# Patient Record
Sex: Female | Born: 1965 | Race: White | Hispanic: No | Marital: Married | Smoking: Never smoker
Health system: Southern US, Community
[De-identification: ages and names within clinical notes are randomized; demographics above are authoritative.]

## PROBLEM LIST (undated history)

## (undated) ENCOUNTER — Emergency Department (HOSPITAL_BASED_OUTPATIENT_CLINIC_OR_DEPARTMENT_OTHER): Admission: EM | Payer: BC Managed Care – PPO | Source: Home / Self Care

## (undated) DIAGNOSIS — F1020 Alcohol dependence, uncomplicated: Secondary | ICD-10-CM

## (undated) DIAGNOSIS — F329 Major depressive disorder, single episode, unspecified: Secondary | ICD-10-CM

## (undated) DIAGNOSIS — D649 Anemia, unspecified: Secondary | ICD-10-CM

## (undated) DIAGNOSIS — F32A Depression, unspecified: Secondary | ICD-10-CM

## (undated) DIAGNOSIS — K589 Irritable bowel syndrome without diarrhea: Secondary | ICD-10-CM

## (undated) DIAGNOSIS — F319 Bipolar disorder, unspecified: Secondary | ICD-10-CM

## (undated) DIAGNOSIS — E785 Hyperlipidemia, unspecified: Secondary | ICD-10-CM

## (undated) DIAGNOSIS — K219 Gastro-esophageal reflux disease without esophagitis: Secondary | ICD-10-CM

## (undated) DIAGNOSIS — D099 Carcinoma in situ, unspecified: Secondary | ICD-10-CM

## (undated) DIAGNOSIS — T7840XA Allergy, unspecified, initial encounter: Secondary | ICD-10-CM

## (undated) DIAGNOSIS — F419 Anxiety disorder, unspecified: Secondary | ICD-10-CM

## (undated) DIAGNOSIS — E039 Hypothyroidism, unspecified: Secondary | ICD-10-CM

## (undated) DIAGNOSIS — F603 Borderline personality disorder: Secondary | ICD-10-CM

## (undated) HISTORY — PX: EYE SURGERY: SHX253

## (undated) HISTORY — DX: Alcohol dependence, uncomplicated: F10.20

## (undated) HISTORY — DX: Anemia, unspecified: D64.9

## (undated) HISTORY — DX: Borderline personality disorder: F60.3

## (undated) HISTORY — DX: Depression, unspecified: F32.A

## (undated) HISTORY — DX: Irritable bowel syndrome without diarrhea: K58.9

## (undated) HISTORY — PX: WISDOM TOOTH EXTRACTION: SHX21

## (undated) HISTORY — DX: Carcinoma in situ, unspecified: D09.9

## (undated) HISTORY — PX: FOOT SURGERY: SHX648

## (undated) HISTORY — PX: HERNIA REPAIR: SHX51

## (undated) HISTORY — DX: Bipolar disorder, unspecified: F31.9

## (undated) HISTORY — DX: Allergy, unspecified, initial encounter: T78.40XA

## (undated) HISTORY — PX: SQUAMOUS CELL CARCINOMA EXCISION: SHX2433

## (undated) HISTORY — DX: Gastro-esophageal reflux disease without esophagitis: K21.9

## (undated) HISTORY — DX: Hyperlipidemia, unspecified: E78.5

## (undated) HISTORY — DX: Anxiety disorder, unspecified: F41.9

## (undated) HISTORY — DX: Hypothyroidism, unspecified: E03.9

## (undated) HISTORY — PX: APPENDECTOMY: SHX54

## (undated) HISTORY — DX: Major depressive disorder, single episode, unspecified: F32.9

---

## 1997-06-04 ENCOUNTER — Ambulatory Visit (HOSPITAL_COMMUNITY): Admission: RE | Admit: 1997-06-04 | Discharge: 1997-06-04 | Payer: Self-pay | Admitting: Psychiatry

## 1997-12-27 ENCOUNTER — Other Ambulatory Visit: Admission: RE | Admit: 1997-12-27 | Discharge: 1997-12-27 | Payer: Self-pay | Admitting: Obstetrics and Gynecology

## 1998-03-26 ENCOUNTER — Inpatient Hospital Stay (HOSPITAL_COMMUNITY): Admission: AD | Admit: 1998-03-26 | Discharge: 1998-03-28 | Payer: Self-pay | Admitting: Psychiatry

## 1999-03-20 ENCOUNTER — Other Ambulatory Visit: Admission: RE | Admit: 1999-03-20 | Discharge: 1999-03-20 | Payer: Self-pay | Admitting: Obstetrics and Gynecology

## 1999-09-27 ENCOUNTER — Other Ambulatory Visit: Admission: RE | Admit: 1999-09-27 | Discharge: 1999-10-05 | Payer: Self-pay | Admitting: Psychiatry

## 2000-06-24 ENCOUNTER — Other Ambulatory Visit: Admission: RE | Admit: 2000-06-24 | Discharge: 2000-06-24 | Payer: Self-pay | Admitting: Obstetrics and Gynecology

## 2001-10-08 ENCOUNTER — Encounter (INDEPENDENT_AMBULATORY_CARE_PROVIDER_SITE_OTHER): Payer: Self-pay | Admitting: Specialist

## 2001-10-08 ENCOUNTER — Observation Stay (HOSPITAL_COMMUNITY): Admission: EM | Admit: 2001-10-08 | Discharge: 2001-10-08 | Payer: Self-pay | Admitting: Emergency Medicine

## 2001-10-08 ENCOUNTER — Encounter: Payer: Self-pay | Admitting: Emergency Medicine

## 2002-01-02 ENCOUNTER — Inpatient Hospital Stay (HOSPITAL_COMMUNITY): Admission: EM | Admit: 2002-01-02 | Discharge: 2002-01-05 | Payer: Self-pay | Admitting: Psychiatry

## 2002-05-06 ENCOUNTER — Other Ambulatory Visit: Admission: RE | Admit: 2002-05-06 | Discharge: 2002-05-06 | Payer: Self-pay | Admitting: Obstetrics and Gynecology

## 2003-09-15 ENCOUNTER — Other Ambulatory Visit: Admission: RE | Admit: 2003-09-15 | Discharge: 2003-09-15 | Payer: Self-pay | Admitting: Obstetrics and Gynecology

## 2004-04-21 ENCOUNTER — Ambulatory Visit: Payer: Self-pay | Admitting: Family Medicine

## 2005-01-22 ENCOUNTER — Other Ambulatory Visit: Admission: RE | Admit: 2005-01-22 | Discharge: 2005-01-22 | Payer: Self-pay | Admitting: Obstetrics and Gynecology

## 2005-10-17 ENCOUNTER — Ambulatory Visit: Payer: Self-pay | Admitting: Family Medicine

## 2005-11-12 ENCOUNTER — Ambulatory Visit: Payer: Self-pay | Admitting: Internal Medicine

## 2005-11-15 ENCOUNTER — Ambulatory Visit: Payer: Self-pay | Admitting: Cardiology

## 2005-12-07 ENCOUNTER — Ambulatory Visit: Payer: Self-pay | Admitting: Internal Medicine

## 2007-06-24 ENCOUNTER — Ambulatory Visit: Payer: Self-pay | Admitting: Family Medicine

## 2007-06-24 DIAGNOSIS — R197 Diarrhea, unspecified: Secondary | ICD-10-CM | POA: Insufficient documentation

## 2007-06-24 LAB — CONVERTED CEMR LAB: Tissue Transglutaminase Ab, IgA: 1.2 units (ref <7)

## 2007-07-03 ENCOUNTER — Encounter (INDEPENDENT_AMBULATORY_CARE_PROVIDER_SITE_OTHER): Payer: Self-pay | Admitting: *Deleted

## 2007-07-09 ENCOUNTER — Telehealth: Payer: Self-pay | Admitting: Family Medicine

## 2007-07-15 ENCOUNTER — Ambulatory Visit: Payer: Self-pay | Admitting: Family Medicine

## 2007-07-17 LAB — CONVERTED CEMR LAB
ALT: 13 units/L (ref 0–35)
AST: 16 units/L (ref 0–37)
Albumin: 3.7 g/dL (ref 3.5–5.2)
Alkaline Phosphatase: 38 units/L — ABNORMAL LOW (ref 39–117)
BUN: 11 mg/dL (ref 6–23)
Basophils Absolute: 0 10*3/uL (ref 0.0–0.1)
Basophils Relative: 0.5 % (ref 0.0–1.0)
Bilirubin, Direct: 0.1 mg/dL (ref 0.0–0.3)
CO2: 28 meq/L (ref 19–32)
Calcium: 9.7 mg/dL (ref 8.4–10.5)
Chloride: 101 meq/L (ref 96–112)
Creatinine, Ser: 0.8 mg/dL (ref 0.4–1.2)
Eosinophils Absolute: 0.1 10*3/uL (ref 0.0–0.7)
Eosinophils Relative: 1.7 % (ref 0.0–5.0)
GFR calc Af Amer: 102 mL/min
GFR calc non Af Amer: 84 mL/min
Glucose, Bld: 124 mg/dL — ABNORMAL HIGH (ref 70–99)
HCT: 36.4 % (ref 36.0–46.0)
Hemoglobin: 12.9 g/dL (ref 12.0–15.0)
Lymphocytes Relative: 27.6 % (ref 12.0–46.0)
MCHC: 35.4 g/dL (ref 30.0–36.0)
MCV: 94.4 fL (ref 78.0–100.0)
Monocytes Absolute: 0.3 10*3/uL (ref 0.1–1.0)
Monocytes Relative: 8.6 % (ref 3.0–12.0)
Neutro Abs: 2.5 10*3/uL (ref 1.4–7.7)
Neutrophils Relative %: 61.6 % (ref 43.0–77.0)
Platelets: 243 10*3/uL (ref 150–400)
Potassium: 4 meq/L (ref 3.5–5.1)
RBC: 3.85 M/uL — ABNORMAL LOW (ref 3.87–5.11)
RDW: 12.4 % (ref 11.5–14.6)
Sed Rate: 6 mm/hr (ref 0–22)
Sodium: 140 meq/L (ref 135–145)
TSH: 3.16 microintl units/mL (ref 0.35–5.50)
Total Bilirubin: 1 mg/dL (ref 0.3–1.2)
Total Protein: 6.9 g/dL (ref 6.0–8.3)
WBC: 4 10*3/uL — ABNORMAL LOW (ref 4.5–10.5)

## 2007-08-07 ENCOUNTER — Encounter: Payer: Self-pay | Admitting: Family Medicine

## 2007-08-12 LAB — HM MAMMOGRAPHY: HM Mammogram: NORMAL

## 2008-01-06 ENCOUNTER — Ambulatory Visit: Payer: Self-pay | Admitting: Family Medicine

## 2008-01-06 DIAGNOSIS — N760 Acute vaginitis: Secondary | ICD-10-CM | POA: Insufficient documentation

## 2008-01-06 LAB — CONVERTED CEMR LAB: KOH Prep: 9

## 2008-01-07 ENCOUNTER — Ambulatory Visit: Payer: Self-pay | Admitting: Family Medicine

## 2008-01-12 LAB — CONVERTED CEMR LAB
ALT: 14 units/L (ref 0–35)
AST: 14 units/L (ref 0–37)
Albumin: 3.7 g/dL (ref 3.5–5.2)
Alkaline Phosphatase: 42 units/L (ref 39–117)
BUN: 11 mg/dL (ref 6–23)
Basophils Absolute: 0 10*3/uL (ref 0.0–0.1)
Basophils Relative: 0 % (ref 0.0–3.0)
Bilirubin, Direct: 0.1 mg/dL (ref 0.0–0.3)
CO2: 31 meq/L (ref 19–32)
Calcium: 9.6 mg/dL (ref 8.4–10.5)
Chloride: 104 meq/L (ref 96–112)
Cholesterol: 184 mg/dL (ref 0–200)
Creatinine, Ser: 0.6 mg/dL (ref 0.4–1.2)
Eosinophils Absolute: 0.1 10*3/uL (ref 0.0–0.7)
Eosinophils Relative: 2.3 % (ref 0.0–5.0)
GFR calc Af Amer: 142 mL/min
GFR calc non Af Amer: 117 mL/min
Glucose, Bld: 82 mg/dL (ref 70–99)
HCT: 37.6 % (ref 36.0–46.0)
HDL: 61 mg/dL (ref >39.0)
Hemoglobin: 12.7 g/dL (ref 12.0–15.0)
LDL Cholesterol: 101 mg/dL — ABNORMAL HIGH (ref 0–99)
Lymphocytes Relative: 31.6 % (ref 12.0–46.0)
MCHC: 33.8 g/dL (ref 30.0–36.0)
MCV: 94.9 fL (ref 78.0–100.0)
Monocytes Absolute: 0.5 10*3/uL (ref 0.1–1.0)
Monocytes Relative: 9.7 % (ref 3.0–12.0)
Neutro Abs: 2.7 10*3/uL (ref 1.4–7.7)
Neutrophils Relative %: 56.4 % (ref 43.0–77.0)
Platelets: 211 10*3/uL (ref 150–400)
Potassium: 4.3 meq/L (ref 3.5–5.1)
RBC: 3.96 M/uL (ref 3.87–5.11)
RDW: 11.9 % (ref 11.5–14.6)
Sodium: 140 meq/L (ref 135–145)
TSH: 3.41 microintl units/mL (ref 0.35–5.50)
Total Bilirubin: 0.7 mg/dL (ref 0.3–1.2)
Total CHOL/HDL Ratio: 3
Total Protein: 7.1 g/dL (ref 6.0–8.3)
Triglycerides: 109 mg/dL (ref 0–149)
VLDL: 22 mg/dL (ref 0–40)
WBC: 4.8 10*3/uL (ref 4.5–10.5)

## 2008-01-27 ENCOUNTER — Encounter: Payer: Self-pay | Admitting: Family Medicine

## 2008-01-29 ENCOUNTER — Ambulatory Visit: Payer: Self-pay | Admitting: Family Medicine

## 2008-01-29 DIAGNOSIS — E663 Overweight: Secondary | ICD-10-CM | POA: Insufficient documentation

## 2008-01-29 DIAGNOSIS — R5383 Other fatigue: Secondary | ICD-10-CM

## 2008-01-29 DIAGNOSIS — R5381 Other malaise: Secondary | ICD-10-CM | POA: Insufficient documentation

## 2008-02-02 LAB — CONVERTED CEMR LAB
Folate: >20 ng/mL
Free T4: 0.7 ng/dL (ref 0.6–1.6)
T3, Free: 3.2 pg/mL (ref 2.3–4.2)
Vitamin B-12: 426 pg/mL (ref 211–911)

## 2008-02-03 LAB — CONVERTED CEMR LAB: Vit D, 1,25-Dihydroxy: 30 (ref 30–89)

## 2008-03-12 ENCOUNTER — Ambulatory Visit: Payer: Self-pay | Admitting: Family Medicine

## 2008-03-12 LAB — CONVERTED CEMR LAB
Free T4: 0.8 ng/dL (ref 0.6–1.6)
T3, Free: 2.9 pg/mL (ref 2.3–4.2)
TSH: 2.85 microintl units/mL (ref 0.35–5.50)

## 2008-08-23 ENCOUNTER — Ambulatory Visit: Payer: Self-pay | Admitting: Family Medicine

## 2008-08-23 DIAGNOSIS — B373 Candidiasis of vulva and vagina: Secondary | ICD-10-CM | POA: Insufficient documentation

## 2008-08-23 LAB — CONVERTED CEMR LAB: KOH Prep: POSITIVE

## 2009-04-20 ENCOUNTER — Encounter (INDEPENDENT_AMBULATORY_CARE_PROVIDER_SITE_OTHER): Payer: Self-pay | Admitting: *Deleted

## 2009-04-20 ENCOUNTER — Telehealth: Payer: Self-pay | Admitting: Gastroenterology

## 2009-04-20 ENCOUNTER — Ambulatory Visit: Payer: Self-pay | Admitting: Family Medicine

## 2009-04-21 ENCOUNTER — Encounter: Payer: Self-pay | Admitting: Physician Assistant

## 2009-04-21 ENCOUNTER — Ambulatory Visit: Payer: Self-pay | Admitting: Gastroenterology

## 2009-04-21 DIAGNOSIS — R1084 Generalized abdominal pain: Secondary | ICD-10-CM | POA: Insufficient documentation

## 2009-04-21 DIAGNOSIS — K219 Gastro-esophageal reflux disease without esophagitis: Secondary | ICD-10-CM

## 2009-04-21 DIAGNOSIS — F329 Major depressive disorder, single episode, unspecified: Secondary | ICD-10-CM | POA: Insufficient documentation

## 2009-04-21 DIAGNOSIS — R195 Other fecal abnormalities: Secondary | ICD-10-CM | POA: Insufficient documentation

## 2009-04-21 DIAGNOSIS — F411 Generalized anxiety disorder: Secondary | ICD-10-CM | POA: Insufficient documentation

## 2009-04-21 DIAGNOSIS — R109 Unspecified abdominal pain: Secondary | ICD-10-CM | POA: Insufficient documentation

## 2009-04-21 HISTORY — DX: Gastro-esophageal reflux disease without esophagitis: K21.9

## 2009-04-21 HISTORY — DX: Generalized anxiety disorder: F41.1

## 2009-04-22 LAB — CONVERTED CEMR LAB
Basophils Absolute: 0 10*3/uL (ref 0.0–0.1)
Basophils Relative: 1 % (ref 0.0–3.0)
CRP, High Sensitivity: 0.4 (ref 0.00–5.00)
Eosinophils Absolute: 0.1 10*3/uL (ref 0.0–0.7)
Eosinophils Relative: 2.8 % (ref 0.0–5.0)
HCT: 38 % (ref 36.0–46.0)
Hemoglobin: 12.6 g/dL (ref 12.0–15.0)
Lymphocytes Relative: 42.2 % (ref 12.0–46.0)
Lymphs Abs: 1.8 10*3/uL (ref 0.7–4.0)
MCHC: 33.2 g/dL (ref 30.0–36.0)
MCV: 98.2 fL (ref 78.0–100.0)
Monocytes Absolute: 0.4 10*3/uL (ref 0.1–1.0)
Monocytes Relative: 10.7 % (ref 3.0–12.0)
Neutro Abs: 1.7 10*3/uL (ref 1.4–7.7)
Neutrophils Relative %: 43.3 % (ref 43.0–77.0)
Platelets: 218 10*3/uL (ref 150.0–400.0)
RBC: 3.87 M/uL (ref 3.87–5.11)
RDW: 12.1 % (ref 11.5–14.6)
WBC: 4 10*3/uL — ABNORMAL LOW (ref 4.5–10.5)

## 2009-04-25 LAB — CONVERTED CEMR LAB: Tissue Transglutaminase Ab, IgA: 1.1 units (ref <7)

## 2009-05-03 ENCOUNTER — Ambulatory Visit: Payer: Self-pay | Admitting: Gastroenterology

## 2009-05-03 HISTORY — PX: UPPER GASTROINTESTINAL ENDOSCOPY: SHX188

## 2009-05-03 HISTORY — PX: COLONOSCOPY: SHX174

## 2009-05-03 LAB — HM COLONOSCOPY

## 2009-05-05 ENCOUNTER — Encounter: Payer: Self-pay | Admitting: Gastroenterology

## 2009-05-11 ENCOUNTER — Ambulatory Visit: Payer: Self-pay | Admitting: Family Medicine

## 2009-05-11 ENCOUNTER — Telehealth: Payer: Self-pay | Admitting: Gastroenterology

## 2009-05-17 ENCOUNTER — Ambulatory Visit: Payer: Self-pay | Admitting: Gastroenterology

## 2009-05-17 DIAGNOSIS — K589 Irritable bowel syndrome without diarrhea: Secondary | ICD-10-CM | POA: Insufficient documentation

## 2009-05-17 LAB — CONVERTED CEMR LAB
ALT: 13 units/L (ref 0–35)
AST: 14 units/L (ref 0–37)
Albumin: 3.7 g/dL (ref 3.5–5.2)
Alkaline Phosphatase: 57 units/L (ref 39–117)
BUN: 9 mg/dL (ref 6–23)
Basophils Absolute: 0 10*3/uL (ref 0.0–0.1)
Basophils Relative: 0.5 % (ref 0.0–3.0)
Bilirubin, Direct: 0 mg/dL (ref 0.0–0.3)
CO2: 32 meq/L (ref 19–32)
Calcium: 9 mg/dL (ref 8.4–10.5)
Chloride: 101 meq/L (ref 96–112)
Creatinine, Ser: 0.5 mg/dL (ref 0.4–1.2)
Eosinophils Absolute: 0.2 10*3/uL (ref 0.0–0.7)
Eosinophils Relative: 4.3 % (ref 0.0–5.0)
Ferritin: 24 ng/mL (ref 10.0–291.0)
Folate: 17.3 ng/mL
GFR calc non Af Amer: 142.95 mL/min (ref >60)
Glucose, Bld: 88 mg/dL (ref 70–99)
HCT: 34.7 % — ABNORMAL LOW (ref 36.0–46.0)
Hemoglobin: 12.1 g/dL (ref 12.0–15.0)
Iron: 37 ug/dL — ABNORMAL LOW (ref 42–145)
Lymphocytes Relative: 42.7 % (ref 12.0–46.0)
Lymphs Abs: 2.1 10*3/uL (ref 0.7–4.0)
MCHC: 34.9 g/dL (ref 30.0–36.0)
MCV: 96 fL (ref 78.0–100.0)
Monocytes Absolute: 0.5 10*3/uL (ref 0.1–1.0)
Monocytes Relative: 9.8 % (ref 3.0–12.0)
Neutro Abs: 2.1 10*3/uL (ref 1.4–7.7)
Neutrophils Relative %: 42.7 % — ABNORMAL LOW (ref 43.0–77.0)
Platelets: 256 10*3/uL (ref 150.0–400.0)
Potassium: 3.7 meq/L (ref 3.5–5.1)
RBC: 3.61 M/uL — ABNORMAL LOW (ref 3.87–5.11)
RDW: 12.7 % (ref 11.5–14.6)
Saturation Ratios: 10 % — ABNORMAL LOW (ref 20.0–50.0)
Sodium: 139 meq/L (ref 135–145)
TSH: 3.04 microintl units/mL (ref 0.35–5.50)
Total Bilirubin: 0.2 mg/dL — ABNORMAL LOW (ref 0.3–1.2)
Total Protein: 6.7 g/dL (ref 6.0–8.3)
Transferrin: 265.1 mg/dL (ref 212.0–360.0)
Vitamin B-12: 532 pg/mL (ref 211–911)
WBC: 5 10*3/uL (ref 4.5–10.5)

## 2009-05-18 ENCOUNTER — Encounter: Payer: Self-pay | Admitting: Gastroenterology

## 2009-05-31 ENCOUNTER — Ambulatory Visit: Payer: Self-pay | Admitting: Gastroenterology

## 2010-03-16 NOTE — Letter (Signed)
Summary: [facility] Biopsy Results  [facility]  [address] [place], [place] [address]   Phone: [phone]  Fax: [phone]        May 05, 2009 MRN: [id number]    [facility] [address] [place], [place]  [address]    Dear Diana Decker,  I am pleased to inform you that the biopsies taken during your recent endoscopic examination did not show any evidence of cancer upon pathologic examination.  Additional information/recommendations:  __No further action is needed at this time.  Please follow-up with      your primary care physician for your other healthcare needs.  _xx_ Please call [phone] to schedule a return visit to review      your condition.  __ Continue with the treatment plan as outlined on the day of your      exam.  __ You should have a repeat endoscopic examination for this problem              in _ months/years.   Please call us if you are having persistent problems or have questions about your condition that have not been fully answered at this time.  Sincerely,  Mardella Layman MD [facility]  This letter has been electronically signed by your physician.  Appended Document: Patient Notice-Endo Biopsy Results Letter mailed 3.25.11

## 2010-03-16 NOTE — Assessment & Plan Note (Signed)
Summary: 2 WK F/U-ALP   History of Present Illness Visit Type: Follow-up Visit Primary GI MD: Sheryn Bison MD FACP FAGA Primary Provider: Kerby Nora, MD  Requesting Provider: n/a Chief Complaint: 2 wk f/u with diarrhea. Pt has changed her diet which has improved her symptoms. Pt is still taking the Align and when she eats the wrong food she will have more diarrhea.  History of Present Illness:   All of her stool exams were normal. She currently is on a low-fat diet with probiotic therapy and has had marked improvement in her diarrhea. She gets constipated she uses Imodium or Levsin. I gave her information concerning Lotronex to review and she does not want to take this medication.   GI Review of Systems    Reports abdominal pain.     Location of  Abdominal pain: right side.    Denies acid reflux, belching, bloating, chest pain, dysphagia with liquids, dysphagia with solids, heartburn, loss of appetite, nausea, vomiting, vomiting blood, weight loss, and  weight gain.      Reports diarrhea.     Denies anal fissure, black tarry stools, change in bowel habit, constipation, diverticulosis, fecal incontinence, heme positive stool, hemorrhoids, irritable bowel syndrome, jaundice, light color stool, liver problems, rectal bleeding, and  rectal pain.    Current Medications (verified): 1)  Depakote 500 Mg  Tbec (Divalproex Sodium) .... Take 1 Tablet By Mouth Once A Day 2)  Lamictal 25 Mg  Tbdp (Lamotrigine) .... Take 1 Tablet By Mouth Once A Day 3)  Valtrex 1 Gm  Tabs (Valacyclovir Hcl) .... Takes 1/2 By Mouth Once Daily 4)  Multivitamins   Tabs (Multiple Vitamin) .... Take 1 Tablet By Mouth Once A Day 5)  Serzone 200 Mg. .... Take 1 Tablet By Mouth Once A Day 6)  Hyoscyamine Sulfate 0.125 Mg Tabs (Hyoscyamine Sulfate) .Marland Kitchen.. 1 By Mouth 4 Times Daily As Needed Abdominal Discomfort / Diarrhea 7)  Align  Caps (Probiotic Product) .... One Capsule By Mouth Once Daily  Allergies (verified): No  Known Drug Allergies  Past History:  Past medical, surgical, family and social histories (including risk factors) reviewed for relevance to current acute and chronic problems.  Past Medical History: Reviewed history from 04/21/2009 and no changes required. Bipolar Disorder 1994 Borderline Personality Disorder 1997 Anemia 2000 IBS GERD, ocassional Alcoholism Anemia Anxiety Disorder  Past Surgical History: Reviewed history from 08/28/2007 and no changes required. Appendectomy Inguinal hernia repair  Family History: Reviewed history from 04/21/2009 and no changes required. Breast Cancer-Mother MOTHER ;CELAC DISEASE Adenomatous colon Polyps-Father--and prostate cancer, peripherial neuropathy Family History of Heart Disease: Father No FH of Colon Cancer:  Social History: Reviewed history from 01/06/2008 and no changes required. Never Smoked Alcohol use-yes Regular exercise-yes Marital Status: Married--second marriage Children: no biological children, 2 step Occupation: Teaching laboratory technician artist--photophrography, [employer]  Review of Systems  The patient denies allergy/sinus, anemia, anxiety-new, arthritis/joint pain, back pain, blood in urine, breast changes/lumps, change in vision, confusion, cough, coughing up blood, depression-new, fainting, fatigue, fever, headaches-new, hearing problems, heart murmur, heart rhythm changes, itching, menstrual pain, muscle pains/cramps, night sweats, nosebleeds, pregnancy symptoms, shortness of breath, skin rash, sleeping problems, sore throat, swelling of feet/legs, swollen lymph glands, thirst - excessive , urination - excessive , urination changes/pain, urine leakage, vision changes, and voice change.    Vital Signs:  Patient profile:   45 year old female Height:      67 inches Weight:      185.38 pounds BMI:  29.14 Pulse rate:   70 / minute Pulse rhythm:   regular BP sitting:   116 / 72  (right arm) Cuff size:    regular  Vitals Entered By: Christie Nottingham CMA Duncan Dull) (May 31, 2009 4:10 PM)  Physical Exam  General:  Well developed, well nourished, no acute distress.healthy appearing.   Head:  Normocephalic and atraumatic. Eyes:  PERRLA, no icterus.exam deferred to patient's ophthalmologist.   Abdomen:  Soft, nontender and nondistended. No masses, hepatosplenomegaly or hernias noted. Normal bowel sounds. Psych:  Alert and cooperative. Normal mood and affect.   Impression & Recommendations:  Problem # 1:  IBS (ICD-564.1) Assessment Improved Continue probiotic therapy and low fat nutritious diet as tolerated. I do not think further GI workup at this time indicated. She has seen our patient education video on IBS and its management. Other considerations would be a two-week course of Xifaxan therapy for possible bacterial overgrowth syndrome.  Problem # 2:  ANXIETY (ICD-300.00) Assessment: Improved  Problem # 3:  DIARRHEA, CHRONIC (ICD-787.91) Assessment: Improved  Patient Instructions: 1)  Begin [facility] Colon Health daily. 2)  Please continue current medications.  3)  The medication list was reviewed and reconciled.  All changed / newly prescribed medications were explained.  A complete medication list was provided to the patient / caregiver. 4)  IBS brochure given.  5)  Copy sent to : Dr. Kerby Nora  Appended Document: 2 WK F/U-ALP    Clinical Lists Changes  Medications: Changed medication from ALIGN  CAPS (PROBIOTIC PRODUCT) one capsule by mouth once daily to [facility] COLON HEALTH  CAPS (PROBIOTIC PRODUCT) two times a day

## 2010-03-16 NOTE — Letter (Signed)
Summary: [facility] Instructions  [facility]  [address] [place], [place] [address]   Phone: [phone]  Fax: [phone]       Diana Decker    02/05/1966    MRN: [id number]        Procedure Day /Date: 05-03-09     Arrival Time: 9:30 AM      Procedure Time:10:30 AM     Location of Procedure:                    X    [facility] (4th Floor)                     PREPARATION FOR COLONOSCOPY WITH MOVIPREP   Starting 5 days prior to your procedure 04-28-09  do not eat nuts, seeds, popcorn, corn, beans, peas,  salads, or any raw vegetables.  Do not take any fiber supplements (e.g. Metamucil, Citrucel, and Benefiber).  THE DAY BEFORE YOUR PROCEDURE         DATE: 05-02-09  DAY: Monday  1.  Drink clear liquids the entire day-NO SOLID FOOD  2.  Do not drink anything colored red or purple.  Avoid juices with pulp.  No orange juice.  3.  Drink at least 64 oz. (8 glasses) of fluid/clear liquids during the day to prevent dehydration and help the prep work efficiently.  CLEAR LIQUIDS INCLUDE: Water Jello Ice Popsicles Tea (sugar ok, no milk/cream) Powdered fruit flavored drinks Coffee (sugar ok, no milk/cream) Gatorade Juice: apple, white grape, white cranberry  Lemonade Clear bullion, consomm, broth Carbonated beverages (any kind) Strained chicken noodle soup Hard Candy                             4.  In the morning, mix first dose of MoviPrep solution:    Empty 1 Pouch A and 1 Pouch B into the disposable container    Add lukewarm drinking water to the top line of the container. Mix to dissolve    Refrigerate (mixed solution should be used within 24 hrs)  5.  Begin drinking the prep at 5:00 p.m. The MoviPrep container is divided by 4 marks.   Every 15 minutes drink the solution down to the next mark (approximately 8 oz) until the full liter is complete.   6.  Follow completed prep with 16 oz of clear liquid of your choice (Nothing red or  purple).  Continue to drink clear liquids until bedtime.  7.  Before going to bed, mix second dose of MoviPrep solution:    Empty 1 Pouch A and 1 Pouch B into the disposable container    Add lukewarm drinking water to the top line of the container. Mix to dissolve    Refrigerate  THE DAY OF YOUR PROCEDURE      DATE: 05-03-09 DAY: Tuesday  Beginning at 5:30 AM (5 hours before procedure):         1. Every 15 minutes, drink the solution down to the next mark (approx 8 oz) until the full liter is complete.  2. Follow completed prep with 16 oz. of clear liquid of your choice.    3. You may drink clear liquids until 8:30 AM (2 HOURS BEFORE PROCEDURE).   MEDICATION INSTRUCTIONS  Unless otherwise instructed, you should take regular prescription medications with a small sip of water   as early as possible the morning of your procedure.  OTHER INSTRUCTIONS  You will need a responsible adult at least 44 years of age to accompany you and drive you home.   This person must remain in the waiting room during your procedure.  Wear loose fitting clothing that is easily removed.  Leave jewelry and other valuables at home.  However, you may wish to bring a book to read or  an iPod/MP3 player to listen to music as you wait for your procedure to start.  Remove all body piercing jewelry and leave at home.  Total time from sign-in until discharge is approximately 2-3 hours.  You should go home directly after your procedure and rest.  You can resume normal activities the  day after your procedure.  The day of your procedure you should not:   Drive   Make legal decisions   Operate machinery   Drink alcohol   Return to work  You will receive specific instructions about eating, activities and medications before you leave.    The above instructions have been reviewed and explained to me by   _______________________    I fully understand and can verbalize these instructions  _____________________________ Date _________

## 2010-03-16 NOTE — Letter (Signed)
Summary: New Patient letter  [facility]  [address] [place], [place] [address]   Phone: [phone]  Fax: [phone]       04/20/2009 MRN: [id number]  [facility] [address] [place], [place]  [address]  Dear Diana Decker,  Welcome to the Gastroenterology Division at [facility].    You are scheduled to see Dr.  Russella Dar on 05-23-09 at  10:15AM on the 3rd floor at [facility], [address]  We ask that you try to arrive at our office 15 minutes prior to your appointment time to allow for check-in.  We would like you to complete the enclosed self-administered evaluation form prior to your visit and bring it with you on the day of your appointment.  We will review it with you.  Also, please bring a complete list of all your medications or, if you prefer, bring the medication bottles and we will list them.  Please bring your insurance card so that we may make a copy of it.  If your insurance requires a referral to see a specialist, please bring your referral form from your primary care physician.  Co-payments are due at the time of your visit and may be paid by cash, check or credit card.     Your office visit will consist of a consult with your physician (includes a physical exam), any laboratory testing he/she may order, scheduling of any necessary diagnostic testing (e.g. x-ray, ultrasound, CT-scan), and scheduling of a procedure (e.g. Endoscopy, Colonoscopy) if required.  Please allow enough time on your schedule to allow for any/all of these possibilities.    If you cannot keep your appointment, please call [phone] to cancel or reschedule prior to your appointment date.  This allows [place] the opportunity to schedule an appointment for another patient in need of care.  If you do not cancel or reschedule by 5 p.m. the business day prior to your appointment date, you will be charged a $50.00 late cancellation/no-show fee.    Thank you for  choosing [facility] for your medical needs.  We appreciate the opportunity to care for you.  Please visit [place] at our website  to learn more about our practice.                     Sincerely,                                                             The Gastroenterology Division

## 2010-03-16 NOTE — Assessment & Plan Note (Signed)
Summary: Diarrhea,abd pain/dfs   History of Present Illness Visit Type: new patient  Primary GI MD: Sheryn Bison MD FACP FAGA Primary Provider: Kerby Nora, MD  Requesting Provider: n/a Chief Complaint: Diarrhea, bloating,  and lower abd pain  History of Present Illness:   45 YO FEMALE ,NEW TODAY. SHE REPORTS HAVING SEEN A GI MD  SEVERAL YEARS AGO,WAS TO HAVE A COLONOSCOPY BUT DID NOT FOLLOW THRU. SHE HAS HAD ONGOING PROBLEMS WITH DIARRHEA-FREQUENT,LOOSE STOOLS USUALLY 6-7 BMS/DAY,AGGRAVATED BY by mouth INTAKE.OVER THIS PAST WEEK SHE HAS DEVELOPED SHARP,CRAMPY MID ABDOMINAL PAINS WHICH ARE NEW. NO FEVER, NO N/V. APPETITE OK, WEIGHT STABLE. SHE ALSO RELATES CHRONIC PROBLEMS WITH BLOATING. HER LOOSE STOOLS HAVE BEEN PRESENT FOR A COUPLE OF YEARS. HER MOTHER HAS CELIAC DISEASE. PT SAYS SHE HAD SOME MARKERS DONE  A FEW YEARS AGO, AND ONE WAS POSITIVE-SHE TRIED A GLUTEN FREE DIET SHORT TERM AND DID FEEL BETTER THOUGH NOT DRAMATICALLY SO-AND DID NOT STICK WITH IT.   GI Review of Systems    Reports abdominal pain and  bloating.     Location of  Abdominal pain: lower abdomen.    Denies acid reflux, belching, chest pain, dysphagia with liquids, dysphagia with solids, heartburn, loss of appetite, nausea, vomiting, vomiting blood, and  weight loss.      Reports diarrhea.     Denies anal fissure, black tarry stools, constipation, fecal incontinence, heme positive stool, hemorrhoids, irritable bowel syndrome, jaundice, light color stool, liver problems, rectal bleeding, and  rectal pain.    Current Medications (verified): 1)  Depakote 500 Mg  Tbec (Divalproex Sodium) .... Take 1 Tablet By Mouth Once A Day 2)  Lamictal 25 Mg  Tbdp (Lamotrigine) .... Take 1 Tablet By Mouth Once A Day 3)  Valtrex 1 Gm  Tabs (Valacyclovir Hcl) .... Takes 1/2 By Mouth Once Daily 4)  Multivitamins   Tabs (Multiple Vitamin) .... Take 1 Tablet By Mouth Once A Day 5)  Serzone 200 Mg. .... Take 1 Tablet By Mouth Once A Day 6)   Hyoscyamine Sulfate 0.125 Mg Tabs (Hyoscyamine Sulfate) .Marland Kitchen.. 1 By Mouth 4 Times Daily As Needed Abdominal Discomfort / Diarrhea  Allergies (verified): No Known Drug Allergies  Past History:  Past Medical History: Bipolar Disorder 1994 Borderline Personality Disorder 1997 Anemia 2000 IBS GERD, ocassional Alcoholism Anemia Anxiety Disorder  Past Surgical History: Reviewed history from 08/28/2007 and no changes required. Appendectomy Inguinal hernia repair  Family History: Breast Cancer-Mother MOTHER ;CELAC DISEASE Adenomatous colon Polyps-Father--and prostate cancer, peripherial neuropathy Family History of Heart Disease: Father No FH of Colon Cancer:  Social History: Reviewed history from 01/06/2008 and no changes required. Never Smoked Alcohol use-yes Regular exercise-yes Marital Status: Married--second marriage Children: no biological children, 2 step Occupation: Teaching laboratory technician artist--photophrography, [employer]  Review of Systems       The patient complains of fatigue and menstrual pain.  The patient denies allergy/sinus, anemia, anxiety-new, arthritis/joint pain, back pain, blood in urine, breast changes/lumps, change in vision, confusion, cough, coughing up blood, depression-new, fainting, fever, headaches-new, hearing problems, heart murmur, heart rhythm changes, itching, muscle pains/cramps, night sweats, nosebleeds, pregnancy symptoms, shortness of breath, skin rash, sleeping problems, sore throat, swelling of feet/legs, swollen lymph glands, thirst - excessive , urination - excessive , urination changes/pain, urine leakage, vision changes, and voice change.         ROS OTHERWISE AS IN HPI  Vital Signs:  Patient profile:   45 year old female Height:      67 inches  Weight:      182 pounds BMI:     28.61 BSA:     1.94 Temp:     98.4 degrees F oral Pulse rate:   74 / minute Pulse rhythm:   regular BP sitting:   116 / 64  (left arm) Cuff size:    regular  Vitals Entered By: Ok Anis CMA (April 21, 2009 9:37 AM)  Physical Exam  General:  Well developed, well nourished, no acute distress. Head:  Normocephalic and atraumatic. Eyes:  PERRLA, no icterus. Lungs:  Clear throughout to auscultation. Heart:  Regular rate and rhythm; no murmurs, rubs,  or bruits. Abdomen:  SOFT, TENDER MID ABDOMEN,LEFT AND RIGHT, BS++,NO PALP MASS OR HSM,NO GUARDING Rectal:  HEME POSITIVE 1+ Extremities:  No clubbing, cyanosis, edema or deformities noted. Neurologic:  Alert and  oriented x4;  grossly normal neurologically. Psych:  Alert and cooperative. Normal mood and affect.   Impression & Recommendations:  Problem # 1:  ABDOMINAL PAIN, GENERALIZED (ICD-789.07) Assessment New 45 YO FEMALE WITH ONE WEEK HX OF CRAMPY SHARP MID ABDOMINAL PAIN,IN SETTING OF CHRONIC DIARRHEA,HX OF ANEMIA,FAMILY HX OF CELIAC DISEASE  R/O CELIAC DISEASE,R/O IBD,R/O IBS  GIVEN CHRONICITY OF SXS-SHE NEEDS EVALUATION WITH COLONOSCOPY, AND WILL SCHEDULE FOR EGD WITH SMALL BOWEL BX AT SAME TIME SO CAN MOVE FORWARD WITH DX.PROCEDURES DISCUSSED IN DETAIL WITH PT. START ALIGN ONE DAILY SHE JUST STARTED HYOSCYAMINE YESTERDAY PER PRIMRY ,CONITUE 3-4 X DAILY LABS AS BELOW Orders: T-Sprue Panel (Celiac Disease Aby Eval) ([id number]) T-Tissue Transglutamase Ab IgA ([id number]) Colon/Endo (Colon/Endo) TLB-CRP-High Sensitivity (C-Reactive Protein) (86140-FCRP) TLB-CBC Platelet - w/Differential ([id number])  Problem # 2:  ANXIETY (ICD-300.00) Assessment: Comment Only  Problem # 3:  DEPRESSION (ICD-311) Assessment: Comment Only BIPOLAR  Patient Instructions: 1)  Take 1 Align Capsule Probiotic daily for 2 months. You can get this at your pharamcy, [facility], [employer], [place]. 2)  We scheduled the Endoscopy/Colonoscopy  with Dr. Jarold Motto 05-03-09 at 10:30 Am.  3)  [facility] Patient Information Guide given to patient. 4)  We sent your perscription to your  pharmacy for the Colonoscopy. 5)  Copy sent to : Kerby Nora, MD 6)  The medication list was reviewed and reconciled.  All changed / newly prescribed medications were explained.  A complete medication list was provided to the patient / caregiver. Prescriptions: MOVIPREP 100 GM  SOLR (PEG-KCL-NACL-NASULF-NA ASC-C) As per prep instructions.  #1 x 0   Entered by:   Lowry Ram NCMA   Authorized by:   Sammuel Cooper PA-c   Signed by:   Lowry Ram NCMA on 04/21/2009   Method used:   Electronically to        [facility]  [phone]* (retail)       [address] [place], [place]  [address]       Ph: [phone] or [phone]       Fax: [phone]   RxID:   [id number]

## 2010-03-16 NOTE — Assessment & Plan Note (Signed)
Summary: CPX/CLE   Vital Signs:  Patient Profile:   45 Years Old Female Height:     67 inches Weight:      199.50 pounds Temp:     98.1 degrees F oral Pulse rate:   75 / minute BP sitting:   112 / 76  (left arm) Cuff size:   regular  Vitals Entered By: Windell Norfolk (January 06, 2008 2:34 PM)                 Chief Complaint:  cpx.  History of Present Illness: Here for CPX --no pap --pap 1/09--[facility]--neg, had abn pap  ~26yr ago, now q22mo, all neg since one abn --mammo --6/09--neg, no colonoscopy--has seen GI due to abd pains--thinks is stress 3-6 mo ago, better now --Tetnus--unknown--willl update  Having itching in pelvic area, minimal discharge, slightly fishy odor  Seeing Dr Evelene Croon, psych for bipolar. --is aware of wt gain, likes sweets.  Has done [employer] in the past.      Prior Medication List:  DEPAKOTE 500 MG  TBEC (DIVALPROEX SODIUM) Take 1 tablet by mouth once a day * SERZONE   ? MG. Take 1 tablet by mouth once a day LAMICTAL 25 MG  TBDP (LAMOTRIGINE) Take 1 tablet by mouth once a day VALTREX 1 GM  TABS (VALACYCLOVIR HCL) Takes 1/2 by mouth once daily MULTIVITAMINS   TABS (MULTIPLE VITAMIN) Take 1 tablet by mouth once a day   Current Allergies (reviewed today): No known allergies   Past Medical History:    Reviewed history from 08/28/2007 and no changes required:       Bipolar Disorder 1994       Borderline Personality Disorder 1997       Anemia 2000       IBS       GERD, ocassional   Family History:    Breast Cancer-MotherA    Adenomatous colon Polyps-Father--and prostate cancer, peripherial neuropathy  Social History:    Never Smoked    Alcohol use-yes    Regular exercise-yes    Marital Status: Married--second marriage    Children: no biological children, 2 step    Occupation: Teaching laboratory technician artist--photophrography, [employer]   Risk Factors:  Tobacco use:  never Passive smoke exposure:  no Drug use:  no HIV high-risk  behavior:  no Caffeine use:  3 drinks per day Alcohol use:  no Exercise:  no Seatbelt use:  100 % Sun Exposure:  rarely  Mammogram History:    Date of Last Mammogram:  08/06/2007   Review of Systems  Eyes      eye doc 5/09--new Rx, no glaucoma or cats  CV      Denies chest pain or discomfort, shortness of breath with exertion, swelling of feet, and swelling of hands.  Resp      Denies cough, shortness of breath, and wheezing.  GI      See HPI  GU      Complains of discharge and genital sores.      Denies abnormal vaginal bleeding, decreased libido, incontinence, and nocturia.      known genital herpes  MS      Complains of joint pain.      Denies muscle aches.      L knee--old scoccor injury  Derm      Denies lesion(s) and rash.  Neuro      Denies difficulty with concentration, disturbances in coordination, falling down, memory loss, tremors, and weakness.  Psych  See HPI      Complains of anxiety and depression.   Physical Exam  General:     alert, well-developed, well-nourished, and well-hydrated.   Eyes:     glasses Neck:     no masses, no thyromegaly, no JVD, and normal carotid upstroke.   Lungs:     normal respiratory effort, no intercostal retractions, no accessory muscle use, and normal breath sounds.   Heart:     normal rate, regular rhythm, and no murmur.   Abdomen:     soft, non-tender, normal bowel sounds, no distention, no masses, no guarding, no abdominal hernia, no inguinal hernia, no hepatomegaly, and no splenomegaly.   Genitalia:     normal introitus and no external lesions.  blind specimen obtained Msk:     no joint tenderness, no joint swelling, no joint warmth, no redness over joints, and no joint deformities.   Pulses:     R posterior tibial normal, R dorsalis pedis normal, L femoral normal, and L popliteal normal.   Extremities:     no edema in either lower legs Neurologic:     alert & oriented X3, strength normal in all  extremities, sensation intact to light touch, gait normal, and DTRs symmetrical and normal.   Skin:     turgor normal, color normal, and no rashes.   Inguinal Nodes:     no R inguinal adenopathy and no L inguinal adenopathy.   Psych:     normally interactive.      Impression & Recommendations:  Problem # 1:  WELL ADULT EXAM (ICD-V70.0) well 45 yr old female will schedule in for fasting labs Tdap given to update immunizations  Problem # 2:  VAGINITIS, BACTERIAL (ICD-616.10) Assessment: New wet prep with minimal bacteria and rare WBC--discussed possible tx--will try plain yogart two times a day to vulvul area  see back or call if not improved Orders: Wet Prep ([id number])   Complete Medication List: 1)  Depakote 500 Mg Tbec (Divalproex sodium) .... Take 1 tablet by mouth once a day 2)  Serzone ? Mg.  .... Take 1 tablet by mouth once a day 3)  Lamictal 25 Mg Tbdp (Lamotrigine) .... Take 1 tablet by mouth once a day 4)  Valtrex 1 Gm Tabs (Valacyclovir hcl) .... Takes 1/2 by mouth once daily 5)  Multivitamins Tabs (Multiple vitamin) .... Take 1 tablet by mouth once a day  Other Orders: Tdap => 58yrs IM (13086) Admin 1st Vaccine (57846)   Patient Instructions: 1)  schedule in for fasting labs:  [place] panel and lipids---v70.0   ]  Tetanus/Td Vaccine    Vaccine Type: Tdap    Site: left deltoid    Mfr: GlaxoSmithKline    Dose: 0.5 ml    Route: IM    Given by: Windell Norfolk    Exp. Date: 01/16/2010    Lot #: [id number]    VIS given: 12/31/06 version given January 06, 2008.    Laboratory Results    Wet Mount/KOH Source: vagina WBC/hpf rare Bacteria/hpf rare Clue cells/hpf 0 Yeast/hpf 9 KOH 9 Trichomonas/hpf 0

## 2010-03-16 NOTE — Progress Notes (Signed)
Summary: triage  Phone Note Call from Patient Call back at [phone]   Caller: Patient Call For: Dr. Jarold Motto Reason for Call: Talk to Nurse Summary of Call: pt says she was told in a letter to return to the office for a f/u appt after ECL, no specific time frame given... offered pt next available in early May, but pt declined... pt asked what she was supposed to do until then and wanted to see Dr. Jarold Motto sooner... pt also wants a nurse to go over her results and what she can do to help her discomfort  Initial call taken by: Vallarie Mare,  May 11, 2009 3:48 PM  Follow-up for Phone Call        talked with pt.  Appt scheduled for 05/17/09 with Dr Jarold Motto. Follow-up by: Ashok Cordia RN,  May 11, 2009 3:58 PM

## 2010-03-16 NOTE — Assessment & Plan Note (Signed)
Summary: discuss endoscopy and conlonoscopy/alc   Vital Signs:  Patient profile:   45 year old female Height:      67 inches Weight:      184.6 pounds BMI:     29.02 Temp:     98.0 degrees F oral Pulse rate:   74 / minute Pulse rhythm:   regular BP sitting:   110 / 80  (left arm) Cuff size:   regular  Vitals Entered By: Benny Lennert CMA Duncan Dull) (May 11, 2009 3:24 PM)  History of Present Illness: Chief complaint discuss endo and colonoscopy  Pt upset no contact for GI office about reults of ENDO and colonoscopy....documentation inchart shows Dr. Norval Gable request for f/u appt and 3 phone calls and 2 letters sent.  Briefly reviwed results and recommended pt to call and make follow up appt with GI. pt agreeable. Copay returned. No charge.   Allergies (verified): No Known Drug Allergies   Complete Medication List: 1)  Depakote 500 Mg Tbec (Divalproex sodium) .... Take 1 tablet by mouth once a day 2)  Lamictal 25 Mg Tbdp (Lamotrigine) .... Take 1 tablet by mouth once a day 3)  Valtrex 1 Gm Tabs (Valacyclovir hcl) .... Takes 1/2 by mouth once daily 4)  Multivitamins Tabs (Multiple vitamin) .... Take 1 tablet by mouth once a day 5)  Serzone 200 Mg.  .... Take 1 tablet by mouth once a day 6)  Hyoscyamine Sulfate 0.125 Mg Tabs (Hyoscyamine sulfate) .Marland Kitchen.. 1 by mouth 4 times daily as needed abdominal discomfort / diarrhea  Other Orders: No Charge Patient Arrived (NCPA0) (NCPA0)  Current Allergies (reviewed today): No known allergies

## 2010-03-16 NOTE — Procedures (Signed)
Summary: Colonoscopy  Patient: Diana Decker Note: All result statuses are Final unless otherwise noted.  Tests: (1) Colonoscopy (COL)   COL Colonoscopy           [facility]     [address]     [place], [place]  [address]           COLONOSCOPY PROCEDURE REPORT           PATIENT:  Diana Decker, Diana Decker  MR#:  [id number]     BIRTHDATE:  1965-12-16, 44 yrs. old  GENDER:  female           ENDOSCOPIST:  Vania Rea. Jarold Motto, MD, [facility]     Referred by:           PROCEDURE DATE:  05/03/2009     PROCEDURE:  Colonoscopy with biopsy     ASA CLASS:  Class II     INDICATIONS:  abdominal pain, unexplained diarrhea           MEDICATIONS:   Fentanyl 50 mcg IV, Versed 5 mg IV           DESCRIPTION OF PROCEDURE:   After the risks benefits and     alternatives of the procedure were thoroughly explained, informed     consent was obtained.  Digital rectal exam was performed and     revealed no abnormalities.   The LB [id number] endoscope     was introduced through the anus and advanced to the terminal ileum     which was intubated for a short distance, without limitations.     The quality of the prep was excellent, using MiraLax.  The     instrument was then slowly withdrawn as the colon was fully     examined.<<PROCEDUREIMAGES>>           FINDINGS:  Abnormal appearing mucosa. NODULAR ILEUM BIOPSIED.  No     polyps or cancers were seen.  This was otherwise a normal     examination of the colon.   Retroflexed views in the rectum     revealed no abnormalities.    The scope was then withdrawn from     the patient and the procedure completed.           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 HERE C/W     IBS.     RECOMMENDATIONS:     1) Await biopsy results     2) Upper Endoscopy           REPEAT EXAM:  No         ______________________________     Vania Rea. Jarold Motto, MD, Clementeen Graham           CC:  Amy Michelle Nasuti, MD           n.     Rosalie DoctorVania Rea. Shakim Faith at 05/03/2009 11:09 AM           Sharnika, Binney, [phone]  Note: An exclamation mark (!) indicates a result that was not dispersed into the flowsheet. Document Creation Date: 05/03/2009 11:09 AM _______________________________________________________________________  (1) Order result status: Final Collection or observation date-time: 05/03/2009 11:02 Requested date-time:  Receipt date-time:  Reported date-time:  Referring Physician:   Ordering Physician: Sheryn Bison [phone]) Specimen Source:  Source: Diana Decker Order Number: [id number] Lab site:

## 2010-03-16 NOTE — Assessment & Plan Note (Signed)
Summary: Post Proc/dfs   History of Present Illness Primary GI MD: Sheryn Bison MD FACP FAGA Primary Provider: Kerby Nora, MD  Requesting Provider: n/a Chief Complaint: f/u after ECL, pt states she still having generalized abd pain, nausea, diarrhea. History of Present Illness:   Patient continues with crampy abdominal pain and diarrhea. Endoscopy, small bowel biopsy, and colonoscopy biopsies were all negative. She denies systemic complaints. She is on multiple psychotropic medications list and reviewed in her chart. She has a history of alcoholism, anxiety disorder, bipolar disorder, et Tera Mater.   GI Review of Systems    Reports abdominal pain, bloating, and  nausea.     Location of  Abdominal pain: generalized.    Denies acid reflux, belching, chest pain, dysphagia with liquids, dysphagia with solids, heartburn, loss of appetite, vomiting, vomiting blood, weight loss, and  weight gain.        Denies anal fissure, black tarry stools, change in bowel habit, constipation, diarrhea, diverticulosis, fecal incontinence, heme positive stool, hemorrhoids, irritable bowel syndrome, jaundice, light color stool, liver problems, rectal bleeding, and  rectal pain.    Current Medications (verified): 1)  Depakote 500 Mg  Tbec (Divalproex Sodium) .... Take 1 Tablet By Mouth Once A Day 2)  Lamictal 25 Mg  Tbdp (Lamotrigine) .... Take 1 Tablet By Mouth Once A Day 3)  Valtrex 1 Gm  Tabs (Valacyclovir Hcl) .... Takes 1/2 By Mouth Once Daily 4)  Multivitamins   Tabs (Multiple Vitamin) .... Take 1 Tablet By Mouth Once A Day 5)  Serzone 200 Mg. .... Take 1 Tablet By Mouth Once A Day 6)  Hyoscyamine Sulfate 0.125 Mg Tabs (Hyoscyamine Sulfate) .Marland Kitchen.. 1 By Mouth 4 Times Daily As Needed Abdominal Discomfort / Diarrhea  Allergies (verified): No Known Drug Allergies  Past History:  Past medical, surgical, family and social histories (including risk factors) reviewed for relevance to current acute  and chronic problems.  Past Medical History: Reviewed history from 04/21/2009 and no changes required. Bipolar Disorder 1994 Borderline Personality Disorder 1997 Anemia 2000 IBS GERD, ocassional Alcoholism Anemia Anxiety Disorder  Past Surgical History: Reviewed history from 08/28/2007 and no changes required. Appendectomy Inguinal hernia repair  Family History: Reviewed history from 04/21/2009 and no changes required. Breast Cancer-Mother MOTHER ;CELAC DISEASE Adenomatous colon Polyps-Father--and prostate cancer, peripherial neuropathy Family History of Heart Disease: Father No FH of Colon Cancer:  Social History: Reviewed history from 01/06/2008 and no changes required. Never Smoked Alcohol use-yes Regular exercise-yes Marital Status: Married--second marriage Children: no biological children, 2 step Occupation: Teaching laboratory technician artist--photophrography, [employer]  Review of Systems       The patient complains of fatigue.  The patient denies allergy/sinus, anemia, anxiety-new, arthritis/joint pain, back pain, blood in urine, breast changes/lumps, change in vision, confusion, cough, coughing up blood, depression-new, fainting, fever, headaches-new, hearing problems, heart murmur, heart rhythm changes, itching, menstrual pain, muscle pains/cramps, night sweats, nosebleeds, pregnancy symptoms, shortness of breath, skin rash, sleeping problems, sore throat, swelling of feet/legs, swollen lymph glands, thirst - excessive , urination - excessive , urination changes/pain, urine leakage, vision changes, and voice change.    Vital Signs:  Patient profile:   45 year old female Height:      67 inches Weight:      187 pounds BMI:     29.39 Pulse rate:   74 / minute Pulse rhythm:   regular BP sitting:   112 / 74  (right arm) Cuff size:   regular  Vitals Entered By:  Christie Nottingham CMA Duncan Dull) (May 17, 2009 4:08 PM)  Physical Exam  General:  Well developed, well nourished,  no acute distress.healthy appearing.   Head:  Normocephalic and atraumatic. Eyes:  PERRLA, no icterus.exam deferred to patient's ophthalmologist.   Abdomen:  Soft, nontender and nondistended. No masses, hepatosplenomegaly or hernias noted. Normal bowel sounds. Psych:  Alert and cooperative. Normal mood and affect.   Impression & Recommendations:  Problem # 1:  IBS (ICD-564.1) Assessment Improved We Will Complete Her Workup with further labs and stool exams. These are all unremarkable, and she continues to have significant diarrhea, we will consider Lotronex therapy a standard basis. She's been given printed material concerned latornex and its side effects and warnings to review. Orders: TLB-CBC Platelet - w/Differential (85025-CBCD) TLB-BMP (Basic Metabolic Panel-BMET) (80048-METABOL) TLB-Hepatic/Liver Function Pnl (80076-HEPATIC) TLB-TSH (Thyroid Stimulating Hormone) (84443-TSH) TLB-B12, Serum-Total ONLY (42706-C37) TLB-Ferritin (82728-FER) TLB-Folic Acid (Folate) (82746-FOL) TLB-IBC Pnl (Iron/FE;Transferrin) (83550-IBC) T-Beta Carotene [phone]) T-Giardia Lamblia IFA [phone]) T-Culture, Stool (87045/87046-70140) T-Stool Fats [place] Stain [phone]) T-Stool Giardia / Crypto- EIA (50093) T-Stool for O&P (81829-93716) T-Fecal WBC (96789-38101)  Problem # 2:  DEPRESSION (ICD-311) Assessment: Improved I surmise that she is under psychiatric care per the multiple medications she is on. This would seem to be a basic requirement before proceeding further with any interventional GI therapy.  Patient Instructions: 1)  Please go to the basement for lab work. 2)  Please continue current medications.  3)  Review the information on lotronex. 4)  Please schedule a follow-up appointment in 2 weeks.  5)  The medication list was reviewed and reconciled.  All changed / newly prescribed medications were explained.  A complete medication list was provided to the patient / caregiver. 6)   Copy sent to : Dr. Pattricia Boss. 7)  Please continue current medications.  8)  Please schedule a follow-up appointment in 3 weeks.  9)  Advised to stick with a low residue diet  avoiding food that can irritate bowel (see handout).

## 2010-03-16 NOTE — Assessment & Plan Note (Signed)
Summary: FEMALE PROBLEMS RASH?   Vital Signs:  Patient profile:   45 year old female Height:      67 inches Weight:      177.25 pounds BMI:     27.86 Temp:     98 degrees F oral Pulse rate:   72 / minute Pulse rhythm:   regular BP sitting:   92 / 60  (left arm) Cuff size:   regular  Vitals Entered By: Delilah Shan CMA (August 23, 2008 9:06 AM) CC: Female problems - Itchy, mostly external   History of Present Illness: Severe external and mild internal itching vaginally. 3-4 days. No specific discharge. Keeping her awake at night. No new exposures in soaps lotion, does not douche. No new sex toys, condoms.  Sexually active with husband.   Problems Prior to Update: 1)  Overweight  (ICD-278.02) 2)  Fatigue  (ICD-780.79) 3)  Vaginitis, Bacterial  (ICD-616.10) 4)  Well Adult Exam  (ICD-V70.0) 5)  Diarrhea, Chronic  (ICD-787.91)  Current Medications (verified): 1)  Depakote 500 Mg  Tbec (Divalproex Sodium) .... Take 1 Tablet By Mouth Once A Day 2)  Lamictal 25 Mg  Tbdp (Lamotrigine) .... Take 1 Tablet By Mouth Once A Day 3)  Valtrex 1 Gm  Tabs (Valacyclovir Hcl) .... Takes 1/2 By Mouth Once Daily 4)  Multivitamins   Tabs (Multiple Vitamin) .... Take 1 Tablet By Mouth Once A Day 5)  Serzone 200 Mg. .... Take 1 Tablet By Mouth Once A Day  Allergies (verified): No Known Drug Allergies  Past History:  Past medical, surgical, family and social histories (including risk factors) reviewed for relevance to current acute and chronic problems.  Past Medical History: Reviewed history from 08/28/2007 and no changes required. Bipolar Disorder 1994 Borderline Personality Disorder 1997 Anemia 2000 IBS GERD, ocassional  Past Surgical History: Reviewed history from 08/28/2007 and no changes required. Appendectomy Inguinal hernia repair  Family History: Reviewed history from 01/06/2008 and no changes required. Breast Cancer-MotherA Adenomatous colon Polyps-Father--and prostate  cancer, peripherial neuropathy  Social History: Reviewed history from 01/06/2008 and no changes required. Never Smoked Alcohol use-yes Regular exercise-yes Marital Status: Married--second marriage Children: no biological children, 2 step Occupation: Teaching laboratory technician artist--photophrography, [employer]  Review of Systems General:  Denies fever.  Physical Exam  General:  Well-developed,well-nourished,in no acute distress; alert,appropriate and cooperative throughout examination Abdomen:  Bowel sounds positive,abdomen soft and non-tender without masses, organomegaly or hernias noted. Genitalia:  white discharge, erythema externally on introitus   Impression & Recommendations:  Problem # 1:  VAGINITIS, CANDIDAL (ICD-112.1)  Treat with diflucan x 1 . Avoid topical irritiants. Call if not imrpoving.  Orders: Wet Prep ([id number])  Her updated medication list for this problem includes:    Fluconazole 150 Mg Tabs (Fluconazole) .Marland Kitchen... 1 tab po  x1 day  Complete Medication List: 1)  Depakote 500 Mg Tbec (Divalproex sodium) .... Take 1 tablet by mouth once a day 2)  Lamictal 25 Mg Tbdp (Lamotrigine) .... Take 1 tablet by mouth once a day 3)  Valtrex 1 Gm Tabs (Valacyclovir hcl) .... Takes 1/2 by mouth once daily 4)  Multivitamins Tabs (Multiple vitamin) .... Take 1 tablet by mouth once a day 5)  Serzone 200 Mg.  .... Take 1 tablet by mouth once a day 6)  Fluconazole 150 Mg Tabs (Fluconazole) .Marland Kitchen.. 1 tab po  x1 day Prescriptions: FLUCONAZOLE 150 MG TABS (FLUCONAZOLE) 1 tab po  x1 day  #1 x 0   Entered and Authorized by:  Kerby Nora MD   Signed by:   Kerby Nora MD on 08/23/2008   Method used:   Electronically to        [id number] (retail)       [address] [place], [place]  [address]       Ph: [phone] or [phone]       Fax: [phone]   RxID:   [id number]   Current Allergies (reviewed today): No known allergies   Laboratory Results      Wet Mount/KOH Source: vag WBC/hpf none Bacteria/hpf occ Clue cells/hpf rare Yeast/hpf yes KOH pos Trichomonas/hpf none

## 2010-03-16 NOTE — Procedures (Signed)
Summary: [facility]/Office Note/Dr. [facility]/Office Note/Dr. Evette Cristal   Imported By: Eleonore Chiquito 08/21/2007 09:44:30  _____________________________________________________________________  External Attachment:    Type:   Image     Comment:   External Document

## 2010-03-16 NOTE — Progress Notes (Signed)
Summary: GI distress  Phone Note Outgoing Call Call back at [phone]   Call placed by: Cooper Render,  Jul 09, 2007 11:23 AM Call placed to: Patient Summary of Call: gluten test neg.  What to do now? still having GI distress. What does she recommend? Initial call taken by: Cooper Render,  Jul 09, 2007 11:25 AM  Follow-up for Phone Call        See append to celiac disease test.  We have been trying to reach her for some time.  Follow-up by: Kerby Nora MD,  Jul 09, 2007 11:29 AM  Additional Follow-up for Phone Call Additional follow up Details #1::        pc to pt, given information.  does want further work up w/ GI Additional Follow-up by: Cooper Render,  Jul 09, 2007 11:36 AM         Appended Document: GI distress future lab appt scheduled 07/15/07 @ 9:15 am.

## 2010-03-16 NOTE — Letter (Signed)
Summary: Patient Notice- Colon Biospy Results  [facility]  [address] [place], [place] [address]   Phone: [phone]  Fax: [phone]        May 05, 2009 MRN: [id number]    [facility] [address] [place], [place]  [address]    Dear Diana Decker,  I am pleased to inform you that the biopsies taken during your recent colonoscopy did not show any evidence of cancer upon pathologic examination.  Additional information/recommendations:  __No further action is needed at this time.  Please follow-up with      your primary care physician for your other healthcare needs.  _x_Please call [phone] to schedule a return visit to review      your condition.  __Continue with the treatment plan as outlined on the day of your      exam.  __You should have a repeat colonoscopy examination for this problem           in _ years.  Please call us if you are having persistent problems or have questions about your condition that have not been fully answered at this time.  Sincerely,  Mardella Layman MD [facility]   This letter has been electronically signed by your physician.  Appended Document: Patient Notice- Colon Biospy Results Letter mailed 3.25.11

## 2010-03-16 NOTE — Assessment & Plan Note (Signed)
Summary: DISCUSS THYROID/CLE   Vital Signs:  Patient Profile:   45 Years Old Female Height:     67 inches Weight:      198.50 pounds Temp:     97.8 degrees F oral Pulse rate:   80 / minute Pulse rhythm:   regular BP sitting:   108 / 70  (left arm) Cuff size:   regular  Vitals Entered By: Delilah Shan (January 29, 2008 8:12 AM)                 Chief Complaint:  Discuss thyroid issues.  History of Present Illness: In past few months gaining weight. Having difficulty losing weight. Mother and sister with  thyroid problems. HAd thyrpoid evaluated at work: TSH 5.360, but individual factors norma although T3 free not checkedl.   no edema, no dry skin, no hair loss, no cold tolerance    Current Allergies (reviewed today): No known allergies         Impression & Recommendations:  Problem # 1:  FATIGUE (ICD-780.79) No clear thyroid abnormality, just abdnormal TSH. Will check free T3, free T4.  Will also check B12, vit D. No DM on recetn labs. Encouraged exercise, weight loss, healthy eating habits.  Orders: TLB-T4 (Thyrox), Free [phone]) TLB-T3, Free (Triiodothyronine) (84481-T3FREE) TLB-B12 + Folate Pnl [phone]) T-Vitamin D (25-Hydroxy) (13244-01027)   Problem # 2:  OVERWEIGHT (ICD-278.02)  Complete Medication List: 1)  Depakote 500 Mg Tbec (Divalproex sodium) .... Take 1 tablet by mouth once a day 2)  Lamictal 25 Mg Tbdp (Lamotrigine) .... Take 1 tablet by mouth once a day 3)  Valtrex 1 Gm Tabs (Valacyclovir hcl) .... Takes 1/2 by mouth once daily 4)  Multivitamins Tabs (Multiple vitamin) .... Take 1 tablet by mouth once a day 5)  Serzone 200 Mg.  .... Take 1 tablet by mouth once a day   Patient Instructions: 1)  Return in 6 weeks for TSH, free T3 and free T4 Dx 780.79   ] Current Allergies (reviewed today): No known allergies  Current Medications (including changes made in today's visit):  DEPAKOTE 500 MG  TBEC (DIVALPROEX SODIUM)  Take 1 tablet by mouth once a day LAMICTAL 25 MG  TBDP (LAMOTRIGINE) Take 1 tablet by mouth once a day VALTREX 1 GM  TABS (VALACYCLOVIR HCL) Takes 1/2 by mouth once daily MULTIVITAMINS   TABS (MULTIPLE VITAMIN) Take 1 tablet by mouth once a day * SERZONE 200 MG. Take 1 tablet by mouth once a day

## 2010-03-16 NOTE — Letter (Signed)
Summary: Generic Letter  [place] [facility]  [address] [place], [place] [address]   Phone: [phone]  Fax: [phone]    07/03/2007  [facility] LEWIS-YOW [address] [place], [place]  [address]  Dear Ms. LEWIS-YOW,  I have tried numerous times to reach you by phone.  I have left messages on your home phone number and have tried to reach you at your work number.  I cannot get through at work by the name directory and there is no Designer, television/film set assistance.    Please phone me at your earliest convenience so that I may set up a lab appointment for you.  My name is Lu at [phone], Ext. [phone].  If I am busy with patients, you will get my voicemail.  If so, PLEASE leave me a phone number that I can reach you or either an extension that I can reach you at work to phone you back.  Also, if there is another phone number that we should have in order to better contact you, please give that to the receptionst that answers the phone.    Sincerely,   [place]ugene Risk analyst at [facility]

## 2010-03-16 NOTE — Assessment & Plan Note (Signed)
Summary: LOWER ABD PAIN,DIARRHEA/CLE   Vital Signs:  Patient profile:   45 year old female Height:      67 inches Weight:      183.8 pounds BMI:     28.89 Temp:     98.0 degrees F oral Pulse rate:   72 / minute Pulse rhythm:   regular BP sitting:   110 / 70  (left arm) Cuff size:   regular  Vitals Entered By: Benny Lennert CMA Duncan Dull) (April 20, 2009 12:03 PM)  History of Present Illness: Chief complaint Lower abdominal pain and diarrhea  45 year old with abd pain, diarrhea  s/p appy, hernia  Has had a lot of loose stools, the last week has had a lot of loose stools. Family history of many gi problems. 3-4 this morning. Worst at night. More frequent but not unheard of. she has had some chronic diarrhea for a great deal of time. She saw one gastroenterologist several years ago, who recommended colonoscopy, and she never followed through with this. Her mother does have celiac disease.  Celiac panel including gliadin ab  tissue transglutaminase were negative  F with GI issues.   Saturday was having sharp shooting pains. Trouble with standing up straight and folding is lightheaded and will hurt. With movement.   LMP, last week. Married - no STD concerns.  able to drink. Diarrhea after eating. No nausea. No vomitting. No travelling or camping.   Patient: Diana Decker YOW Note: All result statuses are Final unless otherwise noted.  Tests: (1) Celiac Disease Ab Evaluation (8002)  Tissue Transglutaminase Ab,IgA                             1.2 U/mL                    <7        Reference Range:             <7      Negative             7-10    Equivocal             >10     Positive       ! Reticulin IgA Screen      Negative                    Negative ! Additional Testing        REPORT     not indicated. ! Gliadin Peptide Ab, IgG                             2.3 U/mL                    <7        Reference Range:             <7      Negative             7-10    Equivocal            >10     Positive       ! Gliadin Peptide Ab, IgA                             2.1 U/mL                    <  7        Reference Range:             <7      Negative             7-10    Equivocal             >10     Positive        Allergies (verified): No Known Drug Allergies  Past History:  Past medical, surgical, family and social histories (including risk factors) reviewed, and no changes noted (except as noted below).  Past Medical History: Reviewed history from 08/28/2007 and no changes required. Bipolar Disorder 1994 Borderline Personality Disorder 1997 Anemia 2000 IBS GERD, ocassional  Past Surgical History: Reviewed history from 08/28/2007 and no changes required. Appendectomy Inguinal hernia repair  Family History: Reviewed history from 01/06/2008 and no changes required. Breast Cancer-MotherA Adenomatous colon Polyps-Father--and prostate cancer, peripherial neuropathy  Social History: Reviewed history from 01/06/2008 and no changes required. Never Smoked Alcohol use-yes Regular exercise-yes Marital Status: Married--second marriage Children: no biological children, 2 step Occupation: Teaching laboratory technician artist--photophrography, [employer]  Review of Systems      See HPI General:  Denies chills and fever. CV:  Denies chest pain or discomfort. Resp:  Denies shortness of breath. GI:  Complains of abdominal pain, change in bowel habits, and diarrhea; denies bloody stools.  Physical Exam  General:  Well-developed,well-nourished,in no acute distress; alert,appropriate and cooperative throughout examination Head:  Normocephalic and atraumatic without obvious abnormalities. No apparent alopecia or balding. Ears:  no external deformities.   Nose:  no external deformity.   Neck:  No deformities, masses, or tenderness noted. Lungs:  Normal respiratory effort, chest expands symmetrically. Lungs are clear to auscultation, no crackles or wheezes. Heart:   Normal rate and regular rhythm. S1 and S2 normal without gallop, murmur, click, rub or other extra sounds. Abdomen:  mildly hyperactive bowel sounds.soft, non-tender, no distention, no masses, no guarding, no rigidity, no rebound tenderness, no abdominal hernia, no hepatomegaly, and no splenomegaly.   Extremities:  No clubbing, cyanosis, edema, or deformity noted with normal full range of motion of all joints.   Neurologic:  alert & oriented X3 and gait normal.   Psych:  Cognition and judgment appear intact. Alert and cooperative with normal attention span and concentration. No apparent delusions, illusions, hallucinations   Impression & Recommendations:  Problem # 1:  DIARRHEA, CHRONIC (ICD-787.91) Assessment Deteriorated nonacute abdominal exam today. She may have some gastroenteritis exacerbating this.  Cannot rule out inflammatory bowel disease, cannot rule out IBS. Patient does have a history of bipolar disorder, however this is under relatively good control. Very strong family history of multiple GI issues.  Consult GI. the patient agreed with me the following through with any recommended workup would be the best course of action  Orders: Gastroenterology Referral (GI)  Complete Medication List: 1)  Depakote 500 Mg Tbec (Divalproex sodium) .... Take 1 tablet by mouth once a day 2)  Lamictal 25 Mg Tbdp (Lamotrigine) .... Take 1 tablet by mouth once a day 3)  Valtrex 1 Gm Tabs (Valacyclovir hcl) .... Takes 1/2 by mouth once daily 4)  Multivitamins Tabs (Multiple vitamin) .... Take 1 tablet by mouth once a day 5)  Serzone 200 Mg.  .... Take 1 tablet by mouth once a day 6)  Hyoscyamine Sulfate 0.125 Mg Tabs (Hyoscyamine sulfate) .Marland Kitchen.. 1 by mouth 4 times daily as needed abdominal discomfort / diarrhea  Patient Instructions: 1)  Referral Appointment Information 2)  Day/Date: 3)  Time: 4)  Place/MD: 5)  Address: 6)  Phone/Fax: 7)  Patient given appointment information.  Information/Orders faxed/mailed.  Prescriptions: HYOSCYAMINE SULFATE 0.125 MG TABS (HYOSCYAMINE SULFATE) 1 by mouth 4 times daily as needed abdominal discomfort / diarrhea  #50 x 0   Entered and Authorized by:   Hannah Beat MD   Signed by:   Hannah Beat MD on 04/20/2009   Method used:   Electronically to        [facility]  [phone]* (retail)       [address] [place], [place]  [address]       Ph: [phone] or [phone]       Fax: [phone]   RxID:   [id number]   Current Allergies (reviewed today): No known allergies

## 2010-03-16 NOTE — Procedures (Signed)
Summary: Upper Endoscopy  Patient: Diana Decker Note: All result statuses are Final unless otherwise noted.  Tests: (1) Upper Endoscopy (EGD)   EGD Upper Endoscopy       DONE     [facility]     [address]     [place], [place]  [address]           ENDOSCOPY PROCEDURE REPORT           PATIENT:  Diana Decker, Diana Decker  MR#:  [id number]     BIRTHDATE:  12-31-1965, 44 yrs. old  GENDER:  female           ENDOSCOPIST:  Vania Rea. Jarold Motto, MD, [facility]     Referred by:           PROCEDURE DATE:  05/03/2009     PROCEDURE:  EGD with biopsy     ASA CLASS:  Class II     INDICATIONS:  diarrhea           MEDICATIONS:   Versed 1 mg IV, glycopyrrolate (Robinal) 0.2 mg     TOPICAL ANESTHETIC:           DESCRIPTION OF PROCEDURE:   After the risks benefits and     alternatives of the procedure were thoroughly explained, informed     consent was obtained.  The LB GIF-H180 [id number] endoscope was     introduced through the mouth and advanced to the second portion of     the duodenum, limited by retching and gagging.   The instrument     was slowly withdrawn as the mucosa was fully examined.     <<PROCEDUREIMAGES>>           Normal duodenal folds were noted. SMALL BOWEL BIOPSIES DONE.  The     esophagus and gastroesophageal junction were completely normal in     appearance.  The stomach was entered and closely examined. The     antrum, angularis, and lesser curvature were well visualized,     including a retroflexed view of the cardia and fundus. The stomach     wall was normally distensable. The scope passed easily through the     pylorus into the duodenum.  A Schatzki's ring was found. NOT     OBSTRUCTING.    Retroflexed views revealed unable to retroflex.     The scope was then withdrawn from the patient and the procedure     completed.           COMPLICATIONS:  None           ENDOSCOPIC IMPRESSION:     1) Normal duodenal folds     2) Normal esophagus     3) Normal stomach     4)  Schatzki's ring     5) Unable to retroflex     6) Normal upper endoscopy     R/O CELIAC DISEASE.PROBABLE IBS.     RECOMMENDATIONS:     1) Await biopsy results     2) continue current medications           REPEAT EXAM:  No           ______________________________     Vania Rea. Jarold Motto, MD, Clementeen Graham           CC:  Amy Michelle Nasuti, MD           n.     Rosalie DoctorVania Rea. Andi Layfield at 05/03/2009 11:22 AM  [place], [place], [phone]  Note: An exclamation mark (!) indicates a result that was not dispersed into the flowsheet. Document Creation Date: 05/03/2009 11:23 AM _______________________________________________________________________  (1) Order result status: Final Collection or observation date-time: 05/03/2009 11:13 Requested date-time:  Receipt date-time:  Reported date-time:  Referring Physician:   Ordering Physician: Sheryn Bison [phone]) Specimen Source:  Source: Launa Grill Order Number: [id number] Lab site:

## 2010-03-16 NOTE — Progress Notes (Signed)
Summary: Triage: Diarrhea & sharp abd pain  Phone Note From Other Clinic   Caller: [facility] @ Dr Dallas Schimke [phone] Call For: Dr Russella Dar Reason for Call: Schedule Patient Appt Summary of Call: Chronic diarrhea x1wk constant and sharp abd pain. Scheduled next available appoinment which is 05-23-09. Wonders if she can be worked in sooner? Initial call taken by: Leanor Kail [facility],  April 20, 2009 12:35 PM  Follow-up for Phone Call        Appt sch with Amy Esterwood. Follow-up by: Ashok Cordia RN,  April 20, 2009 2:09 PM

## 2010-03-16 NOTE — Assessment & Plan Note (Signed)
Summary: STOMACH PAIN,DIARRHEA/CLE   Vital Signs:  Patient Profile:   45 Years Old Female Weight:      196.13 pounds Temp:     98.2 degrees F oral Pulse rate:   80 / minute Pulse rhythm:   regular BP sitting:   104 / 60  (left arm) Cuff size:   regular  Vitals Entered By: Delilah Shan (Jun 24, 2007 9:01 AM)                 Chief Complaint:  Stomach pain and diarrhea.  History of Present Illness: Intermittant sharp abdominal pain, B lower quadrant x 1 year progressively worsening Diarrhea, urgently intermittantly After eating, pain and diarrhea occur at same time Bloating Avoided wheat products and felt a lot better  mom recently diagnosied with celiac disease, she would likre to be tested.      Social History:    Never Smoked    Alcohol use-yes    Regular exercise-yes   Risk Factors:  Tobacco use:  never Alcohol use:  yes Exercise:  yes   Review of Systems  General      Denies fatigue.  CV      Denies chest pain or discomfort.  Resp      Denies shortness of breath.  GI      Denies bloody stools and constipation.  GU      Denies dysuria.  Derm      Denies rash.   Physical Exam  General:     Well-developed,well-nourished,in no acute distress; alert,appropriate and cooperative throughout examination Neck:     no carotid bruit or thyromegaly  Lungs:     Normal respiratory effort, chest expands symmetrically. Lungs are clear to auscultation, no crackles or wheezes. Heart:     Normal rate and regular rhythm. S1 and S2 normal without gallop, murmur, click, rub or other extra sounds. Abdomen:     Bowel sounds positive,abdomen soft and mild BLQ -tender without masses, organomegaly or hernias noted.no guarding and no rebound tenderness.      Impression & Recommendations:  Problem # 1:  DIARRHEA, CHRONIC (ICD-787.91) IBS vs celiac sprue vs other. No red flags.  Given pt specific concern, we can check celiac panl. if neg will procede with  furhter work up. Orders: T-Sprue Panel (Celiac Disease Aby Eval) [id number])   Complete Medication List: 1)  Depakote 500 Mg Tbec (Divalproex sodium) .... Take 1 tablet by mouth once a day 2)  Serzone ? Mg.  .... Take 1 tablet by mouth once a day 3)  Lamictal 25 Mg Tbdp (Lamotrigine) .... Take 1 tablet by mouth once a day 4)  Valtrex 1 Gm Tabs (Valacyclovir hcl) .... Takes 1/2 by mouth once daily 5)  Multivitamins Tabs (Multiple vitamin) .... Take 1 tablet by mouth once a day    ]  Current Medications (including changes made in today's visit):  DEPAKOTE 500 MG  TBEC (DIVALPROEX SODIUM) Take 1 tablet by mouth once a day * SERZONE   ? MG. Take 1 tablet by mouth once a day LAMICTAL 25 MG  TBDP (LAMOTRIGINE) Take 1 tablet by mouth once a day VALTREX 1 GM  TABS (VALACYCLOVIR HCL) Takes 1/2 by mouth once daily MULTIVITAMINS   TABS (MULTIPLE VITAMIN) Take 1 tablet by mouth once a day

## 2010-06-05 ENCOUNTER — Encounter: Payer: Self-pay | Admitting: Family Medicine

## 2010-06-07 ENCOUNTER — Ambulatory Visit: Payer: Self-pay | Admitting: Family Medicine

## 2010-06-08 ENCOUNTER — Encounter: Payer: Self-pay | Admitting: Family Medicine

## 2010-06-08 ENCOUNTER — Ambulatory Visit (INDEPENDENT_AMBULATORY_CARE_PROVIDER_SITE_OTHER): Payer: BC Managed Care – PPO | Admitting: Family Medicine

## 2010-06-08 VITALS — BP 100/68 | HR 80 | Temp 98.6°F | Ht 67.0 in | Wt 188.4 lb

## 2010-06-08 DIAGNOSIS — F319 Bipolar disorder, unspecified: Secondary | ICD-10-CM | POA: Insufficient documentation

## 2010-06-08 DIAGNOSIS — E039 Hypothyroidism, unspecified: Secondary | ICD-10-CM

## 2010-06-08 HISTORY — DX: Hypothyroidism, unspecified: E03.9

## 2010-06-08 MED ORDER — LEVOTHYROXINE SODIUM 50 MCG PO TABS: 50.0000 ug | ORAL_TABLET | Freq: Every day | ORAL | Status: DC

## 2010-06-08 NOTE — Progress Notes (Signed)
45 year old female here to f/u on labs:  [facility].   2 years ago, thyroid was abnormal  Has noticed some weight gain Lost a bunch of weight when married  40 pounds in 2 years Doing the biggest loser at work.  Extensive lab work reviewed FLP, CMP, CBC, Thyroid panels  TSH high, normal T4 and T3  Thyroid: Patient presents for evaluation of hypothyroidism. Current symptoms include weight gain. Patient denies feeling cold and cold intolerance, constipation, swelling, feeling excessive energy, tremulousness, palpitations, sweating, weight loss, diarrhea, change in skin,  nails, or hair, heat intolerance.   Patient Active Problem List  Diagnoses  . VAGINITIS, CANDIDAL  . OVERWEIGHT  . ANXIETY  . GERD  . IBS  . FATIGUE  . DIARRHEA, CHRONIC  . ABDOMINAL PAIN, GENERALIZED  . ABDOMINAL PAIN-MULTIPLE SITES  . BLOOD IN STOOL, OCCULT  . Bipolar 1 disorder  . Hypothyroidism   Past Medical History  Diagnosis Date  . Anemia   . IBS (irritable bowel syndrome)   . GERD (gastroesophageal reflux disease)   . Anxiety   . Bipolar 1 disorder   . Borderline personality disorder   . Hypothyroidism 06/08/2010   Past Surgical History  Procedure Date  . Appendectomy   . Hernia repair     inguinal   History  Substance Use Topics  . Smoking status: Never Smoker   . Smokeless tobacco: Not on file  . Alcohol Use: Yes   Family History  Problem Relation Age of Onset  . Cancer Mother     breast  . Cancer Father     prostate  . Heart disease Father    Not on File Current Outpatient Prescriptions on File Prior to Visit  Medication Sig Dispense Refill  . divalproex (DEPAKOTE) 500 MG EC tablet Take 500 mg by mouth daily.        . hyoscyamine (LEVSIN, ANASPAZ) 0.125 MG tablet Take 0.125 mg by mouth every 4 (four) hours as needed.        . lamoTRIgine (LAMICTAL) 25 MG tablet Take 25 mg by mouth daily.        . Multiple Vitamin (MULTIVITAMIN) tablet Take 1 tablet by mouth daily.          . nefazodone (SERZONE) 200 MG tablet Take 200 mg by mouth daily.        . Probiotic Product (PHILLIPS COLON HEALTH PO) Take 1 tablet by mouth 2 (two) times daily.        . valACYclovir (VALTREX) 1000 MG tablet Take 500 mg by mouth daily.         ROS: GEN: No acute illnesses, no fevers, chills. GI: No n/v/d, eating normally Pulm: No SOB Interactive and getting along well at home.  Otherwise, ROS is as per the HPI.  GEN: WDWN, NAD, Non-toxic, A & O x 3 HEENT: Atraumatic, Normocephalic. Neck supple. No masses, No LAD. Ears and Nose: No external deformity. CV: RRR, No M/G/R. No JVD. No thrill. No extra heart sounds. PULM: CTA B, no wheezes, crackles, rhonchi. No retractions. No resp. distress. No accessory muscle use. EXTR: No c/c/e NEURO Normal gait.  PSYCH: Normally interactive. Conversant. Not depressed or anxious appearing.  Calm demeanor.

## 2010-06-08 NOTE — Patient Instructions (Signed)
Lab visit in 1 month

## 2010-06-12 ENCOUNTER — Encounter: Payer: Self-pay | Admitting: Family Medicine

## 2010-06-30 NOTE — Discharge Summary (Signed)
[name], SPEAR NO.:  [id number]   MEDICAL RECORD NO.:  [id number]                   PATIENT TYPE:  IPS   LOCATION:  0300                                 FACILITY:  [facility]   PHYSICIAN:  Jeanice Lim, M.D.              DATE OF BIRTH:  1965/12/30   DATE OF ADMISSION:  01/02/2002  DATE OF DISCHARGE:  01/05/2002                                 DISCHARGE SUMMARY   IDENTIFYING DATA:  This is a 45 year old Caucasian female noting increased  frustration  at work, pressures with finances and living with her parents,  confided to private psychiatrist, Dr. Evelene Croon, she is having suicidal ideation  and was admitted for safety monitoring and medication stabilization.   ALLERGIES:  No known drug allergies.   PAST PSYCHIATRIC HISTORY:  The patient was treated at the [facility] in 1994, 1999, 2001 with a dual diagnosis and an outpatient followup.  She has been followed by Dr. Dub Mikes and then Dr. Evelene Croon.   PAST MEDICAL HISTORY:  1. Status post appendectomy in the last seven  months.  2. Inguinal hernia repair in 1996.   PHYSICAL EXAMINATION:  Essentially within normal limits. Neurologically  nonfocal.   LABORATORY DATA:  Routine admission labs essentially within normal limits.  Urine drug screen positive for amphetamines. TSH within normal limits.  Hemoglobin and hematocrit low at 11.9 and 34.8 respectively.   MENTAL STATUS EXAM:  The patient was  alert and oriented. She had poor  hygiene but dressed appropriately. Speech was within normal limits. No  pressured speech. The patient was calm, giddy at times. Easily irritated.  Thought process was goal directed. Thought content was negative for  psychotic symptoms or acute dangerous ideation. The patient admitted to  passive suicidal thoughts. Cognition intact. Judgment and insight fair.   ADMISSION DIAGNOSES:   AXIS I:  1. 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  2. Positive urine drug screen for amphetamines.  3. History of dual diagnosis treatment.   AXIS II:  Rule out personality disorder.   AXIS III:  Status post appendectomy, left inguinal hernia repair  and  obesity.   AXIS IV:  Moderate problems with primary support group and problems related  to social environment, housing and economic problems.   AXIS V:  40/65.   HOSPITAL COURSE:  The patient was admitted where and we ordered routine  p.r.n.  medications. She underwent further monitoring. Consulted Dr. Evelene Croon  regarding recommendations for medication changes, as well as monitoring for  safety. She was encouraged to participate in individual, group and milieu  therapy. She was resumed on Ambien, Depakote, Valtrex, Serzone. Her Depakote  level  was obtained and Serzone was optimized. Lamictal was started for  the  depressed phase of  mood swings and the patient participated in therapy and  aftercare planning. She reported a positive response to  clinical  intervention.   Her condition on discharge was markedly improved. Her mood was more stable  and euthymic, affect brighter, thought process is goal directed, thought  content negative for any dangerous ideation or psychotic symptoms. The  patient reported motivation to be compliant with aftercare  plan and  continue to be substance abuse  free.   DISCHARGE MEDICATIONS:  1. Depakote ER 500 mg 1 q. a.m.  and 2 q.h.s.  2. Serzone 200 mg q. a.m.  and 1-1/2 q.h.s.  3. Lamictal 25 mg every other night.  4. Depakote, the patient will require a gradual titration to a therapeutic     dose.  5. Valtrex 500 mg q. a.m.  6. Ambien 10 mg q.h.s. p.r.n.  insomnia.   FOLLOW UP:  The patient was to follow up with Dr. Evelene Croon at 10  a.m. and Felix Pacini.   DISCHARGE DIAGNOSES:   AXIS I:  1. 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  2. Positive urine drug screen for amphetamines.  3. History of dual diagnosis  treatment.   AXIS II:  Rule out personality disorder.   AXIS III:  Status post appendectomy, left inguinal hernia repair  and  obesity.   AXIS IV:  Moderate problems with primary support group and problems related  to social environment, housing and economic problems.   AXIS VKallie Decker, M.D.    JEM/MEDQ  D:  01/15/2002  T:  01/15/2002  Job:  [id number]

## 2010-06-30 NOTE — Assessment & Plan Note (Signed)
[facility]                               PULMONARY OFFICE NOTE   NAME:Diana Decker, Diana Decker                      MRN:          [id number]  DATE:11/12/2005                            DOB:          01/29/1966    REASON FOR CONSULTATION:  Cough.   HISTORY:  This is a 45 year old white female who states that she has had  seasonal rhinitis since she was in her early 23s, typically in the spring  and equal to the fall, occasionally complicated by sinus infection.  However, this episode is the worst ever.  This episode started September 5  with a typical sinus problem and has evolved over the last 2 weeks to the  point where she is having trouble both with cough and difficulty with  breathing, which are distinctly unusual for her.  She is better since being  started on AeroBid and albuterol.  She denies any ongoing purulent sputum  production, fevers, chills, sweats, orthopnea, PND, or leg swelling.  She  has received 3 different antibiotics and a shot of cortisone, not sure  which helped her the most.   Presently she denies any orthopnea, PND, or leg swelling, fevers, chills,  sweats, overt sinus or reflux symptoms.   PAST MEDICAL HISTORY:  Significant for remote appendectomy and inguinal  hernia surgery.   ALLERGIES:  None known.   MEDICATIONS:  Lamictal, Depakote, Serzone, and AeroBid 2 puffs b.i.d.   SOCIAL HISTORY:  She has never smoked.  She works as a Environmental manager.   FAMILY HISTORY:  Reviewed in detail on the worksheet, significant for the  absence of atopy or respiratory disease.   REVIEW OF SYSTEMS:  Also taken on the worksheet.  Significant for problems  outlined above.   PHYSICAL EXAMINATION:  This is a pleasant ambulatory white female in no  acute distress.  She had stable vital signs.  HEENT:  Reveals moderate turbinate edema greater on the left than right with  mucoid secretions.  There is no cyanosis, pallor, or polyps.  The  oropharynx  is clear with minimal cobblestoning.  NECK:  Supple without cervical adenopathy or tenderness.  Trachea is  midline.  No thyromegaly.  LUNGS:  Minimal pseudo-wheeze and voice was hoarse on phonation.  There is  regular rate and rhythm without murmur, rub, or gallop present.  ABDOMEN:  Soft, benign.  EXTREMITIES:  Warm without calf tenderness, cyanosis, clubbing, or edema.   IMPRESSION:  1. Apparent upper airway cough syndrome precipitated perhaps by seasonal      rhinitis and exacerbated by ongoing sinusitis despite taking 3      different antibiotics.  I don't believe that she actually has asthma      unless it is secondary to upper airway coughing with reflux and/or post-      nasal drip syndrome.   To test this hypothesis I have recommended that she stop her present  inhalers and just use albuterol p.r.n. aggressively treating rhinitis with a  combination of Ocean Nasal Spray, Advil Cold and Sinus, Mucin ex DM and  avoiding all menthol-containing lozenges and cough  drops, which would  promote reflux, and instead using short-term Zegerid 40 mg one nightly for  the next 15 days.   I would like to see her back in 3 weeks after obtaining a sinus CT scan to  see if anything further needs to be done to control her upper airway  symptoms.            ______________________________  Charlaine Dalton Sherene Sires, MD, [facility]      MBW/MedQ  DD:  11/12/2005  DT:  11/13/2005  Job #:  [id number]   cc:   Louanna Raw

## 2010-06-30 NOTE — Op Note (Signed)
   TNAMEANANDI, Diana NO.:  [id number]   MEDICAL RECORD NO.:  [id number]                   PATIENT TYPE:  OBV   LOCATION:  0375                                 FACILITY:  [facility]   PHYSICIAN:  Skeet Simmer., M.D.         DATE OF BIRTH:  1965/12/29   DATE OF PROCEDURE:  10/08/2001  DATE OF DISCHARGE:                                 OPERATIVE REPORT   PREOPERATIVE DIAGNOSIS:  Acute appendicitis.   POSTOPERATIVE DIAGNOSIS:  Acute appendicitis.   OPERATION:  Laparoscopic appendectomy.   SURGEON:  Zigmund Daniel, M.D.   ANESTHESIA:  General.   DESCRIPTION OF PROCEDURE:  After the patient was monitored and anesthetized  by general endotracheal technique and had routine preparation and draping of  the abdomen and a Foley catheter inserted, a made a small transverse  infraumbilical incision through an area anesthetized with long acting long  anesthetic. I incised the fascia longitudinally, entered the peritoneal  cavity bluntly, placed a #0 Vicryl pursestring suture in the fascia and  secured a Hasson cannula. I inflated the abdomen with CO2 and inspected the  right lower quadrant and noted there was inflammation present. I put in a 5  mm port in the right upper quadrant and a 12 mm port in the lower midline  well above the bladder through anesthetized areas. I then dissected the area  of inflammation after elevating the cecum upward and saw that there was an  inflamed appendix. I grasped it and elevated it and then thinned down the  mesentery somewhat with cautery dissection taking care to avoid injury to  the distal ileum and colon. I then fired the endoscopic cutting stapler  across the mesentery and appendix after clearly identifying the anatomy. I  amputated the appendix nicely. Hemostasis was good. I did not feel that any  irrigation was necessary. The pelvic organs and other organs looked normal.  I placed the appendix into a plastic  pouch and removed it through the  umbilical incision and tied the pursestring suture. I then removed the  lateral port under direct vision, allowed the CO2 to escape and removed the  lower midline port. I then closed all skin incisions with intracuticular 4-0  Vicryl and Steri-Strips. The patient tolerated the procedure well.                                               Skeet Simmer., M.D.    Elvis Coil  D:  10/08/2001  T:  10/10/2001  Job:  [id number]

## 2010-06-30 NOTE — Assessment & Plan Note (Signed)
[facility]                               PULMONARY OFFICE NOTE   NAME:LEWIS-YOWTagen, Diana                      MRN:          [id number]  DATE:12/07/2005                            DOB:          05-10-1965    This is pulmonary/final extended followup visit.   This is a 45 year old white female with symptoms of coughing and dyspnea  suggesting either poorly controlled rhinitis, reflux or asthma when I first  evaluated her.  I gave her a detailed list of medications, both maintenance  and as needed and the indications for each one on a previous visit and by  trial and error arrived at a recommendation that she stop all inhaled  steroids, because I was convinced the problem was more upper airway than  lower airway and that the inhaled steroids might be destabilizing the upper  airway.  She returns today stating she is 100% back to normal except for  shortness of breath that occurred when she tried to work out yesterday (it  turns out she had not actually tried to work out in over a month and thinks  that she is just out of shape.  She denies any increased dyspnea after  exercise or related to weather changes, nocturnal wheeze, fever, chills,  sweats, feet or leg swelling.  She has used her albuterol maybe once or  twice since her previous visit.   PHYSICAL EXAMINATION:  She is a pleasant ambulatory white female with a bit  of an unusual affect and attitude.  She has stable vital signs.  HEENT:  Unremarkable.  OROPHARYNX:  Clear.  LUNG FIELDS:  Reveal a marked pseudo wheeze.  This is under complete  voluntary control because she stopped making all wheezing when I asked her  to.  She was regular rate and rhythm without murmur, gallop, rub.  ABDOMEN:  Soft, benign.  EXTREMITIES:  Warm without any calf tenderness, cyanosis, clubbing, edema.   Because of her complaints of dyspnea with exertion, PFTs were performed  today and are completely normal.   IMPRESSION:  No evidence of active asthma.  The wheeze that she did was  under voluntary control and suggests vocal cord dysfunction, in that it may  be partially functional, and therefore probably it does not need to be  treated long term with other medicines directed at vocal cord dysfunction  such as proton pump inhibitors.   I went over the rule of twos very carefully with her and also recommended  dietary measures to reduce the likelihood of acid reflux destabilizing  either upper or lower airways dysfunction.   For now she is on no pulmonary medications, may use Proventil up to twice  weekly without calling for a follow up, otherwise will need to see her in  the office to reconsider a specific diagnosis (either treating reflux more  aggressively or initiating reflux medications and then doing a follow up  methacholine challenge test to see if she does in fact have significant  asthma correlating with any of her symptoms.   I did also review with her the difference between  deconditioning and asthma  that may occur with exercise to help her indicate whether or not she should  return for followup.  It should help her decide whether she should return  here for follow up or simply continue to work harder on reconditioning.    ______________________________  Diana Dalton. Sherene Sires, MD, [facility]    MBW/MedQ  DD: 12/07/2005  DT: 12/09/2005  Job #: [id number]   cc:   Louanna Raw

## 2010-06-30 NOTE — H&P (Signed)
Diana Decker, Diana Decker                             ACCOUNT NO.:  [id number]   MEDICAL RECORD NO.:  [id number]                   PATIENT TYPE:  IPS   LOCATION:  0300                                 [facility]ACILITY:  [facility]   PHYSICIAN:  Jeanice Lim, M.D.              DATE OF BIRTH:  1965/05/23   DATE OF ADMISSION:  01/02/2002  DATE OF DISCHARGE:                         PSYCHIATRIC ADMISSION ASSESSMENT   IDENTIFYING INFORMATION:  (Comes from the patient).  She was voluntarily  admitted.   REASON FOR ADMISSION AND SYMPTOMS:  The patient notes increasing frustration  at work, finances, life and living with parents.  She confided to her  private psychiatrist, Dr. Milagros Evener, that she was having suicidal  ideation and hence the patient was admitted for safety.  Today, the patient  denies suicidal ideation.  Anticipates meeting with Dr. Evelene Croon on Monday  regarding medication changes.   PAST PSYCHIATRIC HISTORY:  She has been an inpatient at [facility] in 1994, 1999 and also having dual-diagnosis and was treated in the  outpatient setting at [facility] in 2001.  She became a  patient of Dr. Milagros Evener six months ago after Dr. Geoffery Lyons retired  from [employer].  She is currently on Depakote and Serzone.   SOCIAL HISTORY:  She is divorced, no children.  She is living with her  parents for the past year.  She graduated college.  She currently works at  [employer].   FAMILY HISTORY:  Her mother is diagnosed with depression and ADD.  She is  treated by Dr. Shelly Flatten.  Her father is an alcoholic and also abuses  Librium.  Her 57 year old sister is also an active alcoholic.   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  She has never smoked nicotine  products.   PAST MEDICAL HISTORY:  No medical illnesses.  She is followed by [facility] at  [facility].  Medical problems  include herpes for which she takes Valtrex  for suppression.   ALLERGIES:  No known drug allergies.   PHYSICAL EXAMINATION:  She is status post an appendectomy this past summer.  She also had a left inguinal herniorrhaphy in 1996.  She is obese.  Her  teeth could use a cleaning.  She is followed for her OB/GYN exams at  [facility].  She has not had a mammogram and is anxious to get one as  her mother has had breast cancer postmenopausally.  The remainder of her  physical examination was unremarkable.   MENTAL STATUS EXAM:  She is alert and oriented x 3.  Her appearance and  behavior reflected hygiene.  She is neatly and appropriately dressed.  Her  speech had a normal tone, rate and rhythm.  Specifically, there was no  pressured speech noted.  She was calm,  almost giddy at times, easily  irritated.  Her thought processes were logical, relevant.  No problems noted  with attention or concentration.  Her memory was intact.  Her insight and  judgment were intact.  There was no psychotic thoughts or speech were  acknowledged.   DIAGNOSES:   AXIS I:  Bipolar disorder.   AXIS II:  Rule out personality disorder.   AXIS III:  1. Status post appendectomy.  2. Status post left inguinal hernia repair.  3. Obesity.   AXIS IV:  She currently has problems with primary support group, problems  related to social environment, problems with housing and economic problems.   AXIS V:  She is currently probably about 40 and, in the past year, 65-70.   PLAN:  1. Hospitalize for safety.  2. Consult with Dr. Evelene Croon regarding her planned changes for medication.  3. To relieve suicidal ideation.  4. Check a Depakote level and liver function.     Mickie Deery Adams, C.P.A.                Jeanice Lim, M.D.    MDA/MEDQ  D:  01/03/2002  T:  01/04/2002  Job:  [id number]

## 2010-07-03 ENCOUNTER — Other Ambulatory Visit: Payer: BC Managed Care – PPO

## 2010-07-04 ENCOUNTER — Other Ambulatory Visit (INDEPENDENT_AMBULATORY_CARE_PROVIDER_SITE_OTHER): Payer: BC Managed Care – PPO | Admitting: Family Medicine

## 2010-07-04 DIAGNOSIS — E039 Hypothyroidism, unspecified: Secondary | ICD-10-CM

## 2010-07-04 LAB — TSH: TSH: 2.66 u[IU]/mL (ref 0.35–5.50)

## 2010-07-06 ENCOUNTER — Other Ambulatory Visit: Payer: BC Managed Care – PPO

## 2010-07-06 NOTE — Progress Notes (Signed)
Patient says that she went to the dentist the other day and they said that her thyroid was enlarged. Patient wants to know what this means and if she should be concerned

## 2010-07-06 NOTE — Patient Instructions (Signed)
Has this been taken care of? Looks like TSH was normal and reported to pt. No reason for concern.

## 2010-10-19 ENCOUNTER — Other Ambulatory Visit: Payer: Self-pay | Admitting: Family Medicine

## 2011-03-22 ENCOUNTER — Encounter: Payer: Self-pay | Admitting: Family Medicine

## 2011-03-22 ENCOUNTER — Ambulatory Visit (INDEPENDENT_AMBULATORY_CARE_PROVIDER_SITE_OTHER): Payer: BC Managed Care – PPO | Admitting: Family Medicine

## 2011-03-22 VITALS — BP 105/72 | HR 71 | Temp 97.9°F | Resp 16 | Ht 67.5 in | Wt 193.0 lb

## 2011-03-22 DIAGNOSIS — J029 Acute pharyngitis, unspecified: Secondary | ICD-10-CM

## 2011-03-22 DIAGNOSIS — R51 Headache: Secondary | ICD-10-CM

## 2011-03-22 LAB — POCT RAPID STREP A (OFFICE): Rapid Strep A Screen: NEGATIVE

## 2011-03-22 MED ORDER — IPRATROPIUM BROMIDE 0.03 % NA SOLN: 2.0000 | Freq: Four times a day (QID) | NASAL | Status: DC

## 2011-03-22 NOTE — Progress Notes (Signed)
  Patient Name: Diana Decker Date of Birth: 16-Sep-1965 Medical Record Number: [id number] Gender: female Date of Encounter: 03/22/2011  History of Present Illness:  Diana Decker is a 46 y.o. very pleasant female patient who presents with the following:  For the past few days has felt run down- then today developed HA, ST, drainage, sneezing.  "I feel like crap."  Not sure if any fever.  Does have chills and aches.  No cough, feels a little nauseated due to drainage/ PND.  No wheezing.  Has not tried any otc meds yet.  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    Patient Active Problem List  Diagnoses  . VAGINITIS, CANDIDAL  . OVERWEIGHT  . ANXIETY  . GERD  . IBS  . FATIGUE  . DIARRHEA, CHRONIC  . ABDOMINAL PAIN, GENERALIZED  . ABDOMINAL PAIN-MULTIPLE SITES  . BLOOD IN STOOL, OCCULT  . Bipolar 1 disorder  . Hypothyroidism   Past Medical History  Diagnosis Date  . Anemia   . IBS (irritable bowel syndrome)   . GERD (gastroesophageal reflux disease)   . Anxiety   . Bipolar 1 disorder   . Borderline personality disorder   . Hypothyroidism 06/08/2010   Past Surgical History  Procedure Date  . Appendectomy   . Hernia repair     inguinal   History  Substance Use Topics  . Smoking status: Never Smoker   . Smokeless tobacco: Not on file  . Alcohol Use: Yes   Family History  Problem Relation Age of Onset  . Cancer Mother     breast  . Cancer Father     prostate  . Heart disease Father    No Known Allergies  Medication list has been reviewed and updated. I have reviewed the patient's medical history in detail and updated the computerized patient record.   Review of Systems: As per HPI, otherwise negative  Physical Examination: Filed Vitals:   03/22/11 1327  BP: 105/72  Pulse: 71  Temp: 97.9 F (36.6 C)  TempSrc: Oral  Resp: 16  Height: 5' 7.5" (1.715 m)  Weight: 193 lb (87.544 kg)    Body mass index is 29.78  kg/(m^2).  GEN: WDWN, NAD, Non-toxic, A & O x 3 HEENT: Atraumatic, Normocephalic. Neck supple. No masses, No LAD., overweight Ears and Nose: No external deformity.  TM wnl bilaterally, oropharynx wnl, nasal cavity with congestion and irritation CV: RRR, No M/G/R. No JVD. No thrill. No extra heart sounds. PULM: CTA B, no wheezes, crackles, rhonchi. No retractions. No resp. distress. No accessory muscle use. ABD: S, NT, ND. EXTR: No c/c/e NEURO Normal gait.  PSYCH: Normally interactive. Conversant. Not depressed or anxious appearing.  Calm demeanor.  Results for orders placed in visit on 03/22/11  POCT RAPID STREP A (OFFICE)      Component Value Range   Rapid Strep A Screen Negative  Negative      Assessment and Plan: 1. Sore throat  POCT rapid strep A  2. Headache    Likely viral URI.  [facility]iven Rx atrovent NS to use prn.  Also gave hard copy amoxicillin 500 2BID for 7 days rx to hold- she can use this if not better in the next few days for sinus infection.  Call or RTC if not better in 4-5 days, Sooner if worse.

## 2011-03-22 NOTE — Patient Instructions (Signed)
Can fill amoxicillin if not better in a few days.  In the meantime try atrovent spray, vaseline for nares, otc pain medications for HA.  Let us know if you get worse or if your symptoms change!

## 2011-07-03 ENCOUNTER — Other Ambulatory Visit: Payer: Self-pay | Admitting: Family Medicine

## 2011-09-25 ENCOUNTER — Other Ambulatory Visit: Payer: Self-pay | Admitting: Family Medicine

## 2011-09-25 NOTE — Telephone Encounter (Signed)
Nees appt, refill until then

## 2011-09-29 ENCOUNTER — Other Ambulatory Visit: Payer: Self-pay | Admitting: Family Medicine

## 2011-10-19 ENCOUNTER — Encounter: Payer: Self-pay | Admitting: Family Medicine

## 2011-10-19 ENCOUNTER — Ambulatory Visit (INDEPENDENT_AMBULATORY_CARE_PROVIDER_SITE_OTHER): Payer: BC Managed Care – PPO | Admitting: Family Medicine

## 2011-10-19 VITALS — BP 110/66 | HR 74 | Resp 16 | Wt 189.2 lb

## 2011-10-19 DIAGNOSIS — Z13220 Encounter for screening for lipoid disorders: Secondary | ICD-10-CM

## 2011-10-19 DIAGNOSIS — E039 Hypothyroidism, unspecified: Secondary | ICD-10-CM

## 2011-10-19 DIAGNOSIS — K589 Irritable bowel syndrome without diarrhea: Secondary | ICD-10-CM

## 2011-10-19 DIAGNOSIS — F319 Bipolar disorder, unspecified: Secondary | ICD-10-CM

## 2011-10-19 NOTE — Patient Instructions (Addendum)
Return for fasting labs early next week.  We will call with lab results. Work on regular exercsie routine and healthy diet. Work on weight loss as able.

## 2011-10-19 NOTE — Assessment & Plan Note (Signed)
Due for re-eval. Well controlled.

## 2011-10-19 NOTE — Assessment & Plan Note (Signed)
Well controlled with probiotic and diuet.

## 2011-10-19 NOTE — Progress Notes (Signed)
Subjective:    Patient ID: Diana Decker, female    DOB: 1965/03/03, 46 y.o.   MRN: [id number]  HPI  46 year old femlae with bipolar disorder and anxiety presents for chronic medication management.  Sees [facility] for CPX yearly.  Hypothyroid: Due for re-eval. Lab Results  Component Value Date   TSH 2.66 07/04/2010   Diet: Moderate but fairly hungry all the time.  Stress at work.  Limited exercise.  Wt Readings from Last 3 Encounters:  10/19/11 189 lb 4 oz (85.843 kg)  03/22/11 193 lb (87.544 kg)  06/08/10 188 lb 6.4 oz (85.458 kg)     Review of Systems  Constitutional: Negative for fever, fatigue and unexpected weight change.  HENT: Negative for ear pain, congestion, sore throat, sneezing, trouble swallowing and sinus pressure.   Eyes: Negative for pain and itching.  Respiratory: Negative for cough, shortness of breath and wheezing.   Cardiovascular: Negative for chest pain, palpitations and leg swelling.  Gastrointestinal: Negative for nausea, abdominal pain, diarrhea, constipation and blood in stool.       Probiotic and diet helps with IBS.  Genitourinary: Negative for dysuria, hematuria, vaginal discharge, difficulty urinating and menstrual problem.  Skin: Negative for rash.  Neurological: Negative for syncope, weakness, light-headedness, numbness and headaches.  Psychiatric/Behavioral: Negative for confusion and dysphoric mood. The patient is not nervous/anxious.        Mood followed by pshychiatrist, stable control of mood.       Objective:   Physical Exam  Constitutional: Vital signs are normal. She appears well-developed and well-nourished. She is cooperative.  Non-toxic appearance. She does not appear ill. No distress.  HENT:  Head: Normocephalic.  Right Ear: Hearing, tympanic membrane, external ear and ear canal normal. Tympanic membrane is not erythematous, not retracted and not bulging.  Left Ear: Hearing, tympanic membrane, external ear and ear canal  normal. Tympanic membrane is not erythematous, not retracted and not bulging.  Nose: No mucosal edema or rhinorrhea. Right sinus exhibits no maxillary sinus tenderness and no frontal sinus tenderness. Left sinus exhibits no maxillary sinus tenderness and no frontal sinus tenderness.  Mouth/Throat: Uvula is midline, oropharynx is clear and moist and mucous membranes are normal.  Eyes: Conjunctivae, EOM and lids are normal. Pupils are equal, round, and reactive to light. No foreign bodies found.  Neck: Trachea normal and normal range of motion. Neck supple. Carotid bruit is not present. No mass and no thyromegaly present.  Cardiovascular: Normal rate, regular rhythm, S1 normal, S2 normal, normal heart sounds, intact distal pulses and normal pulses.  Exam reveals no gallop and no friction rub.   No murmur heard. Pulmonary/Chest: Effort normal and breath sounds normal. Not tachypneic. No respiratory distress. She has no decreased breath sounds. She has no wheezes. She has no rhonchi. She has no rales.  Abdominal: Soft. Normal appearance and bowel sounds are normal. There is no tenderness.  Neurological: She is alert.  Skin: Skin is warm, dry and intact. No rash noted.  Psychiatric: Her speech is normal and behavior is normal. Judgment and thought content normal. Her mood appears not anxious. Cognition and memory are normal. She does not exhibit a depressed mood.          Assessment & Plan:  The patient's preventative maintenance and recommended screening tests for an annual wellness exam were reviewed in full today. Brought up to date unless services declined.  Counselled on the importance of diet, exercise, and its role in overall health and  mortality. The patient's FH and SH was reviewed, including their home life, tobacco status, and drug and alcohol status.   Vaccines: Uptodate Nonsmoker Pap. DVE.breast: 03/2011 nml Mammogram: [facility], nml 2-04/2011.

## 2011-10-19 NOTE — Assessment & Plan Note (Signed)
Stable

## 2011-10-23 ENCOUNTER — Other Ambulatory Visit (INDEPENDENT_AMBULATORY_CARE_PROVIDER_SITE_OTHER): Payer: BC Managed Care – PPO

## 2011-10-23 ENCOUNTER — Encounter: Payer: Self-pay | Admitting: *Deleted

## 2011-10-23 DIAGNOSIS — E039 Hypothyroidism, unspecified: Secondary | ICD-10-CM

## 2011-10-23 DIAGNOSIS — Z13220 Encounter for screening for lipoid disorders: Secondary | ICD-10-CM

## 2011-10-23 LAB — COMPREHENSIVE METABOLIC PANEL
ALT: 14 U/L (ref 0–35)
AST: 19 U/L (ref 0–37)
Albumin: 3.6 g/dL (ref 3.5–5.2)
Alkaline Phosphatase: 43 U/L (ref 39–117)
BUN: 11 mg/dL (ref 6–23)
CO2: 28 mEq/L (ref 19–32)
Calcium: 9.1 mg/dL (ref 8.4–10.5)
Chloride: 104 mEq/L (ref 96–112)
Creatinine, Ser: 0.6 mg/dL (ref 0.4–1.2)
GFR: 110.3 mL/min (ref >60.00)
Glucose, Bld: 86 mg/dL (ref 70–99)
Potassium: 4.1 mEq/L (ref 3.5–5.1)
Sodium: 140 mEq/L (ref 135–145)
Total Bilirubin: 0.6 mg/dL (ref 0.3–1.2)
Total Protein: 7 g/dL (ref 6.0–8.3)

## 2011-10-23 LAB — LIPID PANEL
Cholesterol: 192 mg/dL (ref 0–200)
HDL: 64.6 mg/dL (ref >39.00)
LDL Cholesterol: 103 mg/dL — ABNORMAL HIGH (ref 0–99)
Total CHOL/HDL Ratio: 3
Triglycerides: 123 mg/dL (ref 0.0–149.0)
VLDL: 24.6 mg/dL (ref 0.0–40.0)

## 2011-10-23 LAB — TSH: TSH: 2.96 u[IU]/mL (ref 0.35–5.50)

## 2011-12-04 ENCOUNTER — Other Ambulatory Visit: Payer: Self-pay

## 2011-12-04 MED ORDER — VALACYCLOVIR HCL 1 G PO TABS: 500.0000 mg | ORAL_TABLET | Freq: Every day | ORAL | Status: DC

## 2011-12-20 ENCOUNTER — Other Ambulatory Visit: Payer: Self-pay

## 2011-12-20 MED ORDER — LEVOTHYROXINE SODIUM 50 MCG PO TABS: 50.0000 ug | ORAL_TABLET | Freq: Every day | ORAL | Status: DC

## 2011-12-20 NOTE — Telephone Encounter (Signed)
Pt request refill levothyroxine to [facility]. Pt notified done.

## 2011-12-28 ENCOUNTER — Other Ambulatory Visit: Payer: Self-pay | Admitting: Family Medicine

## 2012-02-17 ENCOUNTER — Encounter (HOSPITAL_COMMUNITY): Payer: Self-pay | Admitting: *Deleted

## 2012-02-17 ENCOUNTER — Emergency Department (INDEPENDENT_AMBULATORY_CARE_PROVIDER_SITE_OTHER)
Admission: EM | Admit: 2012-02-17 | Discharge: 2012-02-17 | Disposition: A | Discharge status: Home / Self Care | Payer: Self-pay | Source: Home / Self Care | Attending: Family Medicine | Admitting: Family Medicine

## 2012-02-17 ENCOUNTER — Ambulatory Visit: Payer: BC Managed Care – PPO

## 2012-02-17 DIAGNOSIS — M545 Low back pain, unspecified: Secondary | ICD-10-CM

## 2012-02-17 LAB — POCT URINALYSIS DIP (DEVICE)
Bilirubin Urine: NEGATIVE
Glucose, UA: NEGATIVE mg/dL
Ketones, ur: NEGATIVE mg/dL
Leukocytes, UA: NEGATIVE
Nitrite: NEGATIVE
Protein, ur: NEGATIVE mg/dL
Specific Gravity, Urine: 1.02 (ref 1.005–1.030)
Urobilinogen, UA: 0.2 mg/dL (ref 0.0–1.0)
pH: 7 (ref 5.0–8.0)

## 2012-02-17 MED ORDER — IBUPROFEN 600 MG PO TABS: 600.0000 mg | ORAL_TABLET | Freq: Three times a day (TID) | ORAL | Status: DC | PRN

## 2012-02-17 MED ORDER — TRAMADOL HCL 50 MG PO TABS: 50.0000 mg | ORAL_TABLET | Freq: Four times a day (QID) | ORAL | Status: DC | PRN

## 2012-02-17 MED ORDER — CYCLOBENZAPRINE HCL 10 MG PO TABS: 10.0000 mg | ORAL_TABLET | Freq: Two times a day (BID) | ORAL | Status: DC | PRN
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
## 2012-02-18 ENCOUNTER — Emergency Department (HOSPITAL_COMMUNITY)
Admission: EM | Admit: 2012-02-18 | Discharge: 2012-02-18 | Disposition: A | Discharge status: Home / Self Care | Payer: BC Managed Care – PPO | Source: Home / Self Care | Attending: Emergency Medicine | Admitting: Emergency Medicine

## 2012-02-18 ENCOUNTER — Emergency Department (INDEPENDENT_AMBULATORY_CARE_PROVIDER_SITE_OTHER): Payer: BC Managed Care – PPO

## 2012-02-18 ENCOUNTER — Encounter (HOSPITAL_COMMUNITY): Payer: Self-pay | Admitting: Emergency Medicine

## 2012-02-18 DIAGNOSIS — S335XXA Sprain of ligaments of lumbar spine, initial encounter: Secondary | ICD-10-CM

## 2012-02-18 DIAGNOSIS — M545 Low back pain, unspecified: Secondary | ICD-10-CM

## 2012-02-18 DIAGNOSIS — M5136 Other intervertebral disc degeneration, lumbar region: Secondary | ICD-10-CM

## 2012-02-18 DIAGNOSIS — S39012A Strain of muscle, fascia and tendon of lower back, initial encounter: Secondary | ICD-10-CM

## 2012-02-18 MED ORDER — TIZANIDINE HCL 4 MG PO TABS: 4.0000 mg | ORAL_TABLET | Freq: Four times a day (QID) | ORAL | Status: DC | PRN

## 2012-02-18 MED ORDER — HYDROCODONE-ACETAMINOPHEN 5-325 MG PO TABS: ORAL_TABLET | ORAL | Status: DC

## 2012-02-18 MED ORDER — DICLOFENAC SODIUM 75 MG PO TBEC: 75.0000 mg | DELAYED_RELEASE_TABLET | Freq: Two times a day (BID) | ORAL | Status: DC
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
## 2012-05-27 ENCOUNTER — Other Ambulatory Visit: Payer: Self-pay | Admitting: Obstetrics and Gynecology

## 2012-05-27 DIAGNOSIS — Z1231 Encounter for screening mammogram for malignant neoplasm of breast: Secondary | ICD-10-CM

## 2012-06-17 ENCOUNTER — Ambulatory Visit: Payer: BC Managed Care – PPO

## 2012-10-07 ENCOUNTER — Encounter: Payer: Self-pay | Admitting: Family Medicine

## 2012-10-07 ENCOUNTER — Ambulatory Visit (INDEPENDENT_AMBULATORY_CARE_PROVIDER_SITE_OTHER): Payer: BC Managed Care – PPO | Admitting: Family Medicine

## 2012-10-07 VITALS — BP 88/58 | HR 67 | Temp 98.2°F | Ht 67.5 in | Wt 194.5 lb

## 2012-10-07 DIAGNOSIS — R197 Diarrhea, unspecified: Secondary | ICD-10-CM | POA: Insufficient documentation

## 2012-10-07 DIAGNOSIS — B353 Tinea pedis: Secondary | ICD-10-CM

## 2012-10-07 DIAGNOSIS — E039 Hypothyroidism, unspecified: Secondary | ICD-10-CM

## 2012-10-07 LAB — CBC WITH DIFFERENTIAL/PLATELET
Basophils Absolute: 0 10*3/uL (ref 0.0–0.1)
Basophils Relative: 0.5 % (ref 0.0–3.0)
Eosinophils Absolute: 0.3 10*3/uL (ref 0.0–0.7)
Eosinophils Relative: 4.7 % (ref 0.0–5.0)
HCT: 37.6 % (ref 36.0–46.0)
Hemoglobin: 12.6 g/dL (ref 12.0–15.0)
Lymphocytes Relative: 35.3 % (ref 12.0–46.0)
Lymphs Abs: 1.9 10*3/uL (ref 0.7–4.0)
MCHC: 33.5 g/dL (ref 30.0–36.0)
MCV: 94.7 fl (ref 78.0–100.0)
Monocytes Absolute: 0.5 10*3/uL (ref 0.1–1.0)
Monocytes Relative: 9 % (ref 3.0–12.0)
Neutro Abs: 2.8 10*3/uL (ref 1.4–7.7)
Neutrophils Relative %: 50.5 % (ref 43.0–77.0)
Platelets: 248 10*3/uL (ref 150.0–400.0)
RBC: 3.97 Mil/uL (ref 3.87–5.11)
RDW: 13.4 % (ref 11.5–14.6)
WBC: 5.4 10*3/uL (ref 4.5–10.5)

## 2012-10-07 LAB — COMPREHENSIVE METABOLIC PANEL
ALT: 35 U/L (ref 0–35)
AST: 29 U/L (ref 0–37)
Albumin: 4.1 g/dL (ref 3.5–5.2)
Alkaline Phosphatase: 47 U/L (ref 39–117)
BUN: 15 mg/dL (ref 6–23)
CO2: 27 mEq/L (ref 19–32)
Calcium: 9.3 mg/dL (ref 8.4–10.5)
Chloride: 104 mEq/L (ref 96–112)
Creatinine, Ser: 0.7 mg/dL (ref 0.4–1.2)
GFR: 102.19 mL/min (ref >60.00)
Glucose, Bld: 84 mg/dL (ref 70–99)
Potassium: 3.8 mEq/L (ref 3.5–5.1)
Sodium: 137 mEq/L (ref 135–145)
Total Bilirubin: 0.5 mg/dL (ref 0.3–1.2)
Total Protein: 7.7 g/dL (ref 6.0–8.3)

## 2012-10-07 LAB — TSH: TSH: 2.79 u[IU]/mL (ref 0.35–5.50)

## 2012-10-07 MED ORDER — DIPHENOXYLATE-ATROPINE 2.5-0.025 MG PO TABS: 1.0000 | ORAL_TABLET | Freq: Two times a day (BID) | ORAL | Status: DC | PRN

## 2012-10-07 MED ORDER — KETOCONAZOLE 2 % EX CREA: TOPICAL_CREAM | Freq: Two times a day (BID) | CUTANEOUS | Status: DC

## 2012-10-07 NOTE — Progress Notes (Signed)
Subjective:    Patient ID: Diana Decker, female    DOB: 1965-06-17, 47 y.o.   MRN: [id number]  HPI Here for 10 days of diarrhea (and also foot skin problem) Not getting better  ? What she ate last (did eat junk food at a baseball game and ate some junk food )- pretzel / pizza  Thought initially it was from that  No sick contacts at all , and no one got sick  No overseas travel   At this point - she has at least 4 bms per day- no blood but has Decker mucous , and very watery to very loose  Has not been eating much- bland carbs  Did eat a salad yesterday   Lab Results  Component Value Date   TSH 2.96 10/23/2011     Last time she was on abx - for uri , zithromax and then augmentin  Still takes probiotics   Lots of stress   Last night - really bad with cramping and loud gurgling abd   She has personal hx of IBS ---dx during a time of stress , and controlled by diet/ probiotic in the past  Is diarrhea predominant  Her mother has ? Celiac (markers for it)  She also has problems with L foot- scale and peeling skin on bottom  Thinks it is foot fungus  Tried several otc products no imp  Has not changed shoes    Patient Active Problem List   Diagnosis Date Noted  . Bipolar 1 disorder 06/08/2010  . Hypothyroidism 06/08/2010  . IBS 05/17/2009  . ANXIETY 04/21/2009  . GERD 04/21/2009  . VAGINITIS, CANDIDAL 08/23/2008  . OVERWEIGHT 01/29/2008   Past Medical History  Diagnosis Date  . Anemia   . IBS (irritable bowel syndrome)   . GERD (gastroesophageal reflux disease)   . Anxiety   . Bipolar 1 disorder   . Borderline personality disorder   . Hypothyroidism 06/08/2010   Past Surgical History  Procedure Laterality Date  . Appendectomy    . Hernia repair      inguinal   History  Substance Use Topics  . Smoking status: Never Smoker   . Smokeless tobacco: Never Used  . Alcohol Use: No   Family History  Problem Relation Age of Onset  . Cancer Mother     breast  .  Cancer Father     prostate  . Heart disease Father    No Known Allergies Current Outpatient Prescriptions on File Prior to Visit  Medication Sig Dispense Refill  . lamoTRIgine (LAMICTAL) 25 MG tablet Take 25 mg by mouth daily.        Marland Kitchen levothyroxine (SYNTHROID, LEVOTHROID) 50 MCG tablet Take 1 tablet (50 mcg total) by mouth daily.  30 tablet  11  . Multiple Vitamin (MULTIVITAMIN) tablet Take 1 tablet by mouth daily.        . nefazodone (SERZONE) 200 MG tablet Take 200 mg by mouth daily.        . Probiotic Product (PHILLIPS COLON HEALTH PO) Take 1 tablet by mouth 2 (two) times daily.        . valACYclovir (VALTREX) 1000 MG tablet Take 0.5 tablets (500 mg total) by mouth daily.  30 tablet  0   No current facility-administered medications on file prior to visit.    Review of Systems Review of Systems  Constitutional: Negative for fever, appetite change, fatigue and unexpected weight change.  Eyes: Negative for pain and visual disturbance.  Respiratory:  Negative for cough and shortness of breath.   Cardiovascular: Negative for cp or palpitations    Gastrointestinal: Negative for constipation/ blood in stool/ vomiting, ;pos for nausea  Genitourinary: Negative for urgency and frequency.  Skin: Negative for pallor or rash   Neurological: Negative for weakness, light-headedness, numbness and headaches.  Hematological: Negative for adenopathy. Does not bruise/bleed easily.  Psychiatric/Behavioral: Negative for dysphoric mood. The patient is not nervous/anxious.  pos for stressors        Objective:   Physical Exam  Constitutional: She appears well-developed and well-nourished. No distress.  HENT:  Head: Normocephalic and atraumatic.  Mouth/Throat: Oropharynx is clear and moist.  Eyes: Conjunctivae and EOM are normal. Pupils are equal, round, and reactive to light. No scleral icterus.  Neck: Normal range of motion. Neck supple. No JVD present. Carotid bruit is not present. No thyromegaly  present.  Cardiovascular: Normal rate and regular rhythm.   Pulmonary/Chest: Effort normal and breath sounds normal.  Abdominal: Soft. Bowel sounds are normal. She exhibits no abdominal bruit and no mass. There is tenderness. There is no rebound and no guarding.  Diffuse mild tenderness in bilat LQ without rebound or guarding Nl bs  Musculoskeletal: She exhibits no edema.  Lymphadenopathy:    She has no cervical adenopathy.  Neurological: She is alert. She has normal reflexes. No cranial nerve deficit. She exhibits normal muscle tone. Coordination normal.  No tremor   Skin: Skin is warm and dry. Rash noted. No erythema. No pallor.  No jaundice  L foot- scale and peeling skin on sole with maceration between toes Resembles tinea pedis   Psychiatric: She has a normal mood and affect.          Assessment & Plan:

## 2012-10-07 NOTE — Assessment & Plan Note (Signed)
Ongoing 10 days  Differential incl IBS with flare/ bacterial overgrowth/ c diff/ bact or viral  Lab today incl tsh / cbc and cmet and enc to keep up fluids Also stool dx and c diff Given lomotil to use cautiously for symptoms  Pending result for further plan Update if not starting to improve in a week or if worsening

## 2012-10-07 NOTE — Assessment & Plan Note (Signed)
Will check this in light of new diarrhea  No other clinical changes

## 2012-10-07 NOTE — Patient Instructions (Addendum)
Try the lomotil as needed -if it causes cramping -do not continue Labs for stool culture and C difficile  Also blood test for electrolytes and thyroid Will update you with results Keep up good fluid intake Continue probiotics

## 2012-10-07 NOTE — Assessment & Plan Note (Signed)
L foot  Failed otc meds  Px for ketoconazole cream given for bid use  Disc need to keep feet clean and dry Update if no imp

## 2012-10-07 NOTE — Addendum Note (Signed)
Addended by: Alvina Chou on: 10/07/2012 01:51 PM   Modules accepted: Orders

## 2012-10-08 NOTE — Addendum Note (Signed)
Addended by: Alvina Chou on: 10/08/2012 08:30 AM   Modules accepted: Orders

## 2012-10-09 LAB — CLOSTRIDIUM DIFFICILE EIA: CDIFTX: NEGATIVE

## 2012-10-12 LAB — STOOL CULTURE

## 2012-10-16 ENCOUNTER — Telehealth: Payer: Self-pay | Admitting: Gastroenterology

## 2012-10-16 ENCOUNTER — Telehealth: Payer: Self-pay | Admitting: Family Medicine

## 2012-10-16 DIAGNOSIS — R197 Diarrhea, unspecified: Secondary | ICD-10-CM

## 2012-10-16 NOTE — Telephone Encounter (Signed)
Scheduled pt to see Mike Gip, PA in am ; Armando Reichert will inform the pt.

## 2012-10-16 NOTE — Telephone Encounter (Signed)
lmom for Diana Decker to call back.

## 2012-10-16 NOTE — Telephone Encounter (Signed)
Referring to GI for 3 weeks of diarrhea  Will make referral urgent - [place] may take longer though

## 2012-10-16 NOTE — Telephone Encounter (Signed)
Message copied by Judy Pimple on Thu Oct 16, 2012  9:06 AM ------      Message from: Patience Musca      Created: Thu Oct 16, 2012  8:55 AM       Pt left v/m; pt continuing with diarrhea for total of 3 weeks. Pt request cb [phone]. Pt is agreeable to have a GI referral and prefers to have in [place]. Pt request cb ASAP; pt said diarrhea is worse today. ------

## 2012-10-17 ENCOUNTER — Telehealth: Payer: Self-pay | Admitting: *Deleted

## 2012-10-17 ENCOUNTER — Encounter: Payer: Self-pay | Admitting: Physician Assistant

## 2012-10-17 ENCOUNTER — Ambulatory Visit (INDEPENDENT_AMBULATORY_CARE_PROVIDER_SITE_OTHER): Payer: BC Managed Care – PPO | Admitting: Physician Assistant

## 2012-10-17 VITALS — BP 106/68 | HR 76 | Ht 67.0 in | Wt 194.6 lb

## 2012-10-17 DIAGNOSIS — R197 Diarrhea, unspecified: Secondary | ICD-10-CM

## 2012-10-17 MED ORDER — SACCHAROMYCES BOULARDII 250 MG PO CAPS: 250.0000 mg | ORAL_CAPSULE | Freq: Two times a day (BID) | ORAL | Status: DC

## 2012-10-17 MED ORDER — METRONIDAZOLE 250 MG PO TABS: ORAL_TABLET | ORAL | Status: DC

## 2012-10-17 MED ORDER — DICYCLOMINE HCL 10 MG PO CAPS: ORAL_CAPSULE | ORAL | Status: DC

## 2012-10-17 NOTE — Patient Instructions (Addendum)
Please go to the basement level to our lab for stool studies. We will call you when we have the results. We sent prescriptions to [facility] [address] 1. Flagyl ( metronidazole ) 2. Florastor samples and prescription 3. Bentyl ( dicyclomine )   Bland diet. Push fluids.  We made you a follow up appointment with Mike Gip PA-C on 10-30-2012 at 8:30 am .

## 2012-10-17 NOTE — Telephone Encounter (Signed)
I called the patient and told her to take the Flagyl now,  Even though she could not leave her stool sample.  Diana Esterwood PA-C told me to have the patient take the Flagyl.

## 2012-10-17 NOTE — Progress Notes (Signed)
Subjective:    Patient ID: Diana Decker, female    DOB: Feb 13, 1965, 47 y.o.   MRN: [id number]  HPI  Diana Decker is a pleasant 47 year old white female known to Dr. Jarold Motto from previous procedures. She underwent an evaluation in 2011 for complaints of diarrhea which was felt secondary to IBS. She underwent EGD with small bowel biopsies. The exam was negative and biopsies were benign with no evidence for celiac disease. She also had colonoscopy which was normal area G. had some nodularity in the terminal ileum and biopsies were done which were normal. Patient states that she eliminate it some foods from her diet and also started taking a probiotic and has done fine over the past couple of years. She had a sinus infection earlier this summer Took 2 courses of antibiotics including Augmentin. He says within a month or that she had onset of diarrhea and has now had diarrhea over the past 3 weeks. She has been Decker by primary care and was given Lomotil to use as needed. She also had a stool for C. difficile and stool culture done last week both of which were negative. However the patient says her symptoms are persisting and she feels terrible. She says she has absolutely no energy and is having 8-10 bowel movements per day of 8 watery mucous malodorous stool which has been nonbloody. She is having lower abdominal cramping and discomfort some nausea without vomiting and a decrease in appetite. She's not had any documented fever. She has been trying to work and has been continuing to eat and push fluids. She has no other known infectious exposures travel etc. no family ill. She was placed on Tegretol recently but held this after the diarrhea started and did not notice any change in her symptoms. She has since resumed. No other new medications supplements etc.    Review of Systems  Constitutional: Positive for fatigue.  HENT: Negative.   Eyes: Negative.   Respiratory: Negative.   Cardiovascular: Negative.    Gastrointestinal: Positive for nausea, abdominal pain and diarrhea.  Endocrine: Negative.   Genitourinary: Negative.   Allergic/Immunologic: Negative.   Neurological: Positive for weakness.  Hematological: Negative.   Psychiatric/Behavioral: Negative.    Outpatient Prescriptions Prior to Visit  Medication Sig Dispense Refill  . carbamazepine (EPITOL) 200 MG tablet Take 200 mg by mouth at bedtime.      . diphenoxylate-atropine (LOMOTIL) 2.5-0.025 MG per tablet Take 1 tablet by mouth 2 (two) times daily as needed for diarrhea or loose stools.  15 tablet  0  . ketoconazole (NIZORAL) 2 % cream Apply topically 2 (two) times daily. Apply to affected area  30 g  0  . lamoTRIgine (LAMICTAL) 25 MG tablet Take 25 mg by mouth daily.        Marland Kitchen levothyroxine (SYNTHROID, LEVOTHROID) 50 MCG tablet Take 1 tablet (50 mcg total) by mouth daily.  30 tablet  11  . Multiple Vitamin (MULTIVITAMIN) tablet Take 1 tablet by mouth daily.        . nefazodone (SERZONE) 200 MG tablet Take 200 mg by mouth daily.        . Probiotic Product (PHILLIPS COLON HEALTH PO) Take 1 tablet by mouth 2 (two) times daily.        . valACYclovir (VALTREX) 1000 MG tablet Take 0.5 tablets (500 mg total) by mouth daily.  30 tablet  0   No facility-administered medications prior to visit.   No Known Allergies Patient Active Problem List   Diagnosis  Date Noted  . Diarrhea 10/07/2012  . Tinea pedis 10/07/2012  . Bipolar 1 disorder 06/08/2010  . Hypothyroidism 06/08/2010  . IBS 05/17/2009  . ANXIETY 04/21/2009  . GERD 04/21/2009  . VAGINITIS, CANDIDAL 08/23/2008  . OVERWEIGHT 01/29/2008   History  Substance Use Topics  . Smoking status: Never Smoker   . Smokeless tobacco: Never Used  . Alcohol Use: No   family history includes Breast cancer in her mother; Heart disease in her father; Prostate cancer in her father. There is no history of Colon cancer.     Objective:   Physical Exam  well-developed white female in no acute  distress, pleasant blood pressure 106/68 pulse 76 height 5 foot 7 weight 194. HEENT; nontraumatic normocephalic EOMI PERRLA sclera anicteric, Supple; no JVD, Cardiovascular ;regular rate and rhythm with S1-S2 no murmur or gallop, Pulmonary ;clear bilaterally, Abdomen ;soft is mildly tender bilaterally in the lower quadrants there is no guarding or rebound no palpable mass or hepatosplenomegaly, Rectal; exam not done, Extremities ;no clubbing cyanosis or edema skin warm and dry, Psych; mood and affect appropriate        Assessment & Plan:  #62 47 year old female with history of IBS now with 3 week history of acute illness with diarrhea lower abdominal  cramping, nausea, and fatigue   Previous culture for C. difficile was negative however still have high suspicion for C. difficile-induced diarrhea. Other infectious etiologies need to be considered  Plan; will check GI pathogen panel Patient will continue bland diet and push oral fluids She is advised to use Lomotil very sparingly and avoid if possible Start Bentyl 10 mg 3 times daily as needed for cramping and spasm Start  Metronidazole 250 mg by mouth 4 times daily x14 days Start Florastor  one by mouth twice daily x2 weeks Return office visit in 2-3 weeks. Patient is aware to that she should call in the interim for worsening of her symptoms. Further plans pending results of GI pathogen panel .

## 2012-10-27 ENCOUNTER — Other Ambulatory Visit: Payer: BC Managed Care – PPO

## 2012-10-27 DIAGNOSIS — R197 Diarrhea, unspecified: Secondary | ICD-10-CM

## 2012-10-28 LAB — GASTROINTESTINAL PATHOGEN PANEL PCR
C. difficile Tox A/B, PCR: POSITIVE — CR
Campylobacter, PCR: NEGATIVE
Cryptosporidium, PCR: NEGATIVE
E coli (ETEC) LT/ST PCR: NEGATIVE
E coli (STEC) stx1/stx2, PCR: NEGATIVE
E coli 0157, PCR: NEGATIVE
Giardia lamblia, PCR: NEGATIVE
Norovirus, PCR: NEGATIVE
Rotavirus A, PCR: NEGATIVE
Salmonella, PCR: POSITIVE — CR
Shigella, PCR: NEGATIVE

## 2012-10-29 ENCOUNTER — Telehealth: Payer: Self-pay | Admitting: *Deleted

## 2012-10-29 NOTE — Telephone Encounter (Signed)
[facility] Triage Call Report Triage Record Num: [id number] Operator: April Gaither Patient Name: Diana Decker [facility] Call Date & Time: 10/28/2012 9:38:55PM Patient Phone: [phone] PCP: Patient Gender: Female PCP Fax : Patient DOB: 1965-12-06 Practice Name: [facility] Reason for Call: Caller: [name]; PCP: Other; CB#: [phone]; Call regarding [name] is calling from [place] regarding a GI panel ordered on [name] by Other.; C Diff Tox A and Bpositive. Salmonella- positive. Contacted Dr. Amador Cunas who states that the C Diff is covered with the current Flagyl treatment . Provider states that PCP will handle the rest tomorrow. Protocol(s) Used: Office Note Recommended Outcome per Protocol: Information Noted and Sent to Office Reason for Outcome: Caller information to office Care Advice: ~ 09/

## 2012-10-30 ENCOUNTER — Ambulatory Visit (INDEPENDENT_AMBULATORY_CARE_PROVIDER_SITE_OTHER): Payer: BC Managed Care – PPO | Admitting: Physician Assistant

## 2012-10-30 ENCOUNTER — Encounter: Payer: Self-pay | Admitting: Physician Assistant

## 2012-10-30 VITALS — BP 104/60 | HR 76 | Ht 67.5 in | Wt 197.2 lb

## 2012-10-30 DIAGNOSIS — A0472 Enterocolitis due to Clostridium difficile, not specified as recurrent: Secondary | ICD-10-CM

## 2012-10-30 DIAGNOSIS — R197 Diarrhea, unspecified: Secondary | ICD-10-CM

## 2012-10-30 MED ORDER — VANCOMYCIN HCL 250 MG PO CAPS: 250.0000 mg | ORAL_CAPSULE | Freq: Four times a day (QID) | ORAL | Status: DC

## 2012-10-30 MED ORDER — DICYCLOMINE HCL 10 MG PO CAPS: 10.0000 mg | ORAL_CAPSULE | Freq: Three times a day (TID) | ORAL | Status: DC

## 2012-10-30 MED ORDER — SACCHAROMYCES BOULARDII 250 MG PO CAPS: 250.0000 mg | ORAL_CAPSULE | Freq: Two times a day (BID) | ORAL | Status: DC

## 2012-10-30 NOTE — Progress Notes (Signed)
Subjective:    Patient ID: Diana Decker, female    DOB: [birth year], 47 y.o.   MRN: [id number]  HPI  Diana Decker is a pleasant 78 role white female known to Dr. Jarold Motto who was last Decker in the office by myself on 10/17/2012. At that time she had presented with history of IBS and 3 week history of an acute illness with diarrhea lower abdominal  cramping nausea and fatigue. She had had a stool for C. difficile toxin which was negative however still have high suspicion for C. difficile and she had onset of her symptoms after a couple of courses of antibiotics. She was Flagyl 254 times daily x14 days, floor store and a stool pathogen panel was obtained. She did not collect the pathogen panel until earlier this week however it did show C. difficile PCR positive as well as Salmonella PCR positive. She comes back in today for followup, and states that she feels a lot better than she did however her symptoms have not completely resolved. She is no longer having any low-grade fevers or sweats. No nausea or vomiting and is eating without difficulty. She says she still has some tenderness and soreness especially in the right side of her abdomen. She has completed a course of Flagyl area she had been having voluminous diarrhea and is now having 3-4 bowel movements per day which are loose and nonbloody but still malodorous.    Review of Systems  Constitutional: Negative.   HENT: Negative.   Eyes: Negative.   Respiratory: Negative.   Cardiovascular: Negative.   Gastrointestinal: Positive for abdominal pain and diarrhea.  Endocrine: Negative.   Genitourinary: Negative.   Musculoskeletal: Negative.   Neurological: Negative.   Hematological: Negative.   Psychiatric/Behavioral: Negative.    Outpatient Prescriptions Prior to Visit  Medication Sig Dispense Refill  . carbamazepine (EPITOL) 200 MG tablet Take 200 mg by mouth at bedtime.      Marland Kitchen ketoconazole (NIZORAL) 2 % cream Apply topically 2 (two) times daily.  Apply to affected area  30 g  0  . lamoTRIgine (LAMICTAL) 25 MG tablet Take 25 mg by mouth daily.        Marland Kitchen levothyroxine (SYNTHROID, LEVOTHROID) 50 MCG tablet Take 1 tablet (50 mcg total) by mouth daily.  30 tablet  11  . Multiple Vitamin (MULTIVITAMIN) tablet Take 1 tablet by mouth daily.        . nefazodone (SERZONE) 200 MG tablet Take 200 mg by mouth daily.        . valACYclovir (VALTREX) 1000 MG tablet Take 0.5 tablets (500 mg total) by mouth daily.  30 tablet  0  . saccharomyces boulardii (FLORASTOR) 250 MG capsule Take 1 capsule (250 mg total) by mouth 2 (two) times daily.  20 capsule  0  . dicyclomine (BENTYL) 10 MG capsule Take 1 tab 3 times daily as needed for cramping.  40 capsule  0  . diphenoxylate-atropine (LOMOTIL) 2.5-0.025 MG per tablet Take 1 tablet by mouth 2 (two) times daily as needed for diarrhea or loose stools.  15 tablet  0  . metroNIDAZOLE (FLAGYL) 250 MG tablet Take 1 tab 4 times daily for 14 days.  56 tablet  0  . Probiotic Product (PHILLIPS COLON HEALTH PO) Take 1 tablet by mouth 2 (two) times daily.         No facility-administered medications prior to visit.     No Known Allergies   Patient Active Problem List   Diagnosis Date Noted  .  Diarrhea 10/07/2012  . Tinea pedis 10/07/2012  . Bipolar 1 disorder 06/08/2010  . Hypothyroidism 06/08/2010  . IBS 05/17/2009  . ANXIETY 04/21/2009  . GERD 04/21/2009  . VAGINITIS, CANDIDAL 08/23/2008  . OVERWEIGHT 01/29/2008   History  Substance Use Topics  . Smoking status: Never Smoker   . Smokeless tobacco: Never Used  . Alcohol Use: No   family history includes Breast cancer in her mother; Heart disease in her father; Prostate cancer in her father. There is no history of Colon cancer.   Objective:   Physical Exam   well-developed white female in no acute distress, pleasant blood pressure 104/60 pulse 76 height 5 foot 7 weight 197. HEENT; nontraumatic normocephalic EOMI PERRLA sclera anicteric, Cardiovascula;r  regular rate and rhythm with S1-S2 no murmur or gallop, Pulmonary; clear bilaterally, Abdomen; soft mildly tender in the right midabdomen there is no guarding or rebound no palpable mass or hepatosplenomegaly bowel sounds are active, Rectal ;exam not done, Extremities; no clubbing cyanosis or edema skin warm and dry, Psych; mood and affect normal and appropriate        Assessment & Plan:   #56 47 year old female with history of IBS with acute C. differential colitis, partially responsive to metronidazole. #2 pathogen panel also positive for salmonella-will not treat as generally clears without antibiotics  Plan; refill been until 10 mg by mouth 3 times daily as needed for abdominal cramping We'll treat with a two-week course of vancomycin 250 mg by mouth 4 times daily to be sure she clears the C. Difficile.  Continue floor store one by mouth twice daily x1 more month Patient is asked to call when she completes the vancomycin with a progress report and/or if she has any relapse of symptoms after she completes vancomycin and would extend up a course at that point.

## 2012-10-30 NOTE — Patient Instructions (Addendum)
We sent prescriptions to [facility] [address] [place] for ; 1.  Vancomycin. Take 1 tab 4 times daily x 14 days.  2. Bentyl ( dicyclomine ) 3. Florastor probiotic  Call with a progress report after 2 weeks.

## 2012-10-31 NOTE — Telephone Encounter (Signed)
Forwarded to treating GI office.

## 2013-01-17 ENCOUNTER — Other Ambulatory Visit: Payer: Self-pay | Admitting: Family Medicine

## 2013-02-09 ENCOUNTER — Other Ambulatory Visit: Payer: Self-pay | Admitting: Family Medicine

## 2013-07-11 ENCOUNTER — Encounter (HOSPITAL_COMMUNITY): Payer: Self-pay | Admitting: Emergency Medicine

## 2013-07-11 ENCOUNTER — Emergency Department (HOSPITAL_COMMUNITY)
Admission: EM | Admit: 2013-07-11 | Discharge: 2013-07-12 | Disposition: A | Discharge status: Home / Self Care | Payer: BC Managed Care – PPO | Attending: Emergency Medicine | Admitting: Emergency Medicine

## 2013-07-11 DIAGNOSIS — F329 Major depressive disorder, single episode, unspecified: Secondary | ICD-10-CM

## 2013-07-11 DIAGNOSIS — Z79899 Other long term (current) drug therapy: Secondary | ICD-10-CM | POA: Insufficient documentation

## 2013-07-11 DIAGNOSIS — F3130 Bipolar disorder, current episode depressed, mild or moderate severity, unspecified: Secondary | ICD-10-CM | POA: Insufficient documentation

## 2013-07-11 DIAGNOSIS — E039 Hypothyroidism, unspecified: Secondary | ICD-10-CM | POA: Insufficient documentation

## 2013-07-11 DIAGNOSIS — F319 Bipolar disorder, unspecified: Secondary | ICD-10-CM

## 2013-07-11 DIAGNOSIS — Z8719 Personal history of other diseases of the digestive system: Secondary | ICD-10-CM | POA: Insufficient documentation

## 2013-07-11 DIAGNOSIS — R45851 Suicidal ideations: Secondary | ICD-10-CM

## 2013-07-11 DIAGNOSIS — F411 Generalized anxiety disorder: Secondary | ICD-10-CM

## 2013-07-11 DIAGNOSIS — Z862 Personal history of diseases of the blood and blood-forming organs and certain disorders involving the immune mechanism: Secondary | ICD-10-CM | POA: Insufficient documentation

## 2013-07-11 DIAGNOSIS — F32A Depression, unspecified: Secondary | ICD-10-CM

## 2013-07-11 DIAGNOSIS — G479 Sleep disorder, unspecified: Secondary | ICD-10-CM | POA: Insufficient documentation

## 2013-07-11 LAB — COMPREHENSIVE METABOLIC PANEL
ALT: 12 U/L (ref 0–35)
AST: 14 U/L (ref 0–37)
Albumin: 3.7 g/dL (ref 3.5–5.2)
Alkaline Phosphatase: 62 U/L (ref 39–117)
BUN: 11 mg/dL (ref 6–23)
CO2: 27 mEq/L (ref 19–32)
Calcium: 9.3 mg/dL (ref 8.4–10.5)
Chloride: 102 mEq/L (ref 96–112)
Creatinine, Ser: 0.71 mg/dL (ref 0.50–1.10)
GFR calc Af Amer: >90 mL/min (ref >90)
GFR calc non Af Amer: >90 mL/min (ref >90)
Glucose, Bld: 90 mg/dL (ref 70–99)
Potassium: 4 mEq/L (ref 3.7–5.3)
Sodium: 141 mEq/L (ref 137–147)
Total Bilirubin: 0.3 mg/dL (ref 0.3–1.2)
Total Protein: 6.9 g/dL (ref 6.0–8.3)

## 2013-07-11 LAB — RAPID URINE DRUG SCREEN, HOSP PERFORMED
Amphetamines: NOT DETECTED
Barbiturates: NOT DETECTED
Benzodiazepines: NOT DETECTED
Cocaine: NOT DETECTED
Opiates: NOT DETECTED
Tetrahydrocannabinol: NOT DETECTED

## 2013-07-11 LAB — CBC
HCT: 34.9 % — ABNORMAL LOW (ref 36.0–46.0)
Hemoglobin: 11.5 g/dL — ABNORMAL LOW (ref 12.0–15.0)
MCH: 30.8 pg (ref 26.0–34.0)
MCHC: 33 g/dL (ref 30.0–36.0)
MCV: 93.6 fL (ref 78.0–100.0)
Platelets: 279 10*3/uL (ref 150–400)
RBC: 3.73 MIL/uL — ABNORMAL LOW (ref 3.87–5.11)
RDW: 13.6 % (ref 11.5–15.5)
WBC: 5 10*3/uL (ref 4.0–10.5)

## 2013-07-11 LAB — ETHANOL: Alcohol, Ethyl (B): <11 mg/dL (ref 0–11)

## 2013-07-11 MED ORDER — NEFAZODONE HCL 200 MG PO TABS: 200.0000 mg | ORAL_TABLET | Freq: Every day | ORAL | Status: DC

## 2013-07-11 MED ORDER — ZOLPIDEM TARTRATE 5 MG PO TABS
5.0000 mg | ORAL_TABLET | Freq: Every evening | ORAL | Status: DC | PRN
  Administered 2013-07-11: 5 mg via ORAL
  Filled 2013-07-11: qty 1

## 2013-07-11 MED ORDER — SACCHAROMYCES BOULARDII 250 MG PO CAPS: 250.0000 mg | ORAL_CAPSULE | Freq: Two times a day (BID) | ORAL | Status: DC

## 2013-07-11 MED ORDER — LAMOTRIGINE 25 MG PO TABS
75.0000 mg | ORAL_TABLET | Freq: Every day | ORAL | Status: DC
  Administered 2013-07-12: 75 mg via ORAL
  Filled 2013-07-11: qty 3

## 2013-07-11 MED ORDER — LORAZEPAM 1 MG PO TABS: 1.0000 mg | ORAL_TABLET | Freq: Three times a day (TID) | ORAL | Status: DC | PRN

## 2013-07-11 MED ORDER — NICOTINE 21 MG/24HR TD PT24: 21.0000 mg | MEDICATED_PATCH | Freq: Every day | TRANSDERMAL | Status: DC

## 2013-07-11 MED ORDER — CARBAMAZEPINE 200 MG PO TABS: 200.0000 mg | ORAL_TABLET | Freq: Every day | ORAL | Status: DC

## 2013-07-11 MED ORDER — ONDANSETRON HCL 4 MG PO TABS: 4.0000 mg | ORAL_TABLET | Freq: Three times a day (TID) | ORAL | Status: DC | PRN

## 2013-07-11 MED ORDER — NEFAZODONE HCL 100 MG PO TABS
100.0000 mg | ORAL_TABLET | Freq: Two times a day (BID) | ORAL | Status: DC
  Administered 2013-07-11 – 2013-07-12 (×2): 100 mg via ORAL
  Filled 2013-07-11 (×5): qty 1

## 2013-07-11 MED ORDER — DICYCLOMINE HCL 10 MG PO CAPS: 10.0000 mg | ORAL_CAPSULE | Freq: Three times a day (TID) | ORAL | Status: DC

## 2013-07-11 MED ORDER — ARIPIPRAZOLE 2 MG PO TABS
2.0000 mg | ORAL_TABLET | Freq: Every day | ORAL | Status: DC
  Administered 2013-07-11: 2 mg via ORAL
  Filled 2013-07-11 (×2): qty 1

## 2013-07-11 MED ORDER — LEVOTHYROXINE SODIUM 50 MCG PO TABS
50.0000 ug | ORAL_TABLET | Freq: Every day | ORAL | Status: DC
  Administered 2013-07-12: 50 ug via ORAL
  Filled 2013-07-11 (×2): qty 1

## 2013-07-11 MED ORDER — VALACYCLOVIR HCL 500 MG PO TABS: 500.0000 mg | ORAL_TABLET | Freq: Every day | ORAL | Status: DC

## 2013-07-11 MED ORDER — ACETAMINOPHEN 325 MG PO TABS: 650.0000 mg | ORAL_TABLET | ORAL | Status: DC | PRN

## 2013-07-11 MED ORDER — IBUPROFEN 200 MG PO TABS
600.0000 mg | ORAL_TABLET | Freq: Three times a day (TID) | ORAL | Status: DC | PRN
  Administered 2013-07-11: 600 mg via ORAL
  Filled 2013-07-11: qty 3

## 2013-07-11 MED ORDER — ALUM & MAG HYDROXIDE-SIMETH 200-200-20 MG/5ML PO SUSP: 30.0000 mL | ORAL | Status: DC | PRN

## 2013-07-11 MED ORDER — LAMOTRIGINE 25 MG PO TABS: 25.0000 mg | ORAL_TABLET | Freq: Every day | ORAL | Status: DC

## 2013-07-11 MED ORDER — NEFAZODONE HCL 100 MG PO TABS: 100.0000 mg | ORAL_TABLET | Freq: Two times a day (BID) | ORAL | Status: DC

## 2013-07-11 NOTE — ED Notes (Signed)
Patient denies SI, HI, AVH at present. Contracts for safety on the unit. States anxiety is moderate, around 5/10. Unable to quantify feelings of depression, states she is improved since the morning. States she is less tearful. Reports insomnia for last 2 to 3 weeks consistently. States she wakes thinking about work with "racing thoughts and obsessive thoughts". States she has had a lot of overtime at work and this along with recent medication changes is likely causing her problems.   Encouragement offered.   Patient safety maintained, Q 15 checks continue.

## 2013-07-11 NOTE — ED Notes (Signed)
Pt states she was having persistent suicidal thoughts this morning.  Hx of same.  Denies HI.  Pt does not want to answer when asked about a specific plan.  Pt states she is in recovery from drugs and alcohol.

## 2013-07-11 NOTE — ED Notes (Signed)
States has been working a lot this past month and can't seem to think about anything but work. "I have obsessive thoughts about work. That is all I think about." Also states she is getting very little sleep. Her doctor recently changed her meds and states is not working. Stated this morning she was crying a lot, confused, had persistent thoughts of suicide. Now she is laughing and watching sponge bob on TV.

## 2013-07-11 NOTE — ED Provider Notes (Signed)
CSN: [id number]     Arrival date & time 07/11/13  1429 History  This chart was scribed for Diana Fordyce, PA-C  working with Provider Default, MD by Stacy Gardner, ED scribe. This patient was seen in room [facility]40/[facility] and the patient's care was started at 3:14 PM.  First MD Initiated Contact with Patient 07/11/13 1501     Chief Complaint  Patient presents with  . Medical Clearance     (Consider location/radiation/quality/duration/timing/severity/associated sxs/prior Treatment) The history is provided by the patient and medical records. No language interpreter was used.   HPI Comments: Diana Decker is a 48 y.o. female who is in recovery from drugs and alcohol presents to the Emergency Department for Medical Clearance. Pt reports having persistent suicidal thoughts and crying since waking this morning. She had a recent changed of her medication and her psychiatrist advised her to come to the ED today. 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 She is having difficulty sleeping at night and finds concentrating more difficult. Pt states, " I feel miserable".  Denies HI. Pt has access to guns at home but she states that she does not intend to use them.  She has a past medical hx of depression, bipolar 1 disorder and anxiety.   Past Medical History  Diagnosis Date  . Anemia   . IBS (irritable bowel syndrome)   . GERD (gastroesophageal reflux disease)   . Anxiety   . Bipolar 1 disorder   . Borderline personality disorder   . Hypothyroidism 06/08/2010  . Alcoholism   . Depression    Past Surgical History  Procedure Laterality Date  . Appendectomy    . Hernia repair      inguinal   Family History  Problem Relation Age of Onset  . Breast cancer Mother   . Prostate cancer Father   . Heart disease Father   . Colon cancer Neg Hx    History  Substance Use Topics  . Smoking status: Never Smoker   . Smokeless  tobacco: Never Used  . Alcohol Use: No   OB History   Grav Para Term Preterm Abortions TAB SAB Ect Mult Living                 Review of Systems  Psychiatric/Behavioral: Positive for suicidal ideas, sleep disturbance, dysphoric mood and decreased concentration.  All other systems reviewed and are negative.     Allergies  Review of patient's allergies indicates no known allergies.  Home Medications   Prior to Admission medications   Medication Sig Start Date End Date Taking? Authorizing Provider  asenapine (SAPHRIS) 5 MG SUBL 24 hr tablet Place 5 mg under the tongue at bedtime.   Yes Historical Provider, MD  lamoTRIgine (LAMICTAL) 25 MG tablet Take 75 mg by mouth daily.   Yes Historical Provider, MD  levothyroxine (SYNTHROID, LEVOTHROID) 50 MCG tablet Take 50 mcg by mouth daily before breakfast.   Yes Historical Provider, MD  nefazodone (SERZONE) 200 MG tablet Take 400 mg by mouth daily.   Yes Historical Provider, MD  Probiotic Product (Iraan) Take 1 tablet by mouth daily at 2 PM daily at 2 PM.   Yes Historical Provider, MD  valACYclovir (VALTREX) 1000 MG tablet Take 1,000 mg by mouth 2 (two) times daily.   Yes Historical Provider, MD   BP 122/67  Pulse 67  Temp(Src) 98.1 F (36.7 C) (Oral)  Resp  18  SpO2 100%  LMP 07/11/2013 Physical Exam  Nursing note and vitals reviewed. Constitutional: She is oriented to person, place, and time. She appears well-developed and well-nourished.  HENT:  Head: Normocephalic and atraumatic.  Eyes: EOM are normal.  Neck: Normal range of motion.  Cardiovascular: Normal rate.   Pulmonary/Chest: Effort normal.  Musculoskeletal: Normal range of motion.  Neurological: She is alert and oriented to person, place, and time.  Skin: Skin is warm and dry.  Psychiatric: Her speech is normal. She exhibits a depressed mood. She expresses suicidal ideation. She expresses no homicidal ideation. She expresses no suicidal plans.    ED  Course  Procedures (including critical care time) DIAGNOSTIC STUDIES: Oxygen Saturation is 100% on room air, normal by my interpretation.    COORDINATION OF CARE:  5:15 PM Discussed course of care with pt . Pt understands and agrees.    Labs Review Labs Reviewed  CBC - Abnormal; Notable for the following:    RBC 3.73 (*)    Hemoglobin 11.5 (*)    HCT 34.9 (*)    All other components within normal limits  COMPREHENSIVE METABOLIC PANEL  ETHANOL  URINE RAPID DRUG SCREEN (HOSP PERFORMED)    Imaging Review No results found.   EKG Interpretation None      MDM   Final diagnoses:  Suicidal ideation  Bipolar 1 disorder  Depressed   Pt presenting with SI w/o specific plan. Denies HI.  States she is in recovery from drugs and Etoh.  Pt medically cleared to be evaluated by TTS for disposition.  Psych hold orders placed.   Pt evaluated by psychiatry, plan is to reevaluate pt in am to determine disposition.  I personally performed the services described in this documentation, which was scribed in my presence. The recorded information has been reviewed and is accurate.      Diana Fordyce, PA-C 07/11/13 [phone]

## 2013-07-11 NOTE — Consult Note (Signed)
[facility] Consult   Reason for Consult:  Depression with suicidal ideation Referring Physician:  EDP  Diana Decker is an 48 y.o. female. Total Time spent with patient: 20 minutes  Assessment: AXIS I:  Bipolar, Depressed; Alcohol and opiate dependency (clean for 13 years) AXIS II:  Deferred AXIS III:   Past Medical History  Diagnosis Date  . Anemia   . IBS (irritable bowel syndrome)   . GERD (gastroesophageal reflux disease)   . Anxiety   . Bipolar 1 disorder   . Borderline personality disorder   . Hypothyroidism 06/08/2010  . Alcoholism   . Depression    AXIS IV:  occupational problems AXIS V:  41-50 serious symptoms  Plan:  Supportive therapy provided about ongoing stressors.  Adjust medications and reassess in am.  Subjective:   Diana Decker is a 48 y.o. female patient will be monitored over night after medication adjustments.  HPI:  Diana Decker awakened this am with persistent suicidal ideations and crying.  She contributes her depression to work stressors, not sleeping because of obsessive work thoughts, and recent medication changes.  The patient was only taking Serzone 100 mg daily but is suppose to take it BID.  Despite medication potential side effects, the patient requests to stay on this medication because it has been effective for her.  She has not been taking her Tegretol and should not continue due to interactions with Serzone.  Diana Decker takes 75 mg of Lamictal daily and stopped taking Saphris at night because of increase in appetite.  She had recently changed from Depakote to Saphris because of a 40 lb wt. Gain.  Saphris not continued.  Her Lamictal was increased years ago and she became manic so they lowered her dose, unsure if she was on an antidepressant at the time but does not want to increase it.  Abilify 2 mg at bedtime started for mood stability.  Patient of Dr. Toy Care in [place].  Two previous hospitalizations at [facility] and [facility].  Diagnosed in 2002  with bipolar disorder after someone prescribed an antidepressant and she became manic.  Kamiryn stated she came to the ED since she knew her stability was not good.  Past use of alcohol and opiate dependency, last use was 13 years ago.  Lives with her supportive husband and works as a Mining engineer.  Expresses depressed mood but affect is euthymic with sporadic laughing at times.  Denies hallucinations.   HPI Elements:   Location:  generalized. Quality:  acute. Severity:  moderate to severe. Timing:  constant. Duration:  today. Context:  work stressors.  Past Psychiatric History: Past Medical History  Diagnosis Date  . Anemia   . IBS (irritable bowel syndrome)   . GERD (gastroesophageal reflux disease)   . Anxiety   . Bipolar 1 disorder   . Borderline personality disorder   . Hypothyroidism 06/08/2010  . Alcoholism   . Depression     reports that she has never smoked. She has never used smokeless tobacco. She reports that she does not drink alcohol or use illicit drugs. Family History  Problem Relation Age of Onset  . Breast cancer Mother   . Prostate cancer Father   . Heart disease Father   . Colon cancer Neg Hx            Allergies:  No Known Allergies  ACT Assessment Complete:  Yes:    Educational Status    Risk to Self: Risk to self Is patient at  risk for suicide?: Yes Substance abuse history and/or treatment for substance abuse?: No  Risk to Others:    Abuse:    Prior Inpatient Therapy:    Prior Outpatient Therapy:    Additional Information:                    Objective: Blood pressure 122/67, pulse 67, temperature 98.1 F (36.7 C), temperature source Oral, resp. rate 18, last menstrual period 07/11/2013, SpO2 100.00%.There is no weight on file to calculate BMI. Results for orders placed during the hospital encounter of 07/11/13 (from the past 72 hour(s))  CBC     Status: Abnormal   Collection Time    07/11/13  3:11 PM      Result  Value Ref Range   WBC 5.0  4.0 - 10.5 K/uL   RBC 3.73 (*) 3.87 - 5.11 MIL/uL   Hemoglobin 11.5 (*) 12.0 - 15.0 g/dL   HCT 34.9 (*) 36.0 - 46.0 %   MCV 93.6  78.0 - 100.0 fL   MCH 30.8  26.0 - 34.0 pg   MCHC 33.0  30.0 - 36.0 g/dL   RDW 13.6  11.5 - 15.5 %   Platelets 279  150 - 400 K/uL  COMPREHENSIVE METABOLIC PANEL     Status: None   Collection Time    07/11/13  3:11 PM      Result Value Ref Range   Sodium 141  137 - 147 mEq/L   Potassium 4.0  3.7 - 5.3 mEq/L   Chloride 102  96 - 112 mEq/L   CO2 27  19 - 32 mEq/L   Glucose, Bld 90  70 - 99 mg/dL   BUN 11  6 - 23 mg/dL   Creatinine, Ser 0.71  0.50 - 1.10 mg/dL   Calcium 9.3  8.4 - 10.5 mg/dL   Total Protein 6.9  6.0 - 8.3 g/dL   Albumin 3.7  3.5 - 5.2 g/dL   AST 14  0 - 37 U/L   ALT 12  0 - 35 U/L   Alkaline Phosphatase 62  39 - 117 U/L   Total Bilirubin 0.3  0.3 - 1.2 mg/dL   GFR calc non Af Amer >90  >90 mL/min   GFR calc Af Amer >90  >90 mL/min   Comment: (NOTE)     The eGFR has been calculated using the CKD EPI equation.     This calculation has not been validated in all clinical situations.     eGFR's persistently <90 mL/min signify possible Chronic Kidney     Disease.  ETHANOL     Status: None   Collection Time    07/11/13  3:11 PM      Result Value Ref Range   Alcohol, Ethyl (B) <11  0 - 11 mg/dL   Comment:            LOWEST DETECTABLE LIMIT FOR     SERUM ALCOHOL IS 11 mg/dL     FOR MEDICAL PURPOSES ONLY  URINE RAPID DRUG SCREEN (HOSP PERFORMED)     Status: None   Collection Time    07/11/13  3:29 PM      Result Value Ref Range   Opiates NONE DETECTED  NONE DETECTED   Cocaine NONE DETECTED  NONE DETECTED   Benzodiazepines NONE DETECTED  NONE DETECTED   Amphetamines NONE DETECTED  NONE DETECTED   Tetrahydrocannabinol NONE DETECTED  NONE DETECTED   Barbiturates NONE DETECTED  NONE DETECTED  Comment:            DRUG SCREEN FOR MEDICAL PURPOSES     ONLY.  IF CONFIRMATION IS NEEDED     FOR ANY PURPOSE,  NOTIFY LAB     WITHIN 5 DAYS.                LOWEST DETECTABLE LIMITS     FOR URINE DRUG SCREEN     Drug Class       Cutoff (ng/mL)     Amphetamine      1000     Barbiturate      200     Benzodiazepine   829     Tricyclics       937     Opiates          300     Cocaine          300     THC              50   Labs are reviewed and are pertinent for slight anemia.  Current Facility-Administered Medications  Medication Dose Route Frequency Provider Last Rate Last Dose  . acetaminophen (TYLENOL) tablet 650 mg  650 mg Oral Q4H PRN [name], PA-C      . alum & mag hydroxide-simeth (MAALOX/MYLANTA) 200-200-20 MG/5ML suspension 30 mL  30 mL Oral PRN [name], PA-C      . ARIPiprazole (ABILIFY) tablet 2 mg  2 mg Oral Daily Waylan Boga, NP      . dicyclomine (BENTYL) capsule 10 mg  10 mg Oral TID AC & HS [name], PA-C      . ibuprofen (ADVIL,MOTRIN) tablet 600 mg  600 mg Oral Q8H PRN [name], PA-C      . Derrill Memo ON 07/12/2013] lamoTRIgine (LAMICTAL) tablet 75 mg  75 mg Oral Daily Waylan Boga, NP      . Derrill Memo ON 07/12/2013] levothyroxine (SYNTHROID, LEVOTHROID) tablet 50 mcg  50 mcg Oral QAC breakfast [name], PA-C      . nefazodone (SERZONE) tablet 100 mg  100 mg Oral BID Waylan Boga, NP      . nicotine (NICODERM CQ - dosed in mg/24 hours) patch 21 mg  21 mg Transdermal Daily [name], PA-C      . ondansetron (ZOFRAN) tablet 4 mg  4 mg Oral Q8H PRN [name], PA-C      . saccharomyces boulardii (FLORASTOR) capsule 250 mg  250 mg Oral BID [name], PA-C      . valACYclovir (VALTREX) tablet 500 mg  500 mg Oral Daily [name], PA-C      . zolpidem (AMBIEN) tablet 5 mg  5 mg Oral QHS PRN [name], PA-C       Current Outpatient Prescriptions  Medication Sig Dispense Refill  . asenapine (SAPHRIS) 5 MG SUBL 24 hr tablet Place 5 mg under the tongue at bedtime.      . lamoTRIgine (LAMICTAL) 25 MG tablet Take 75 mg by mouth daily.      Marland Kitchen  levothyroxine (SYNTHROID, LEVOTHROID) 50 MCG tablet Take 50 mcg by mouth daily before breakfast.      . nefazodone (SERZONE) 200 MG tablet Take 400 mg by mouth daily.      . valACYclovir (VALTREX) 1000 MG tablet Take 1,000 mg by mouth 2 (two) times daily.        Psychiatric Specialty Exam:     Blood pressure 122/67, pulse 67, temperature 98.1 F (36.7  C), temperature source Oral, resp. rate 18, last menstrual period 07/11/2013, SpO2 100.00%.There is no weight on file to calculate BMI.  General Appearance: Casual  Eye Contact::  Fair  Speech:  Normal Rate  Volume:  Normal  Mood:  Depressed  Affect:  Non-Congruent  Thought Process:  Coherent  Orientation:  Full (Time, Place, and Person)  Thought Content:  Rumination  Suicidal Thoughts:  Yes.  with intent/plan  Homicidal Thoughts:  No  Memory:  Immediate;   Fair Recent;   Fair Remote;   Fair  Judgement:  Fair  Insight:  Fair  Psychomotor Activity:  EPS  Concentration:  Fair  Recall:  [employer] of Knowledge:Good  Language: Good  Akathisia:  No  Handed:  Right  AIMS (if indicated):     Assets:  Communication Skills Desire for Improvement Financial Resources/Insurance Housing Intimacy Leisure Time [place] Talents/Skills Transportation Vocational/Educational  Sleep:      Musculoskeletal: Strength & Muscle Tone: within normal limits Gait & Station: normal Patient leans: N/A  Treatment Plan Summary: Daily contact with patient to assess and evaluate symptoms and progress in treatment Medication management; Serzone 100 mg BID, Lamictal 75 mg daily, and Abilify 2 mg at bedtime.  Monitor patient overnight and re-evaluate in the am.  Waylan Boga, PMH-NP 07/11/2013 5:06 PM  Pt seen and I agree with assessment and plan Levonne Spiller MD

## 2013-07-12 ENCOUNTER — Encounter (HOSPITAL_COMMUNITY): Payer: Self-pay | Admitting: Registered Nurse

## 2013-07-12 DIAGNOSIS — F1020 Alcohol dependence, uncomplicated: Secondary | ICD-10-CM

## 2013-07-12 DIAGNOSIS — F1120 Opioid dependence, uncomplicated: Secondary | ICD-10-CM

## 2013-07-12 DIAGNOSIS — F3130 Bipolar disorder, current episode depressed, mild or moderate severity, unspecified: Secondary | ICD-10-CM

## 2013-07-12 MED ORDER — NEFAZODONE HCL 100 MG PO TABS: 100.0000 mg | ORAL_TABLET | Freq: Two times a day (BID) | ORAL | Status: DC

## 2013-07-12 MED ORDER — ARIPIPRAZOLE 2 MG PO TABS: 2.0000 mg | ORAL_TABLET | Freq: Every day | ORAL | Status: DC

## 2013-07-12 NOTE — ED Notes (Signed)
D/C instructions reviewed with understanding stated. Given prescription and reviewed instructions. Also provided note from doctor for her job. No belongings here other than eyeglasses. Denies pain. No complaints voiced. Waiting on husband to take her home.

## 2013-07-12 NOTE — [facility] Suicide Risk Assessment (Signed)
Suicide Risk Assessment  Discharge Assessment     Demographic Factors:  Female  Total Time spent with patient: 15 minutes  Psychiatric Specialty Exam:     Blood pressure 108/72, pulse 70, temperature 97.9 F (36.6 C), temperature source Oral, resp. rate 16, last menstrual period 07/11/2013, SpO2 97.00%.There is no weight on file to calculate BMI.  General Appearance: Casual  Eye Contact::  Good  Speech:  Clear and Coherent and Normal Rate  Volume:  Normal  Mood:  "I'm good"  Affect:  Congruent  Thought Process:  Circumstantial, Coherent and Goal Directed  Orientation:  Full (Time, Place, and Person)  Thought Content:  Rumination  Suicidal Thoughts:  No  Homicidal Thoughts:  No  Memory:  Immediate;   Good Recent;   Good Remote;   Good  Judgement:  Intact  Insight:  Present  Psychomotor Activity:  Normal  Concentration:  Good  Recall:  Good  Fund of Knowledge:Good  Language: Good  Akathisia:  No  Handed:  Right  AIMS (if indicated):     Assets:  Communication Skills Desire for Improvement Housing Social Support Transportation  Sleep:       Musculoskeletal: Strength & Muscle Tone: within normal limits Gait & Station: normal Patient leans: N/A   Mental Status Per Nursing Assessment::   On Admission:     Current Mental Status by Physician: Patient denies suicidal/homicidal ideation, psychosis, and paranoia  Loss Factors: NA  Historical Factors: NA  Risk Reduction Factors:   Sense of responsibility to family, Positive social support and Positive therapeutic relationship  Continued Clinical Symptoms:  Previous Psychiatric Diagnoses and Treatments  Cognitive Features That Contribute To Risk:  None noted    Suicide Risk:  Minimal: No identifiable suicidal ideation.  Patients presenting with no risk factors but with morbid ruminations; may be classified as minimal risk based on the severity of the depressive symptoms  Discharge Diagnoses:   AXIS I:   Bipolar, Depressed AXIS II:  Deferred AXIS III:   Past Medical History  Diagnosis Date  . Anemia   . IBS (irritable bowel syndrome)   . GERD (gastroesophageal reflux disease)   . Anxiety   . Bipolar 1 disorder   . Borderline personality disorder   . Hypothyroidism 06/08/2010  . Alcoholism   . Depression    AXIS IV:  other psychosocial or environmental problems AXIS V:  61-70 mild symptoms  Plan Of Care/Follow-up recommendations:  Activity:  Resume usual activity Diet:  Resume usual diet  Is patient on multiple antipsychotic therapies at discharge:  No   Has Patient had three or more failed trials of antipsychotic monotherapy by history:  No  Recommended Plan for Multiple Antipsychotic Therapies: NA    Shuvon Rankin, FNP-BC 07/12/2013, 10:48 AM  Patient seen and I agree with assessment and plan. Levonne Spiller MD

## 2013-07-12 NOTE — Consult Note (Signed)
[facility] Consult   Reason for Consult:  Depression with suicidal ideation Referring Physician:  EDP  Diana Decker is an 48 y.o. female. Total Time spent with patient: 20 minutes  Assessment: AXIS I:  Bipolar, Depressed; Alcohol and opiate dependency (clean for 13 years) AXIS II:  Deferred AXIS III:   Past Medical History  Diagnosis Date  . Anemia   . IBS (irritable bowel syndrome)   . GERD (gastroesophageal reflux disease)   . Anxiety   . Bipolar 1 disorder   . Borderline personality disorder   . Hypothyroidism 06/08/2010  . Alcoholism   . Depression    AXIS IV:  occupational problems AXIS V:  41-50 serious symptoms  Plan:  Supportive therapy provided about ongoing stressors.  Adjust medications and reassess in am.  Subjective:   Diana Decker is a 48 y.o. female patient will be monitored over night after medication adjustments.  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  When the medication changes happen; I just got out of sync and was missing my night time doses. Since I've had a chance to sleep and get some rest; I feel so much better." Patient denies suicidal/homicidal ideation, psychosis, and paranoia.  Patient states that she doesn't feel that she needs to be in the hospital; just a couple more days off from work.  Patient states that she lives with her husband who is supportive.    HPI Elements:   Location:  generalized. Quality:  acute. Severity:  moderate to severe. Timing:  constant. Duration:  today. Context:  work stressors.  Past Psychiatric History: Past Medical History  Diagnosis Date  . Anemia   . IBS (irritable bowel  syndrome)   . GERD (gastroesophageal reflux disease)   . Anxiety   . Bipolar 1 disorder   . Borderline personality disorder   . Hypothyroidism 06/08/2010  . Alcoholism   . Depression     reports that she has never smoked. She has never used smokeless tobacco. She reports that she does not drink alcohol or use illicit drugs. Family History  Problem Relation Age of Onset  . Breast cancer Mother   . Prostate cancer Father   . Heart disease Father   . Colon cancer Neg Hx            Allergies:  No Known Allergies  ACT Assessment Complete:  Yes:    Educational Status    Risk to Self: Risk to self Is patient at risk for suicide?: Yes Substance abuse history and/or treatment for substance abuse?: No  Risk to Others:    Abuse:    Prior Inpatient Therapy:    Prior Outpatient Therapy:    Additional Information:     Objective: Blood pressure 108/72, pulse 70, temperature 97.9 F (36.6 C), temperature source Oral, resp. rate 16, last menstrual period 07/11/2013, SpO2 97.00%.There is no weight on file to calculate BMI. Results for orders placed during the hospital encounter of 07/11/13 (from the past 72 hour(s))  CBC     Status: Abnormal   Collection Time    07/11/13  3:11 PM      Result Value  Ref Range   WBC 5.0  4.0 - 10.5 K/uL   RBC 3.73 (*) 3.87 - 5.11 MIL/uL   Hemoglobin 11.5 (*) 12.0 - 15.0 g/dL   HCT 34.9 (*) 36.0 - 46.0 %   MCV 93.6  78.0 - 100.0 fL   MCH 30.8  26.0 - 34.0 pg   MCHC 33.0  30.0 - 36.0 g/dL   RDW 13.6  11.5 - 15.5 %   Platelets 279  150 - 400 K/uL  COMPREHENSIVE METABOLIC PANEL     Status: None   Collection Time    07/11/13  3:11 PM      Result Value Ref Range   Sodium 141  137 - 147 mEq/L   Potassium 4.0  3.7 - 5.3 mEq/L   Chloride 102  96 - 112 mEq/L   CO2 27  19 - 32 mEq/L   Glucose, Bld 90  70 - 99 mg/dL   BUN 11  6 - 23 mg/dL   Creatinine, Ser 0.71  0.50 - 1.10 mg/dL   Calcium 9.3  8.4 - 10.5 mg/dL   Total Protein 6.9  6.0 - 8.3 g/dL    Albumin 3.7  3.5 - 5.2 g/dL   AST 14  0 - 37 U/L   ALT 12  0 - 35 U/L   Alkaline Phosphatase 62  39 - 117 U/L   Total Bilirubin 0.3  0.3 - 1.2 mg/dL   GFR calc non Af Amer >90  >90 mL/min   GFR calc Af Amer >90  >90 mL/min   Comment: (NOTE)     The eGFR has been calculated using the CKD EPI equation.     This calculation has not been validated in all clinical situations.     eGFR's persistently <90 mL/min signify possible Chronic Kidney     Disease.  ETHANOL     Status: None   Collection Time    07/11/13  3:11 PM      Result Value Ref Range   Alcohol, Ethyl (B) <11  0 - 11 mg/dL   Comment:            LOWEST DETECTABLE LIMIT FOR     SERUM ALCOHOL IS 11 mg/dL     FOR MEDICAL PURPOSES ONLY  URINE RAPID DRUG SCREEN (HOSP PERFORMED)     Status: None   Collection Time    07/11/13  3:29 PM      Result Value Ref Range   Opiates NONE DETECTED  NONE DETECTED   Cocaine NONE DETECTED  NONE DETECTED   Benzodiazepines NONE DETECTED  NONE DETECTED   Amphetamines NONE DETECTED  NONE DETECTED   Tetrahydrocannabinol NONE DETECTED  NONE DETECTED   Barbiturates NONE DETECTED  NONE DETECTED   Comment:            DRUG SCREEN FOR MEDICAL PURPOSES     ONLY.  IF CONFIRMATION IS NEEDED     FOR ANY PURPOSE, NOTIFY LAB     WITHIN 5 DAYS.                LOWEST DETECTABLE LIMITS     FOR URINE DRUG SCREEN     Drug Class       Cutoff (ng/mL)     Amphetamine      1000     Barbiturate      200     Benzodiazepine   413     Tricyclics       244  Opiates          300     Cocaine          300     THC              50   Labs are reviewed and are pertinent for slight anemia.  No medication changes  Current Facility-Administered Medications  Medication Dose Route Frequency Provider Last Rate Last Dose  . acetaminophen (TYLENOL) tablet 650 mg  650 mg Oral Q4H PRN [name], PA-C      . alum & mag hydroxide-simeth (MAALOX/MYLANTA) 200-200-20 MG/5ML suspension 30 mL  30 mL Oral PRN [name], PA-C       . ARIPiprazole (ABILIFY) tablet 2 mg  2 mg Oral Daily Waylan Boga, NP   2 mg at 07/11/13 2014  . ibuprofen (ADVIL,MOTRIN) tablet 600 mg  600 mg Oral Q8H PRN [name], PA-C   600 mg at 07/11/13 1910  . lamoTRIgine (LAMICTAL) tablet 75 mg  75 mg Oral Daily Waylan Boga, NP   75 mg at 07/12/13 1016  . levothyroxine (SYNTHROID, LEVOTHROID) tablet 50 mcg  50 mcg Oral QAC breakfast [name], PA-C   50 mcg at 07/12/13 2458  . nefazodone (SERZONE) tablet 100 mg  100 mg Oral BID Waylan Boga, NP   100 mg at 07/12/13 0742  . ondansetron (ZOFRAN) tablet 4 mg  4 mg Oral Q8H PRN [name], PA-C      . zolpidem (AMBIEN) tablet 5 mg  5 mg Oral QHS PRN [name], PA-C   5 mg at 07/11/13 2132   Current Outpatient Prescriptions  Medication Sig Dispense Refill  . asenapine (SAPHRIS) 5 MG SUBL 24 hr tablet Place 5 mg under the tongue at bedtime.      . lamoTRIgine (LAMICTAL) 25 MG tablet Take 75 mg by mouth daily.      Marland Kitchen levothyroxine (SYNTHROID, LEVOTHROID) 50 MCG tablet Take 50 mcg by mouth daily before breakfast.      . nefazodone (SERZONE) 200 MG tablet Take 400 mg by mouth daily.      . Probiotic Product (PHILLIPS COLON HEALTH PO) Take 1 tablet by mouth daily at 2 PM daily at 2 PM.      . valACYclovir (VALTREX) 1000 MG tablet Take 1,000 mg by mouth 2 (two) times daily.        Psychiatric Specialty Exam:     Blood pressure 108/72, pulse 70, temperature 97.9 F (36.6 C), temperature source Oral, resp. rate 16, last menstrual period 07/11/2013, SpO2 97.00%.There is no weight on file to calculate BMI.  General Appearance: Casual  Eye Contact::  Good  Speech:  Normal Rate  Volume:  Normal  Mood:  Depressed  Affect:  Congruent  Thought Process:  Circumstantial, Coherent and Goal Directed  Orientation:  Full (Time, Place, and Person)  Thought Content:  Rumination  Suicidal Thoughts:  No  Homicidal Thoughts:  No  Memory:  Immediate;   Good Recent;   Good Remote;   Good   Judgement:  Intact  Insight:  Present  Psychomotor Activity:  Normal  Concentration:  Good  Recall:  Good  Fund of Knowledge:Good  Language: Good  Akathisia:  No  Handed:  Right  AIMS (if indicated):     Assets:  Communication Skills Desire for Improvement Financial Resources/Insurance Housing Intimacy Leisure Time [place] Talents/Skills Transportation Vocational/Educational  Sleep:      Musculoskeletal: Strength & Muscle Tone: within normal limits  Gait & Station: normal Patient leans: N/A  Treatment Plan Summary: Out of work for 2 days for rest.  Follow up with primary psych provider Continue Serzone 100 mg BID, Lamictal 75 mg daily, and Abilify 2 mg at bedtime.    Disposition:  Discharge home.  Note for work for patient to be out for 2 days returning on Wednesday 07/15/13.  Patient to follow up with primary psych provider.   Shuvon Rankin, FNP-BC 07/12/2013 10:20 AM  Patient seen and I agree with assessment and plan Levonne Spiller MD

## 2013-07-12 NOTE — ED Provider Notes (Signed)
Medical screening examination/treatment/procedure(s) were performed by non-physician practitioner and as supervising physician I was immediately available for consultation/collaboration.   EKG Interpretation None        Hoy Morn, MD 07/12/13 [phone]

## 2013-07-12 NOTE — ED Notes (Signed)
Husband here to transport home. Ambulatory without difficulty. Escorted by mental health tech to front of hospital.

## 2013-07-12 NOTE — ED Notes (Signed)
Pt denies SI/HI/AVH.  Pt affect and mood are appropriate.  Pt questioning when she will be able to leave.  Advised her that MD will reassess her this morning and decide upon treatment plan.  Pt states she slept well last night and feels much better.  Pt smiling and engaging with staff appropriately.

## 2013-07-12 NOTE — Discharge Instructions (Signed)
Bipolar Disorder °Bipolar disorder is a mental illness. The term bipolar disorder actually is used to describe a group of disorders that all share varying degrees of emotional highs and lows that can interfere with daily functioning, such as work, school, or relationships. Bipolar disorder also can lead to drug abuse, hospitalization, and suicide. °The emotional highs of bipolar disorder are periods of elation or irritability and high energy. These highs can range from a mild form (hypomania) to a severe form (mania). People experiencing episodes of hypomania may appear energetic, excitable, and highly productive. People experiencing mania may behave impulsively or erratically. They often make poor decisions. They may have difficulty sleeping. The most severe episodes of mania can involve having very distorted beliefs or perceptions about the world and seeing or hearing things that are not real (psychotic delusions and hallucinations).  °The emotional lows of bipolar disorder (depression) also can range from mild to severe. Severe episodes of bipolar depression can involve psychotic delusions and hallucinations. °Sometimes people with bipolar disorder experience a state of mixed mood. Symptoms of hypomania or mania and depression are both present during this mixed-mood episode. °SIGNS AND SYMPTOMS °There are signs and symptoms of the episodes of hypomania and mania as well as the episodes of depression. The signs and symptoms of hypomania and mania are similar but vary in severity. They include: °· Inflated self-esteem or feeling of increased self-confidence. °· Decreased need for sleep. °· Unusual talkativeness (rapid or pressured speech) or the feeling of a need to keep talking. °· Sensation of racing thoughts or constant talking, with quick shifts between topics that may or may not be related (flight of ideas). °· Decreased ability to focus or concentrate. °· Increased purposeful activity, such as work, studies,  or social activity, or nonproductive activity, such as pacing, squirming and fidgeting, or finger and toe tapping. °· Impulsive behavior and use of poor judgment, resulting in high-risk activities, such as having unprotected sex or spending excessive amounts of money. °Signs and symptoms of depression include the following:  °· Feelings of sadness, hopelessness, or helplessness. °· Frequent or uncontrollable episodes of crying. °· Lack of feeling anything or caring about anything. °· Difficulty sleeping or sleeping too much.  °· Inability to enjoy the things you used to enjoy.   °· Desire to be alone all the time.   °· Feelings of guilt or worthlessness.  °· Lack of energy or motivation.   °· Difficulty concentrating, remembering, or making decisions.  °· Change in appetite or weight beyond normal fluctuations. °· Thoughts of death or the desire to harm yourself. °DIAGNOSIS  °Bipolar disorder is diagnosed through an assessment by your caregiver. Your caregiver will ask questions about your emotional episodes. There are two main types of bipolar disorder. People with type I bipolar disorder have manic episodes with or without depressive episodes. People with type II bipolar disorder have hypomanic episodes and major depressive episodes, which are more serious than mild depression. The type of bipolar disorder you have can make an important difference in how your illness is monitored and treated. °Your caregiver may ask questions about your medical history and use of alcohol or drugs, including prescription medication. Certain medical conditions and substances also can cause emotional highs and lows that resemble bipolar disorder (secondary bipolar disorder).  °TREATMENT  °Bipolar disorder is a long-term illness. It is best controlled with continuous treatment rather than treatment only when symptoms occur. The following treatments can be prescribed for bipolar disorders: °· Medication Medication can be prescribed by  a doctor   that is an expert in treating mental disorders (psychiatrists). Medications called mood stabilizers are usually prescribed to help control the illness. Other medications are sometimes added if symptoms of mania, depression, or psychotic delusions and hallucinations occur despite the use of a mood stabilizer. °· Talk therapy Some forms of talk therapy are helpful in providing support, education, and guidance. °A combination of medication and talk therapy is best for managing the disorder over time. A procedure in which electricity is applied to your brain through your scalp (electroconvulsive therapy) is used in cases of severe mania when medication and talk therapy do not work or work too slowly. °Document Released: 05/07/2000 Document Revised: 05/26/2012 Document Reviewed: 02/25/2012 °[employer]® Patient Information ©2014 [employer]. ° °

## 2013-07-27 ENCOUNTER — Other Ambulatory Visit: Payer: Self-pay | Admitting: Family Medicine

## 2013-09-04 ENCOUNTER — Ambulatory Visit: Payer: Self-pay | Admitting: Family Medicine

## 2013-09-10 ENCOUNTER — Ambulatory Visit (INDEPENDENT_AMBULATORY_CARE_PROVIDER_SITE_OTHER): Payer: BC Managed Care – PPO | Admitting: Family Medicine

## 2013-09-10 ENCOUNTER — Encounter: Payer: Self-pay | Admitting: Family Medicine

## 2013-09-10 VITALS — BP 108/74 | HR 74 | Temp 98.2°F | Ht 67.5 in | Wt 195.0 lb

## 2013-09-10 DIAGNOSIS — E039 Hypothyroidism, unspecified: Secondary | ICD-10-CM

## 2013-09-10 DIAGNOSIS — Z131 Encounter for screening for diabetes mellitus: Secondary | ICD-10-CM

## 2013-09-10 DIAGNOSIS — Z13220 Encounter for screening for lipoid disorders: Secondary | ICD-10-CM

## 2013-09-10 DIAGNOSIS — Z79899 Other long term (current) drug therapy: Secondary | ICD-10-CM

## 2013-09-10 LAB — LIPID PANEL
Cholesterol: 222 mg/dL — ABNORMAL HIGH (ref 0–200)
HDL: 65.3 mg/dL (ref >39.00)
LDL Cholesterol: 145 mg/dL — ABNORMAL HIGH (ref 0–99)
NonHDL: 156.7
Total CHOL/HDL Ratio: 3
Triglycerides: 59 mg/dL (ref 0.0–149.0)
VLDL: 11.8 mg/dL (ref 0.0–40.0)

## 2013-09-10 LAB — CBC WITH DIFFERENTIAL/PLATELET
Basophils Absolute: 0 10*3/uL (ref 0.0–0.1)
Basophils Relative: 0.4 % (ref 0.0–3.0)
Eosinophils Absolute: 0.2 10*3/uL (ref 0.0–0.7)
Eosinophils Relative: 4.7 % (ref 0.0–5.0)
HCT: 38.6 % (ref 36.0–46.0)
Hemoglobin: 12.9 g/dL (ref 12.0–15.0)
Lymphocytes Relative: 38.9 % (ref 12.0–46.0)
Lymphs Abs: 1.8 10*3/uL (ref 0.7–4.0)
MCHC: 33.5 g/dL (ref 30.0–36.0)
MCV: 92.9 fl (ref 78.0–100.0)
Monocytes Absolute: 0.5 10*3/uL (ref 0.1–1.0)
Monocytes Relative: 9.8 % (ref 3.0–12.0)
Neutro Abs: 2.2 10*3/uL (ref 1.4–7.7)
Neutrophils Relative %: 46.2 % (ref 43.0–77.0)
Platelets: 252 10*3/uL (ref 150.0–400.0)
RBC: 4.16 Mil/uL (ref 3.87–5.11)
RDW: 13.8 % (ref 11.5–15.5)
WBC: 4.7 10*3/uL (ref 4.0–10.5)

## 2013-09-10 LAB — T3, FREE: T3, Free: 2.6 pg/mL (ref 2.3–4.2)

## 2013-09-10 LAB — BASIC METABOLIC PANEL
BUN: 16 mg/dL (ref 6–23)
CO2: 28 mEq/L (ref 19–32)
Calcium: 9.5 mg/dL (ref 8.4–10.5)
Chloride: 107 mEq/L (ref 96–112)
Creatinine, Ser: 0.8 mg/dL (ref 0.4–1.2)
GFR: 83.93 mL/min (ref >60.00)
Glucose, Bld: 101 mg/dL — ABNORMAL HIGH (ref 70–99)
Potassium: 4.5 mEq/L (ref 3.5–5.1)
Sodium: 141 mEq/L (ref 135–145)

## 2013-09-10 LAB — T4, FREE: Free T4: 0.86 ng/dL (ref 0.60–1.60)

## 2013-09-10 LAB — TSH: TSH: 3.17 u[IU]/mL (ref 0.35–4.50)

## 2013-09-10 MED ORDER — LEVOTHYROXINE SODIUM 50 MCG PO TABS
50.0000 ug | ORAL_TABLET | Freq: Every day | ORAL | Status: DC
Start: 2013-09-10 — End: 2014-11-01

## 2013-09-10 NOTE — Progress Notes (Signed)
Pre visit review using our clinic review tool, if applicable. No additional management support is needed unless otherwise documented below in the visit note. 

## 2013-09-10 NOTE — Addendum Note (Signed)
Addended by: Owens Loffler on: 09/10/2013 02:49 PM   Modules accepted: Orders

## 2013-09-10 NOTE — Progress Notes (Signed)
[place] [address] Phone: [phone] Fax: [phone]  Patient ID: Diana Decker MRN: [id number], DOB: 1965/04/26, 48 y.o. Date of Encounter: 09/10/2013  Primary Physician:  Eliezer Lofts, MD   Chief Complaint: Medication Refill   Subjective:   History of Present Illness:  Diana Decker is a 48 y.o. very pleasant female patient who presents with the following:  Check thyroid levels.   Thyroid: No symptoms. Labs reviewed. Denies cold / heat intolerance, dry skin, hair loss. No goiter.  Lab Results  Component Value Date   TSH 2.79 10/07/2012     Now is having some night sweats. Having some night sweats.  ? Premenopausal.  90? Days.   Dr. Toy Care checks her LFT's, not other labs.   Past Medical History, Surgical History, Social History, Family History, Problem List, Medications, and Allergies have been reviewed and updated if relevant.  Review of Systems:  GEN: No acute illnesses, no fevers, chills. GI: No n/v/d, eating normally Pulm: No SOB Interactive and getting along well at home.  Otherwise, ROS is as per the HPI.  Objective:   Physical Examination: BP 108/74  Pulse 74  Temp(Src) 98.2 F (36.8 C) (Oral)  Ht 5' 7.5" (1.715 m)  Wt 195 lb (88.451 kg)  BMI 30.07 kg/m2  LMP 08/27/2013   GEN: WDWN, NAD, Non-toxic, A & O x 3 HEENT: Atraumatic, Normocephalic. Neck supple. No masses, No LAD. Ears and Nose: No external deformity. CV: RRR, No M/G/R. No JVD. No thrill. No extra heart sounds. PULM: CTA B, no wheezes, crackles, rhonchi. No retractions. No resp. distress. No accessory muscle use. EXTR: No c/c/e NEURO Normal gait.  PSYCH: Normally interactive. Conversant. Not depressed or anxious appearing.  Calm demeanor.   Laboratory and Imaging Data:  Assessment & Plan:   Unspecified hypothyroidism - Plan: T3, free, T4, free, TSH  Screening for diabetes mellitus - Plan: Basic metabolic panel  Screening for lipoid disorders - Plan:  Lipid panel  Encounter for long-term (current) use of other medications - Plan: CBC with Differential, Basic metabolic panel, Lipid panel  Doing pretty well overall.   Check thyroid labs.   Psych hosp x 1 day in may.  Orders Placed This Encounter  Procedures  . T3, free  . T4, free  . TSH  . CBC with Differential  . Basic metabolic panel  . Lipid panel   Follow-up: No Follow-up on file. Unless noted above, the patient is to follow-up if symptoms worsen. Red flags were reviewed with the patient.  Signed,  Maud Deed. Tlaloc Taddei, MD, CA[facility] Sports Medicine   Discontinued Medications   LEVOTHYROXINE (SYNTHROID, LEVOTHROID) 50 MCG TABLET    TAKE 1 TABLET BY MOUTH EVERY DAY   Current Medications at Discharge:   Medication List       This list is accurate as of: 09/10/13  8:24 AM.  Always use your most recent med list.               ARIPiprazole 2 MG tablet  Commonly known as:  ABILIFY  Take 1 tablet (2 mg total) by mouth daily.     lamoTRIgine 25 MG tablet  Commonly known as:  LAMICTAL  Take 75 mg by mouth daily.     LATUDA 60 MG Tabs  Generic drug:  Lurasidone HCl  Take by mouth daily.     levothyroxine 50 MCG tablet  Commonly known as:  SYNTHROID, LEVOTHROID  Take 50 mcg by mouth daily before breakfast.  nefazodone 200 MG tablet  Commonly known as:  SERZONE  Take 400 mg by mouth daily.     PHILLIPS COLON HEALTH PO  Take 1 tablet by mouth daily at 2 PM daily at 2 PM.     valACYclovir 1000 MG tablet  Commonly known as:  VALTREX  Take 1,000 mg by mouth 2 (two) times daily.

## 2013-09-15 ENCOUNTER — Telehealth: Payer: Self-pay | Admitting: Family Medicine

## 2013-09-15 NOTE — Telephone Encounter (Signed)
Pt wants to talk about recent lab results.  She said she was told her that her blood sugar was high and her cholesterol was high but she wants to know how high. Please call pt [phone]

## 2013-09-16 NOTE — Telephone Encounter (Signed)
Left message for Diana Decker that I have mailed her a copy of her recent lab results but if she would like to discuss these levels to please return my call.

## 2014-09-22 ENCOUNTER — Telehealth: Payer: Self-pay | Admitting: Obstetrics and Gynecology

## 2014-09-22 ENCOUNTER — Encounter: Payer: Self-pay | Admitting: Obstetrics and Gynecology

## 2014-09-22 NOTE — Telephone Encounter (Signed)
Patient cancelled appointment for today. See separate staff message.

## 2014-11-01 ENCOUNTER — Other Ambulatory Visit: Payer: Self-pay | Admitting: Family Medicine

## 2014-11-01 NOTE — Telephone Encounter (Signed)
Please call and schedule CPE, or at least a follow up on thyroid, with fasting labs prior with Dr. Diona Browner.

## 2014-11-22 ENCOUNTER — Other Ambulatory Visit: Payer: Self-pay

## 2014-11-23 LAB — HM PAP SMEAR: HM Pap smear: NORMAL

## 2014-11-23 LAB — CYTOLOGY - PAP

## 2015-01-24 ENCOUNTER — Encounter: Payer: Self-pay | Admitting: Gastroenterology

## 2015-01-26 ENCOUNTER — Telehealth: Payer: Self-pay | Admitting: *Deleted

## 2015-01-26 NOTE — Telephone Encounter (Signed)
Last office visit 09/10/2013.  No future appointments scheduled.  Refill?

## 2015-01-27 ENCOUNTER — Other Ambulatory Visit: Payer: Self-pay | Admitting: Family Medicine

## 2015-01-27 NOTE — Telephone Encounter (Signed)
Needs appt with labs prior.Marland Kitchen reifll until appt date.

## 2015-01-27 NOTE — Telephone Encounter (Signed)
Please call and schedule CPE with fasting labs prior for Dr. Diona Browner.  Needs to schedule appointment before she will refill her medicine.  Please send back to me once appointment is scheduled so I can refill her medication to get her to her appointment.

## 2015-01-28 NOTE — Telephone Encounter (Signed)
Left message on both numbers asking pt to call office  °

## 2015-01-28 NOTE — Telephone Encounter (Signed)
Spoke with pt   Pt stated she lives in [place] and works in [place] She wanted to transfer care  To either [place] or [place] office. i gave her phone numbers to both office

## 2015-02-03 ENCOUNTER — Telehealth: Payer: Self-pay | Admitting: Family Medicine

## 2015-02-03 NOTE — Telephone Encounter (Signed)
error:[id number] ° °

## 2015-02-04 ENCOUNTER — Encounter: Payer: Self-pay | Admitting: Medical

## 2015-02-04 ENCOUNTER — Ambulatory Visit: Payer: Self-pay | Admitting: Family

## 2015-02-04 ENCOUNTER — Ambulatory Visit (INDEPENDENT_AMBULATORY_CARE_PROVIDER_SITE_OTHER): Payer: BLUE CROSS/BLUE SHIELD | Admitting: Medical

## 2015-02-04 VITALS — BP 135/77 | HR 73 | Temp 98.3°F | Ht 67.5 in | Wt 208.6 lb

## 2015-02-04 DIAGNOSIS — F3160 Bipolar disorder, current episode mixed, unspecified: Secondary | ICD-10-CM

## 2015-02-04 DIAGNOSIS — K219 Gastro-esophageal reflux disease without esophagitis: Secondary | ICD-10-CM

## 2015-02-04 DIAGNOSIS — Z23 Encounter for immunization: Secondary | ICD-10-CM

## 2015-02-04 DIAGNOSIS — R1084 Generalized abdominal pain: Secondary | ICD-10-CM

## 2015-02-04 DIAGNOSIS — D649 Anemia, unspecified: Secondary | ICD-10-CM | POA: Diagnosis not present

## 2015-02-04 DIAGNOSIS — E039 Hypothyroidism, unspecified: Secondary | ICD-10-CM

## 2015-02-04 MED ORDER — RANITIDINE HCL 150 MG PO CAPS: 150.0000 mg | ORAL_CAPSULE | Freq: Two times a day (BID) | ORAL | Status: DC

## 2015-02-04 NOTE — Progress Notes (Signed)
Subjective:    Patient ID: Diana Decker, female    DOB: 03-02-1965, 49 y.o.   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  Pt was going to Systems analyst at [employer].  Thyroid- pt can't remember how long hypothyroid. She thinks maybe few years. 15 months since last level tested. Pt is fatigued and she feels like gaining weight. Pt states has been trying to loose weight but has gained weight. Last couple of weeks eating poorly. Pt has been out of thyroid med for 2 wks. Old practice would not fill since no level.  Anemia- pt last labs September 10, 2013 showed not anemic. But cbc prior one showed mild anemia  Bipolar disorder- see psychiarist. She is seeing new one in January. Pt does feel little depressed. Pt stressed out at work.  Jerrye Bushy- pt states not controlled now. With weight gain symptoms are worse. Pt takes tums presently. With recent weight gain  Reflux. occuring couple of times a week.   Pt states feels like bloats and cramps after eats. Pt states mom gluten intolerance.  LMP- has recent perimenopausal pattern over last year. FSH was Pap smear- normal and done 2 months ago. Pt has mammogram scheduled next month   Review of Systems  Constitutional: Positive for fatigue and unexpected weight change. Negative for fever and chills.       But hypothyoid and poor diet.  HENT: Negative for congestion, ear pain, postnasal drip, rhinorrhea, sinus pressure and sneezing.   Respiratory: Negative for cough, chest tightness, shortness of breath and wheezing.   Cardiovascular: Negative for chest pain and palpitations.  Gastrointestinal: Positive for abdominal pain. Negative for nausea and vomiting.       See hpi  Endocrine: Negative for polydipsia, polyphagia and polyuria.  Musculoskeletal: Negative for  back pain.  Hematological: Negative for adenopathy. Does not bruise/bleed easily.  Psychiatric/Behavioral: Positive for dysphoric mood. Negative for suicidal ideas, behavioral problems, confusion and self-injury. The patient is nervous/anxious.        Mild stress and mild depressed. Overall stable.    Past Medical History  Diagnosis Date  . Anemia   . IBS (irritable bowel syndrome)   . GERD (gastroesophageal reflux disease)   . Anxiety   . Bipolar 1 disorder ([place])   . Borderline personality disorder   . Hypothyroidism 06/08/2010  . Alcoholism ([place])   . Depression     Social History   Social History  . Marital Status: Married    Spouse Name: N/A  . Number of Children: N/A  . Years of Education: N/A   Occupational History  . Hospital doctor    Social History Main Topics  . Smoking status: Never Smoker   . Smokeless tobacco: Never Used  . Alcohol Use: No  . Drug Use: No  . Sexual Activity: Yes    Birth Control/ Protection: Surgical     Comment: husband with vasectomy   Other Topics Concern  . 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 Appendectomy    . Hernia repair      inguinal    Family History  Problem Relation Age of Onset  .  Breast cancer Mother   . Prostate cancer Father   . Heart disease Father   . Colon cancer Neg Hx     No Known Allergies  Current Outpatient Prescriptions on File Prior to Visit  Medication Sig Dispense Refill  . lamoTRIgine (LAMICTAL) 25 MG tablet Take 75 mg by mouth daily.    Marland Kitchen levothyroxine (SYNTHROID, LEVOTHROID) 50 MCG tablet TAKE 1 TABLET (50 MCG TOTAL) BY MOUTH DAILY BEFORE BREAKFAST. 90 tablet 0  . nefazodone (SERZONE) 200 MG tablet Take 400 mg by mouth daily.    . valACYclovir (VALTREX) 1000 MG tablet Take 1,000 mg by mouth 2 (two) times daily.     No current facility-administered  medications on file prior to visit.    BP 135/77 mmHg  Pulse 73  Temp(Src) 98.3 F (36.8 C) (Oral)  Ht 5' 7.5" (1.715 m)  Wt 208 lb 9.6 oz (94.62 kg)  BMI 32.17 kg/m2  SpO2 98%  LMP 01/13/2015       Objective:   Physical Exam  General Appearance- Not in acute distress.  HEENT Eyes- Scleraeral/Conjuntiva-bilat- Not Yellow. Mouth & Throat- Normal.  Chest and Lung Exam Auscultation: Breath sounds:-Normal. Adventitious sounds:- No Adventitious sounds.  Cardiovascular Auscultation:Rythm - Regular. Heart Sounds -Normal heart sounds.  Abdomen Inspection:-Inspection Normal.  Palpation/Perucssion: Palpation and Percussion of the abdomen reveal- Non Tender, No Rebound tenderness, No rigidity(Guarding) and No Palpable abdominal masses.  Liver:-Normal.  Spleen:- Normal.        Assessment & Plan:  For abdomen pain and other Gi symptoms will get labs listed on attached sheet.   For anemia hx  labs wll include cbc.  For gerd history better diet and recommend ranitidine.  For bipolar continue to see psychiatrist and continue current meds.  Will get tsh today and plan to restart you on dose and then titrate up.  Follow up in 1 month or as needed. Could schedule cpe if you want. Come in fasting.

## 2015-02-04 NOTE — Progress Notes (Signed)
Pre visit review using our clinic review tool, if applicable. No additional management support is needed unless otherwise documented below in the visit note. 

## 2015-02-04 NOTE — Patient Instructions (Signed)
For abdomen pain and other Gi symptoms will get labs listed on attached sheet.   For anemia hx  labs wll include cbc.  For gerd history better diet and recommend ranitidine.  For bipolar continue to see psychiatrist and continue current meds.  Will get tsh today and plan to restart you on dose and then titrate up.  Follow up in 1 month or as needed. Could schedule cpe if you want. Come in fasting.

## 2015-02-09 ENCOUNTER — Telehealth: Payer: Self-pay | Admitting: Medical

## 2015-02-09 ENCOUNTER — Other Ambulatory Visit: Payer: BLUE CROSS/BLUE SHIELD

## 2015-02-09 LAB — CBC WITH DIFFERENTIAL/PLATELET
Basophils Absolute: 0 10*3/uL (ref 0.0–0.1)
Basophils Relative: 0 % (ref 0–1)
Eosinophils Absolute: 0.3 10*3/uL (ref 0.0–0.7)
Eosinophils Relative: 5 % (ref 0–5)
HCT: 39 % (ref 36.0–46.0)
Hemoglobin: 13.1 g/dL (ref 12.0–15.0)
Lymphocytes Relative: 32 % (ref 12–46)
Lymphs Abs: 1.6 10*3/uL (ref 0.7–4.0)
MCH: 32 pg (ref 26.0–34.0)
MCHC: 33.6 g/dL (ref 30.0–36.0)
MCV: 95.4 fL (ref 78.0–100.0)
MPV: 10.6 fL (ref 8.6–12.4)
Monocytes Absolute: 0.6 10*3/uL (ref 0.1–1.0)
Monocytes Relative: 11 % (ref 3–12)
Neutro Abs: 2.6 10*3/uL (ref 1.7–7.7)
Neutrophils Relative %: 52 % (ref 43–77)
Platelets: 344 10*3/uL (ref 150–400)
RBC: 4.09 MIL/uL (ref 3.87–5.11)
RDW: 13.7 % (ref 11.5–15.5)
WBC: 5 10*3/uL (ref 4.0–10.5)

## 2015-02-09 LAB — COMPREHENSIVE METABOLIC PANEL
ALT: 12 U/L (ref 6–29)
AST: 11 U/L (ref 10–35)
Albumin: 4.2 g/dL (ref 3.6–5.1)
Alkaline Phosphatase: 45 U/L (ref 33–115)
BUN: 12 mg/dL (ref 7–25)
CO2: 25 mmol/L (ref 20–31)
Calcium: 8.8 mg/dL (ref 8.6–10.2)
Chloride: 101 mmol/L (ref 98–110)
Creat: 0.74 mg/dL (ref 0.50–1.10)
Glucose, Bld: 107 mg/dL — ABNORMAL HIGH (ref 65–99)
Potassium: 4.1 mmol/L (ref 3.5–5.3)
Sodium: 138 mmol/L (ref 135–146)
Total Bilirubin: 0.4 mg/dL (ref 0.2–1.2)
Total Protein: 6.9 g/dL (ref 6.1–8.1)

## 2015-02-09 LAB — TSH: TSH: 5.298 u[IU]/mL — ABNORMAL HIGH (ref 0.350–4.500)

## 2015-02-09 LAB — LIPASE: Lipase: 18 U/L (ref 7–60)

## 2015-02-09 LAB — AMYLASE: Amylase: 71 U/L (ref 0–105)

## 2015-02-09 MED ORDER — LEVOTHYROXINE SODIUM 50 MCG PO TABS: ORAL_TABLET | ORAL | Status: DC

## 2015-02-09 NOTE — Telephone Encounter (Signed)
Thyroid med sent in to her pharmacy. Repeat test in about 3 months before she runs out of thyroid med. Next level should be done while on medication.

## 2015-02-09 NOTE — Telephone Encounter (Signed)
Thyroid  Med sent in. Have her schedule appointment about 1 wk before she runs out of medication. Want to check her level next time when actually on the medication.

## 2015-02-10 LAB — GLIADIN ANTIBODIES, SERUM
Gliadin IgA: 7 Units (ref <20)
Gliadin IgG: 3 Units (ref <20)

## 2015-02-10 LAB — H. PYLORI BREATH TEST: H. pylori Breath Test: NOT DETECTED

## 2015-02-10 NOTE — Telephone Encounter (Signed)
Spoke with pt and she voices understanding.  

## 2015-02-13 LAB — HM MAMMOGRAPHY: HM Mammogram: NORMAL (ref 0–4)

## 2015-04-14 ENCOUNTER — Institutional Professional Consult (permissible substitution): Payer: BLUE CROSS/BLUE SHIELD | Admitting: Neurology

## 2015-04-25 ENCOUNTER — Encounter: Payer: Self-pay | Admitting: Neurology

## 2015-04-25 ENCOUNTER — Ambulatory Visit (INDEPENDENT_AMBULATORY_CARE_PROVIDER_SITE_OTHER): Payer: BLUE CROSS/BLUE SHIELD | Admitting: Neurology

## 2015-04-25 VITALS — BP 92/68 | HR 76 | Resp 20 | Ht 67.0 in | Wt 188.0 lb

## 2015-04-25 DIAGNOSIS — R519 Headache, unspecified: Secondary | ICD-10-CM

## 2015-04-25 DIAGNOSIS — G4701 Insomnia due to medical condition: Secondary | ICD-10-CM | POA: Diagnosis not present

## 2015-04-25 DIAGNOSIS — N951 Menopausal and female climacteric states: Secondary | ICD-10-CM

## 2015-04-25 DIAGNOSIS — F5102 Adjustment insomnia: Secondary | ICD-10-CM | POA: Diagnosis not present

## 2015-04-25 DIAGNOSIS — R0683 Snoring: Secondary | ICD-10-CM

## 2015-04-25 DIAGNOSIS — R51 Headache: Secondary | ICD-10-CM

## 2015-04-25 HISTORY — DX: Headache, unspecified: R51.9

## 2015-04-25 HISTORY — DX: Menopausal and female climacteric states: N95.1

## 2015-04-25 HISTORY — DX: Snoring: R06.83

## 2015-04-25 HISTORY — DX: Insomnia due to medical condition: G47.01

## 2015-04-25 NOTE — Progress Notes (Signed)
[facility]   Provider:  Larey Decker, M D  Referring Provider: Jinny Sanders, MD Primary Care Physician:  Diana Lofts, MD  Chief Complaint  Patient presents with  . New Patient (Initial Visit)    snoring, tired during the day, Dr. 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 Caprice Decker for circadian rhythm changes and insomnia.   Chief complaint according to patient : "I cannot stay asleep", and " had the prefect storm of grief, depression/ guilt  and menopausal changes".  Diana Decker reports that she lost a client to an overdose and was saddened and shocked about this  event 3 weeks ago. While already having trouble sleeping she felt now unable to come to terms,  to "shut off my mind". She was afraid to spiral into a bipolar episode. She is a recovering alcoholic and sober for 15 years. She had suicidal thoughts for a month prior, now she couldn't stop crying. Instead of going to work she went to the hospital to be evaluated.  Lamictal was discontinued as was carbamazepine, and her hospital physicians increased trazodone.  She is feeling better now.   Sleep habits are as follows: Diana Decker reports that she begun using her smart phone in bed again -partially to be available for clients but this also had a detrimental effect on her sleep. She begun falling asleep on the couch prior to going to bed. At this time she does not have a routine bedtime, she rises usually at 4:45 AM and is the first one at her office. She would consider herself a workaholic she is very detail oriented and almost compulsive. Since she left the hospital about 14 days ago she has been able to get about 7 hours of sleep each night which is a great improvement to prior. Until then she got 4 hours on average and felt exhausted. She does not nap in daytime and is mentally and physically active throughout the day. The patient  sleeps usually on 2 pillows at night shares a bedroom with her husband, who has reported that she seems to be more restless more often up and down, tossing and turning. The bedroom is otherwise cool, quiet and dark. There are no external stimulants besides a cell phone. He reported her snoring, but she reports the sam about him. Diana Decker also experienced some hot flashes nocturnally, sometimes diaphoresis, but no palpitations..  She has woken with dull almost global headaches that seem to evaporate over the first was 3 hours of the day. These headaches have been present before her last hospitalization and the change to trazodone. There still present. She has not been woken by stabbing or sharp headaches.  She has a past medical history of hypothyroidism bipolar disorder had an inguinal hernia repair 10 years ago and an appendectomy 5 years ago she appears to be perimenopausal by her own description.  The patient is married and has 2 adult stepchildren, she drinks tea, no smokers and no coffee. You occasionally she will eat chocolate, she does not drink and does not smoke.     Family history is positive for alcoholism in several family members on the maternal and paternal side,  sleep: the patient's father was a loud snorer.  Review of Systems: Out of a complete 14 system review, the patient complains of only the following symptoms, and all other reviewed systems are  negative.  snoring, insomnia, racing thoughts, OCD, Biplar , alcholism.    How likely are you to doze in the following situations: 0 = not likely, 1 = slight chance, 2 = moderate chance, 3 = high chance  Sitting and Reading? 3 Watching Television?2 Sitting inactive in a public place (theater or meeting)?1 As a passenger in a car for an hour without a break?3  Lying down in the afternoon when circumstances permit?3 Sitting and talking to someone?1 Sitting quietly after lunch without alcohol?1 In a car, while stopped for a few  minutes in traffic?0  Total = 14   Fatigue severity score 43  , PHQ-9 depression score 3    Social History   Social History  . Marital Status: Married    Spouse Name: N/A  . Number of Children: N/A  . Years of Education: N/A   Occupational History  . Hospital doctor    Social History Main Topics  . Smoking status: Never Smoker   . Smokeless tobacco: Never Used  . Alcohol Use: No  . Drug Use: No  . Sexual Activity: Yes    Birth Control/ Protection: Surgical     Comment: husband with vasectomy   Other Topics Concern  . 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 Breast cancer Mother   . Prostate cancer Father   . Heart disease Father   . Colon cancer Neg Hx     Past Medical History  Diagnosis Date  . Anemia   . IBS (irritable bowel syndrome)   . GERD (gastroesophageal reflux disease)   . Anxiety   . Bipolar 1 disorder ([place])   . Borderline personality disorder   . Hypothyroidism 06/08/2010  . Alcoholism ([place])   . Depression     Past Surgical History  Procedure Laterality Date  . Appendectomy    . Hernia repair      inguinal    Current Outpatient Prescriptions  Medication Sig Dispense Refill  . carbamazepine (TEGRETOL) 100 MG chewable tablet     . levothyroxine (SYNTHROID, LEVOTHROID) 50 MCG tablet TAKE 1 TABLET (50 MCG TOTAL) BY MOUTH DAILY BEFORE BREAKFAST. 90 tablet 0  . nefazodone (SERZONE) 200 MG tablet Take 400 mg by mouth daily.    Marland Kitchen PROAIR HFA 108 (90 BASE) MCG/ACT inhaler INHALE 2 PUFF(S) EVERY 4 HOURS AS NEEDED  0  . traZODone (DESYREL) 50 MG tablet 100 mg at bedtime as needed.   12  . valACYclovir (VALTREX) 1000 MG tablet Take 1,000 mg by mouth 2 (two) times daily.     No current facility-administered medications for this visit.    Allergies as of 04/25/2015  . (No Known Allergies)    Vitals: BP 92/68 mmHg   Pulse 76  Resp 20  Ht 5\' 7"  (1.702 m)  Wt 188 lb (85.276 kg)  BMI 29.44 kg/m2 Last Weight:  Wt Readings from Last 1 Encounters:  04/25/15 188 lb (85.276 kg)   [id number] mass index is 29.44 kg/(m^2).     Last Height:  Gained 25 pounds through the last 12 month . Ht Readings from Last 1 Encounters:  04/25/15 5\' 7"  (1.702 m)    Physical exam:  General: The patient is awake, alert and appears not in acute distress. The patient is well groomed. Head: Normocephalic, atraumatic. Neck is supple. Mallampati 2,   neck circumference:  14.25 . Nasal airflow unrestricted   Cardiovascular:  Regular rate and rhythm , without  murmurs or carotid bruit, and without distended neck veins. Respiratory: Lungs are clear to auscultation. Skin:  Without evidence of edema, or rash Trunk: BMI is elevated . The patient's posture is erect   Neurologic exam : The patient is awake and alert, oriented to place and time.   Memory subjective  described as intact. Attention span & concentration ability appears normal.  Speech is fluent,  without  dysarthria, dysphonia or aphasia.  Mood and affect are appropriate.  Cranial nerves: Pupils are equal and briskly reactive to light. Funduscopic exam without evidence of pallor or edema.  Extraocular movements  in vertical and horizontal planes intact and without nystagmus. Visual fields by finger perimetry are intact. Hearing to finger rub intact.   Facial sensation intact to fine touch.  Facial motor strength is symmetric and tongue and uvula move midline. Shoulder shrug was symmetrical.   Motor exam:  Normal tone, muscle bulk and symmetric strength in all extremities.  Sensory:  Fine touch, pinprick and vibration were tested in all extremities. Proprioception tested in the upper extremities was normal.  Coordination: Rapid alternating movements in the fingers/hands was normal.  Finger-to-nose maneuver normal without evidence of ataxia, dysmetria or tremor.  Gait  and station: Patient walks without assistive device and is able unassisted to climb up to the exam table. Strength within normal limits.  Stance is stable and normal, Tandem gait is unfragmented. Turns with  3 Steps. Romberg testing is  negative.  Deep tendon reflexes: in the upper and lower extremities are symmetric and intact. Babinski maneuver response is  downgoing.  The patient was advised of the nature of the diagnosed sleep disorder , the treatment options and risks for general a health and wellness arising from not treating the condition.  I spent more than 40 minutes of face to face time with the patient. Greater than 50% of time was spent in counseling and coordination of care. We have discussed the diagnosis and differential and I answered the patient's questions.     Assessment:  After physical and neurologic examination, review of laboratory studies,  Personal review of imaging studies, reports of other /same  Imaging studies ,  Results of polysomnography/ neurophysiology testing and pre-existing records as far as provided in visit., my assessment is   1) Insomnia related to anxiety/ depression- exacerbated with a work related crisis.  Racing thoughts. The underlying bipolar disorder may be contributing but is not the only cause for the exacerbation in insomnia.   2) weight gain over the last 12 month- medication related ? Depression related? Snoring begun with weight gain. Sleep deprivation with only 4 hours average of nocturnal sleep also has a cooperative affect on hypoglycemia, hypertension concentration and cognitive abilities.   3) Menopausal hot flushes and increased sleep fragmentation.   Plan:  Treatment plan and additional workup :  Please remember to try to maintain good sleep hygiene, which means: Keep a regular sleep and wake schedule, try not to exercise or have a meal within 2 hours of your bedtime, try to keep your bedroom conducive for sleep, that is, cool and  dark, without light distractors such as an illuminated alarm clock, and refrain from watching TV right before sleep or in the middle of the night and do not keep the TV or radio on during the night. Also, try not to use or play on electronic devices at bedtime, such as  your cell phone, tablet PC or laptop. If you like to read at bedtime on an electronic device, try to dim the background light as much as possible. Do not eat in the middle of the night.  You can continue the aroma therapy you may use  rose water valerian, lavender. Eliminate caffeine intake after lunchtime. Use decaffeinated tea if you crave the flavor of tea. Also eliminate sugar intake after about 4 PM.    We will request a sleep study.    We will look for leg twitching and snoring or sleep apnea.   For chronic insomnia, you are best followed by your  psychiatrist and/or sleep psychologist.    Lamictal has side effects on sleep. Lamictal seems to suppress slow-wave sleep which is the deepest and most restorative sleep phase.   I do not have concerns for the use of trazodone.  We will call you with the sleep study results and make a follow up appointment if needed.   Diana Seat, MD   Asencion Partridge Diego Delancey MD  04/25/2015   CC: Diana Decker, [place], [place] [id number]

## 2015-04-25 NOTE — Patient Instructions (Signed)
Please remember to try to maintain good sleep hygiene, which means: Keep a regular sleep and wake schedule, try not to exercise or have a meal within 2 hours of your bedtime, try to keep your bedroom conducive for sleep, that is, cool and dark, without light distractors such as an illuminated alarm clock, and refrain from watching TV right before sleep or in the middle of the night and do not keep the TV or radio on during the night. Also, try not to use or play on electronic devices at bedtime, such as your cell phone, tablet PC or laptop. If you like to read at bedtime on an electronic device, try to dim the background light as much as possible. Do not eat in the middle of the night.   We will request a sleep study.    We will look for leg twitching and snoring or sleep apnea.   For chronic insomnia, you are best followed by a psychiatrist and/or sleep psychologist.   We will call you with the sleep study results and make a follow up appointment if needed.   Insomnia Insomnia is a sleep disorder that makes it difficult to fall asleep or to stay asleep. Insomnia can cause tiredness (fatigue), low energy, difficulty concentrating, mood swings, and poor performance at work or school.  There are three different ways to classify insomnia:  Difficulty falling asleep.  Difficulty staying asleep.  Waking up too early in the morning. Any type of insomnia can be long-term (chronic) or short-term (acute). Both are common. Short-term insomnia usually lasts for three months or less. Chronic insomnia occurs at least three times a week for longer than three months. CAUSES  Insomnia may be caused by another condition, situation, or substance, such as:  Anxiety.  Certain medicines.  Gastroesophageal reflux disease (GERD) or other gastrointestinal conditions.  Asthma or other breathing conditions.  Restless legs syndrome, sleep apnea, or other sleep disorders.  Chronic pain.  Menopause. This may  include hot flashes.  Stroke.  Abuse of alcohol, tobacco, or illegal drugs.  Depression.  Caffeine.   Neurological disorders, such as Alzheimer disease.  An overactive thyroid (hyperthyroidism). The cause of insomnia may not be known. RISK FACTORS Risk factors for insomnia include:  Gender. Women are more commonly affected than men.  Age. Insomnia is more common as you get older.  Stress. This may involve your professional or personal life.  Income. Insomnia is more common in people with lower income.  Lack of exercise.   Irregular work schedule or night shifts.  Traveling between different time zones. SIGNS AND SYMPTOMS If you have insomnia, trouble falling asleep or trouble staying asleep is the main symptom. This may lead to other symptoms, such as:  Feeling fatigued.  Feeling nervous about going to sleep.  Not feeling rested in the morning.  Having trouble concentrating.  Feeling irritable, anxious, or depressed. TREATMENT  Treatment for insomnia depends on the cause. If your insomnia is caused by an underlying condition, treatment will focus on addressing the condition. Treatment may also include:   Medicines to help you sleep.  Counseling or therapy.  Lifestyle adjustments. HOME CARE INSTRUCTIONS   Take medicines only as directed by your health care provider.  Keep regular sleeping and waking hours. Avoid naps.  Keep a sleep diary to help you and your health care provider figure out what could be causing your insomnia. Include:   When you sleep.  When you wake up during the night.  How well  you sleep.   How rested you feel the next day.  Any side effects of medicines you are taking.  What you eat and drink.   Make your bedroom a comfortable place where it is easy to fall asleep:  Put up shades or special blackout curtains to block light from outside.  Use a white noise machine to block noise.  Keep the temperature cool.    Exercise regularly as directed by your health care provider. Avoid exercising right before bedtime.  Use relaxation techniques to manage stress. Ask your health care provider to suggest some techniques that may work well for you. These may include:  Breathing exercises.  Routines to release muscle tension.  Visualizing peaceful scenes.  Cut back on alcohol, caffeinated beverages, and cigarettes, especially close to bedtime. These can disrupt your sleep.  Do not overeat or eat spicy foods right before bedtime. This can lead to digestive discomfort that can make it hard for you to sleep.  Limit screen use before bedtime. This includes:  Watching TV.  Using your smartphone, tablet, and computer.  Stick to a routine. This can help you fall asleep faster. Try to do a quiet activity, brush your teeth, and go to bed at the same time each night.  Get out of bed if you are still awake after 15 minutes of trying to sleep. Keep the lights down, but try reading or doing a quiet activity. When you feel sleepy, go back to bed.  Make sure that you drive carefully. Avoid driving if you feel very sleepy.  Keep all follow-up appointments as directed by your health care provider. This is important. SEEK MEDICAL CARE IF:   You are tired throughout the day or have trouble in your daily routine due to sleepiness.  You continue to have sleep problems or your sleep problems get worse. SEEK IMMEDIATE MEDICAL CARE IF:   You have serious thoughts about hurting yourself or someone else.   This information is not intended to replace advice given to you by your health care provider. Make sure you discuss any questions you have with your health care provider.   Document Released: 01/27/2000 Document Revised: 10/20/2014 Document Reviewed: 10/30/2013 Elsevier Interactive Patient Education [employer].

## 2015-05-03 ENCOUNTER — Encounter: Payer: Self-pay | Admitting: *Deleted

## 2015-05-16 ENCOUNTER — Telehealth: Payer: Self-pay

## 2015-05-16 DIAGNOSIS — G4701 Insomnia due to medical condition: Secondary | ICD-10-CM

## 2015-05-16 NOTE — Telephone Encounter (Signed)
Split night order placed.

## 2015-05-16 NOTE — Telephone Encounter (Signed)
-----   Message from [place] sent at 05/16/2015  1:43 PM EDT ----- Can you put in order for split night. Home study was put in but notes says sleep study, look for leg twitching and apnea. No auth needed for [facility]

## 2015-08-17 ENCOUNTER — Other Ambulatory Visit: Payer: Self-pay | Admitting: Medical

## 2015-08-19 ENCOUNTER — Encounter: Payer: BLUE CROSS/BLUE SHIELD | Admitting: Medical

## 2015-08-19 ENCOUNTER — Telehealth: Payer: Self-pay

## 2015-08-19 DIAGNOSIS — Z0289 Encounter for other administrative examinations: Secondary | ICD-10-CM

## 2015-08-19 NOTE — Progress Notes (Signed)
This encounter was created in error - please disregard.

## 2015-08-19 NOTE — Telephone Encounter (Signed)
Left message for pt that she could get some blood work for her thyroid completed today and she would not have to be out of her thyroid medication.

## 2015-08-23 ENCOUNTER — Encounter: Payer: Self-pay | Admitting: Medical

## 2015-09-01 ENCOUNTER — Telehealth: Payer: Self-pay | Admitting: Medical

## 2015-09-01 DIAGNOSIS — E039 Hypothyroidism, unspecified: Secondary | ICD-10-CM

## 2015-09-01 NOTE — Telephone Encounter (Signed)
tsh and tr future order placed. Have her come by and get test. Make sure she has not run out of med. Want her to get test while on thyroid med.

## 2015-09-01 NOTE — Telephone Encounter (Signed)
Patient scheduled labs for 09/06/2015,chart doesn't reflect TSH orders. Patient scheduled follow up of labs with provider  09/20/15

## 2015-09-02 ENCOUNTER — Ambulatory Visit: Payer: Self-pay | Admitting: Medical

## 2015-09-06 ENCOUNTER — Other Ambulatory Visit (INDEPENDENT_AMBULATORY_CARE_PROVIDER_SITE_OTHER): Payer: BLUE CROSS/BLUE SHIELD

## 2015-09-06 DIAGNOSIS — E039 Hypothyroidism, unspecified: Secondary | ICD-10-CM

## 2015-09-06 LAB — TSH: TSH: 5.87 u[IU]/mL — ABNORMAL HIGH (ref 0.35–4.50)

## 2015-09-06 LAB — T4: T4, Total: 5.2 ug/dL (ref 4.5–12.0)

## 2015-09-07 NOTE — Progress Notes (Signed)
Pt has seen results on MyChart and message also sent for patient to call back if any questions.

## 2015-09-20 ENCOUNTER — Ambulatory Visit (INDEPENDENT_AMBULATORY_CARE_PROVIDER_SITE_OTHER): Payer: BLUE CROSS/BLUE SHIELD | Admitting: Medical

## 2015-09-20 ENCOUNTER — Ambulatory Visit (HOSPITAL_BASED_OUTPATIENT_CLINIC_OR_DEPARTMENT_OTHER)
Admission: RE | Admit: 2015-09-20 | Discharge: 2015-09-20 | Disposition: A | Discharge status: Home / Self Care | Payer: BLUE CROSS/BLUE SHIELD | Source: Ambulatory Visit | Attending: Medical | Admitting: Medical

## 2015-09-20 ENCOUNTER — Encounter: Payer: Self-pay | Admitting: Medical

## 2015-09-20 VITALS — BP 106/62 | HR 77 | Temp 97.7°F | Resp 14 | Ht 67.0 in | Wt 199.8 lb

## 2015-09-20 DIAGNOSIS — J309 Allergic rhinitis, unspecified: Secondary | ICD-10-CM | POA: Diagnosis not present

## 2015-09-20 DIAGNOSIS — J3489 Other specified disorders of nose and nasal sinuses: Secondary | ICD-10-CM

## 2015-09-20 DIAGNOSIS — E039 Hypothyroidism, unspecified: Secondary | ICD-10-CM

## 2015-09-20 DIAGNOSIS — E049 Nontoxic goiter, unspecified: Secondary | ICD-10-CM

## 2015-09-20 DIAGNOSIS — E010 Iodine-deficiency related diffuse (endemic) goiter: Secondary | ICD-10-CM

## 2015-09-20 DIAGNOSIS — N951 Menopausal and female climacteric states: Secondary | ICD-10-CM

## 2015-09-20 MED ORDER — FLUTICASONE PROPIONATE 50 MCG/ACT NA SUSP: 2.0000 | Freq: Every day | NASAL | 1 refills | Status: DC

## 2015-09-20 MED ORDER — CEFDINIR 300 MG PO CAPS: 300.0000 mg | ORAL_CAPSULE | Freq: Two times a day (BID) | ORAL | 0 refills | Status: DC

## 2015-09-20 NOTE — Patient Instructions (Addendum)
For your hypothyroidism will keep you on same dose presently. Your tsh is mild high but t4 in normal range.   On exam today your thyroid feels mild enlarged. I want to get [place] of neck/thyorid region. After results back may decide to refer you to endocrinologist.  For allergic rhinitis vs uri will rx flonase nasal spray. See if this improves your nasal congestion. If despite nasal spray sinus pressure persists then start cefdnir antibioitc.   Regarding your possible perimenopausal pattern would ask you get gyn to order fsh. Sounds like this has been in past and they can compare/see if fsh level is rising.  Follow up in 10-14 days or as needed

## 2015-09-20 NOTE — Progress Notes (Signed)
Subjective:    Patient ID: Diana Decker, female    DOB: [birth year], 50 y.o.   MRN: [id number]  HPI   Pt in for follow up on her tsh and t4 levels. Pt states she has been taking synthroid 50 mcg q day for at least a year. Maybe longer. Initially prescribed by provider at [facility]. Pt tsh recently mild high. T4 in lower range of normal. Pt states some weight gain. She states she has very stressful job. She has gained weight that she had previously lost. She states about year ago had lost 25 lbs. Pt had free access to food pantry at work. She buckled down and ate healthy over past 3 months but still gained weight.   Pt states last 2 days she got nasal congested. Pt states a lot of coworkers have been sick. 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 Right now pt only has watery runny nose.  Pt mood is overall better. She is seeing psychiatrist Dr. Caprice Beaver.   Pt describes perimenopausal menses pattern. Pt sees gyn.     Review of Systems  Constitutional: Positive for fatigue. Negative for chills and fever.       Some fatigue even before she got congested.  HENT: Positive for congestion, postnasal drip, rhinorrhea and sinus pressure. Negative for ear discharge, ear pain, facial swelling, sneezing and sore throat.   Respiratory: Negative for cough, chest tightness, shortness of breath and wheezing.   Cardiovascular: Negative for chest pain and palpitations.  Gastrointestinal: Negative for abdominal pain.  Musculoskeletal: Negative for back pain.  Neurological: Negative for dizziness, syncope, speech difficulty, weakness, numbness and headaches.  Hematological: Negative for adenopathy. Does not bruise/bleed easily.  Psychiatric/Behavioral: Negative for behavioral problems, decreased concentration and dysphoric mood.    Past Medical History:  Diagnosis Date  . Alcoholism ([place])   . Anemia   . Anxiety   .  Bipolar 1 disorder ([place])   . Borderline personality disorder   . Depression   . GERD (gastroesophageal reflux disease)   . Hypothyroidism 06/08/2010  . IBS (irritable bowel syndrome)      Social History   Social History  . Marital status: Married    Spouse name: N/A  . Number of children: N/A  . Years of education: N/A   Occupational History  . Hospital doctor    Social History Main Topics  . Smoking status: Never Smoker  . Smokeless tobacco: Never Used  . Alcohol use No  . Drug use: No  . Sexual activity: Yes    Birth control/ protection: Surgical     Comment: husband with vasectomy   Other Topics Concern  . 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 APPENDECTOMY    . HERNIA REPAIR     inguinal    Family History  Problem Relation Age of Onset  . Breast cancer Mother   . Prostate cancer Father   . Heart disease Father   . Colon cancer Neg Hx     No Known Allergies  Current Outpatient Prescriptions on File Prior to Visit  Medication Sig Dispense Refill  . carbamazepine (TEGRETOL) 100 MG chewable tablet     . levothyroxine (SYNTHROID, LEVOTHROID) 50 MCG tablet TAKE 1 TABLET (50 MCG TOTAL) BY  MOUTH DAILY BEFORE BREAKFAST. 90 tablet 0  . nefazodone (SERZONE) 200 MG tablet Take 400 mg by mouth daily.    Marland Kitchen PROAIR HFA 108 (90 BASE) MCG/ACT inhaler INHALE 2 PUFF(S) EVERY 4 HOURS AS NEEDED  0  . traZODone (DESYREL) 50 MG tablet 100 mg at bedtime as needed.   12  . valACYclovir (VALTREX) 1000 MG tablet Take 1,000 mg by mouth 2 (two) times daily.     No current facility-administered medications on file prior to visit.     BP 106/62 (BP Location: Left Arm, Patient Position: Sitting, Cuff Size: Normal)   Pulse 77   Temp 97.7 F (36.5 C) (Oral)   Resp 14   Ht 5\' 7"  (1.702 m)   Wt 199 lb 12.8 oz (90.6 kg)   LMP 05/21/2015   SpO2  98%   BMI 31.29 kg/m       Objective:   Physical Exam  General  Mental Status - Alert. General Appearance - Well groomed. Not in acute distress.  Skin Rashes- No Rashes.  HEENT Head- Normal. Ear Auditory Canal - Left- Normal. Right - Normal.Tympanic Membrane- Left- Normal. Right- Normal. Eye Sclera/Conjunctiva- Left- Normal. Right- Normal. Nose & Sinuses Nasal Mucosa- Left-  Boggy and Congested. Right-  Boggy and  Congested.Bilateral maxillary and frontal sinus pressure. Mouth & Throat Lips: Upper Lip- Normal: no dryness, cracking, pallor, cyanosis, or vesicular eruption. Lower Lip-Normal: no dryness, cracking, pallor, cyanosis or vesicular eruption. Buccal Mucosa- Bilateral- No Aphthous ulcers. Oropharynx- No Discharge or Erythema. Tonsils: Characteristics- Bilateral- No Erythema or Congestion. Size/Enlargement- Bilateral- No enlargement. Discharge- bilateral-None.  Neck Neck- Supple. No Masses. Pt has feel and appearanc of mild thyromegaly.   Chest and Lung Exam Auscultation: Breath Sounds:-Clear even and unlabored.  Cardiovascular Auscultation:Rythm- Regular, rate and rhythm. Murmurs & Other Heart Sounds:Ausculatation of the heart reveal- No Murmurs.  Lymphatic Head & Neck General Head & Neck Lymphatics: Bilateral: Description- No Localized lymphadenopathy.       Assessment & Plan:  For your hypothyroidism will keep you on same dose presently. Your tsh is mild high but t4 in normal range.   On exam today your thyroid feels mild enlarged. I want to get [place] of neck/thyorid region(pt informed get done at 11:30 am today). After results back may decide to refer you to endocrinologist.  For allergic rhinitis vs uri will rx flonase nasal spray. See if this improves your nasal congestion. If despite nasal spray sinus pressure persists then start cefdnir antibioitc.   Regarding your possible perimenopausal pattern would ask you get gyn to order fsh. Sounds like this  has been in past and they can compare/see if fsh level is rising.  Follow up in 10-14 days or as needed   Amel Gianino, Percell Miller, [employer]

## 2015-09-20 NOTE — Progress Notes (Signed)
Pre visit review using our clinic review tool, if applicable. No additional management support is needed unless otherwise documented below in the visit note./HSM  

## 2015-09-21 NOTE — Progress Notes (Signed)
Pt has seen results on MyChart and message also sent for patient to call back if any questions.

## 2015-12-06 ENCOUNTER — Other Ambulatory Visit: Payer: Self-pay | Admitting: Medical

## 2015-12-13 DIAGNOSIS — F489 Nonpsychotic mental disorder, unspecified: Secondary | ICD-10-CM | POA: Insufficient documentation

## 2015-12-13 HISTORY — DX: Nonpsychotic mental disorder, unspecified: F48.9

## 2016-03-01 ENCOUNTER — Other Ambulatory Visit: Payer: Self-pay | Admitting: Medical

## 2016-03-05 NOTE — Telephone Encounter (Signed)
Needs to follow up before any more refills

## 2016-03-06 ENCOUNTER — Telehealth: Payer: Self-pay | Admitting: *Deleted

## 2016-03-06 NOTE — Telephone Encounter (Signed)
Faxed refill request received from [facility] for Levothyroxine 32mcg Last filled by MD on 03/05/16, #30x0 Last AEX - 09/20/15 Next AEX - patient was due back in office in October 2017 to F/U on Thyroid enlargement and for repeat labs.  Please call patient and schedule F/U office visit prior to any future refill authorizations per provider/SLS 01/23

## 2016-03-07 NOTE — Telephone Encounter (Signed)
LVM informing patient that she is due for a follow up appt before any refills will be given. Informed patient to call office to schedule.

## 2016-04-11 ENCOUNTER — Other Ambulatory Visit: Payer: Self-pay | Admitting: Medical

## 2016-04-13 NOTE — Telephone Encounter (Signed)
Rx request Denied please see last note below]; please call patient and schedule OV with labs prior to any future refill authorizations per provider/SLS 03/02

## 2016-04-24 ENCOUNTER — Telehealth: Payer: Self-pay | Admitting: Medical

## 2016-04-24 NOTE — Telephone Encounter (Signed)
Patient request to transfer care from [facility] to [facility].

## 2016-04-24 NOTE — Telephone Encounter (Signed)
I am ok with her switching to [facility] if she ok's.

## 2016-04-24 NOTE — Telephone Encounter (Signed)
Ok with me 

## 2016-05-02 ENCOUNTER — Other Ambulatory Visit: Payer: Self-pay | Admitting: Medical

## 2016-05-08 NOTE — Telephone Encounter (Signed)
Requested drug refills are authorized 15-day Only, however, the patient needs further evaluation and/or laboratory testing before further refills are given. Ask her to make an appointment for this/SLS 03/27  Patient transferred Care to [name] on 04/24/2016; patient will need OV with new PCP, as Percell Miller will no longer authorize refills [last OV 09/20/15 and last Lab 09/06/15]. Please call patient and schedule Transfer Of Care appointment with Melissa/SLS 03/27

## 2016-05-08 NOTE — Telephone Encounter (Signed)
Patient scheduled with Melissa for 05/29/2016

## 2016-05-28 ENCOUNTER — Encounter: Payer: Self-pay | Admitting: Behavioral Health

## 2016-05-28 ENCOUNTER — Telehealth: Payer: Self-pay | Admitting: Behavioral Health

## 2016-05-28 NOTE — Addendum Note (Signed)
Addended by: Eduard Roux E on: 05/28/2016 01:58 PM   Modules accepted: Orders

## 2016-05-28 NOTE — Telephone Encounter (Signed)
Pre-Visit Call completed with patient and chart updated.   Pre-Visit Info documented in Specialty Comments under [employer].    

## 2016-05-28 NOTE — Telephone Encounter (Signed)
Patient returned your call, she is working today but you can call her cell back and if she is available she will answer

## 2016-05-28 NOTE — Telephone Encounter (Signed)
Unable to reach patient at time of Pre-Visit Call.  Left message for patient to return call when available.    

## 2016-05-29 ENCOUNTER — Encounter: Payer: Self-pay | Admitting: Family

## 2016-05-29 ENCOUNTER — Ambulatory Visit (INDEPENDENT_AMBULATORY_CARE_PROVIDER_SITE_OTHER): Payer: BLUE CROSS/BLUE SHIELD | Admitting: Family

## 2016-05-29 ENCOUNTER — Other Ambulatory Visit: Payer: Self-pay | Admitting: Medical

## 2016-05-29 VITALS — BP 119/74 | HR 75 | Temp 98.1°F | Resp 16 | Ht 67.0 in | Wt 217.6 lb

## 2016-05-29 DIAGNOSIS — F319 Bipolar disorder, unspecified: Secondary | ICD-10-CM | POA: Diagnosis not present

## 2016-05-29 DIAGNOSIS — E039 Hypothyroidism, unspecified: Secondary | ICD-10-CM | POA: Diagnosis not present

## 2016-05-29 DIAGNOSIS — A6000 Herpesviral infection of urogenital system, unspecified: Secondary | ICD-10-CM | POA: Insufficient documentation

## 2016-05-29 DIAGNOSIS — K589 Irritable bowel syndrome without diarrhea: Secondary | ICD-10-CM | POA: Diagnosis not present

## 2016-05-29 DIAGNOSIS — K219 Gastro-esophageal reflux disease without esophagitis: Secondary | ICD-10-CM

## 2016-05-29 HISTORY — DX: Herpesviral infection of urogenital system, unspecified: A60.00

## 2016-05-29 LAB — TSH: TSH: 4.09 u[IU]/mL (ref 0.35–4.50)

## 2016-05-29 NOTE — Assessment & Plan Note (Signed)
Some weight gain. Last TSH was elevated nearly a year ago. Obtain follow up tsh, plan to adjust as needed. Continue synthroid. We did discuss healthy diet, exercise and weight loss.

## 2016-05-29 NOTE — Assessment & Plan Note (Signed)
Stable, resolved per patient.

## 2016-05-29 NOTE — Assessment & Plan Note (Signed)
Depression uncontrolled. Advised pt to follow up as scheduled with psychiatry. She understands that should she develop plan to hurt self she needs to proceed to the ER and she is agreeable to this plan.

## 2016-05-29 NOTE — Progress Notes (Signed)
Subjective:    Patient ID: Diana Decker, female    DOB: August 19, 1965, 51 y.o.   MRN: [id number]  HPI  Ms. Diana Decker is a 51 yr old female who presents today in transfer from another provider in our group.   Hypothyroid- reports that she has had hypothyroid for several years. Reports good med compliance. Lab Results  Component Value Date   TSH 5.87 (H) 09/06/2015   Bipolar disorder-Followed at [facility] and also sees a therapist every other week. Reports that they ar adjusting her meds for her depression. She has an upcoming follow up in a feel weeks.  Reports that she does have thoughts some days of "not wanting to go on." Denies plan.  Reports that psychiatry is aware.   IBS- reports that this is stable and was mainly due to stress.    Hx of alcohol abuse- reports clean x 16 years.   GERD-reports that this is generally well controlled.  Generally only due to dietary indiscretion and relieved by prn tums.  Weight gain- thinks related to med changes or depression.   Wt Readings from Last 3 Encounters:  05/29/16 217 lb 9.6 oz (98.7 kg)  09/20/15 199 lb 12.8 oz (90.6 kg)  04/25/15 188 lb (85.3 kg)   Genital herpes- on suppressive therapy. No recent outbreaks.  Review of Systems    see HPI  Past Medical History:  Diagnosis Date  . Alcoholism ([place])   . Anemia   . Anxiety   . Bipolar 1 disorder ([place])   . Borderline personality disorder   . Depression   . GERD (gastroesophageal reflux disease)   . Hypothyroidism 06/08/2010  . IBS (irritable bowel syndrome)      Social History   Social History  . Marital status: Married    Spouse name: N/A  . Number of children: N/A  . Years of education: N/A   Occupational History  . Hospital doctor    Social History Main Topics  . Smoking status: Never Smoker  . Smokeless tobacco: Never Used  . Alcohol use No  . Drug use: No  . Sexual activity: Yes    Birth control/ protection: Surgical     Comment: husband  with vasectomy   Other Topics Concern  . 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 APPENDECTOMY    . HERNIA REPAIR     inguinal    Family History  Problem Relation Age of Onset  . Breast cancer Mother   . Prostate cancer Father   . Heart disease Father   . Colon cancer Neg Hx     No Known Allergies  Current Outpatient Prescriptions on File Prior to Visit  Medication Sig Dispense Refill  . levothyroxine (SYNTHROID, LEVOTHROID) 50 MCG tablet TAKE 1 TABLET BY MOUTH EVERY DAY BEFORE BREAKFAST 15 tablet 0  . PROAIR HFA 108 (90 BASE) MCG/ACT inhaler INHALE 2 PUFF(S) EVERY 4 HOURS AS NEEDED  0  . valACYclovir (VALTREX) 1000 MG tablet Take 1,000 mg by mouth 2 (two) times daily.     No current facility-administered medications on file prior to visit.     BP 119/74 (BP Location: Right Arm, Cuff Size: Large)   Pulse 75   Temp 98.1 F (36.7 C) (Oral)   Resp 16   Ht 5\' 7"  (1.702 m)   Wt 217 lb  9.6 oz (98.7 kg)   LMP 11/29/2015   SpO2 97% Comment: room air  BMI 34.08 kg/m    Objective:   Physical Exam  Constitutional: She is oriented to person, place, and time. She appears well-developed and well-nourished.  HENT:  Head: Normocephalic and atraumatic.  Cardiovascular: Normal rate, regular rhythm and normal heart sounds.   No murmur heard. Pulmonary/Chest: Effort normal and breath sounds normal. No respiratory distress. She has no wheezes.  Musculoskeletal: She exhibits no edema.  Neurological: She is alert and oriented to person, place, and time.  Psychiatric: She has a normal mood and affect. Her behavior is normal. Judgment and thought content normal.          Assessment & Plan:

## 2016-05-29 NOTE — Progress Notes (Signed)
Pre visit review using our clinic review tool, if applicable. No additional management support is needed unless otherwise documented below in the visit note. 

## 2016-05-29 NOTE — Assessment & Plan Note (Signed)
Stable on suppressive therapy (valtrex). Continue same.

## 2016-05-29 NOTE — Patient Instructions (Signed)
Please complete lab work prior to leaving. Please focus on a low fat diet with plenty of lean protein, fresh fruits/veggies.

## 2016-05-29 NOTE — Assessment & Plan Note (Signed)
Stable with prn use of tums.

## 2016-05-30 MED ORDER — LEVOTHYROXINE SODIUM 50 MCG PO TABS: 50.0000 ug | ORAL_TABLET | Freq: Every day | ORAL | 5 refills | Status: DC

## 2016-05-30 NOTE — Telephone Encounter (Signed)
Melissa-- please see most recent lab result and advise refill?

## 2016-07-31 ENCOUNTER — Encounter: Payer: Self-pay | Admitting: Family

## 2016-07-31 ENCOUNTER — Ambulatory Visit (INDEPENDENT_AMBULATORY_CARE_PROVIDER_SITE_OTHER): Payer: BLUE CROSS/BLUE SHIELD | Admitting: Family

## 2016-07-31 VITALS — BP 109/67 | HR 64 | Temp 98.0°F | Resp 18 | Ht 67.0 in | Wt 217.0 lb

## 2016-07-31 DIAGNOSIS — Z0000 Encounter for general adult medical examination without abnormal findings: Secondary | ICD-10-CM | POA: Diagnosis not present

## 2016-07-31 MED ORDER — VALACYCLOVIR HCL 1 G PO TABS: 500.0000 mg | ORAL_TABLET | Freq: Every day | ORAL | 1 refills | Status: DC

## 2016-07-31 MED ORDER — LEVOTHYROXINE SODIUM 50 MCG PO TABS: 50.0000 ug | ORAL_TABLET | Freq: Every day | ORAL | 1 refills | Status: DC

## 2016-07-31 NOTE — Patient Instructions (Signed)
Please complete lab work. Continue the good work with healthy diet, exercise and weight loss.

## 2016-07-31 NOTE — Progress Notes (Signed)
Subjective:    Patient ID: Diana Decker, female    DOB: 25-Apr-1965, 51 y.o.   MRN: [id number]  HPI  Diana Decker is a 51 yr old female who presents today for complete physical:  Patient presents today for complete physical.  Immunizations: tetanus 01/06/2008 Diet: reports that her diet is much better , had some weight gain when on abilify. Has lost 9 pounds in the last 1 month. Wt Readings from Last 3 Encounters:  07/31/16 217 lb (98.4 kg)  05/29/16 217 lb 9.6 oz (98.7 kg)  09/20/15 199 lb 12.8 oz (90.6 kg)  Exercise: walking more, elliptical Colonoscopy: 05/03/09- normal.  Pap Smear: 10/17- normal per patient Mammogram:  10/17 Vision: up to date Dental: up todate      Review of Systems  Constitutional: Negative for unexpected weight change.  HENT: Negative for hearing loss and rhinorrhea.   Eyes: Negative for visual disturbance.  Cardiovascular: Negative for leg swelling.  Gastrointestinal: Negative for constipation and diarrhea.  Genitourinary: Negative for dysuria and frequency.  Musculoskeletal: Negative for arthralgias and myalgias.  Neurological: Negative for headaches.       Reports mild RLS symptoms  Hematological: Negative for adenopathy.  Psychiatric/Behavioral:       Feels like depression is improved on current medications    Past Medical History:  Diagnosis Date  . Alcoholism ([place])   . Anemia   . Anxiety   . Bipolar 1 disorder ([place])   . Borderline personality disorder   . Depression   . GERD (gastroesophageal reflux disease)   . Hypothyroidism 06/08/2010  . IBS (irritable bowel syndrome)      Social History   Social History  . Marital status: Married    Spouse name: N/A  . Number of children: N/A  . Years of education: N/A   Occupational History  . Hospital doctor    Social History Main Topics  . Smoking status: Never Smoker  . Smokeless tobacco: Never Used  . Alcohol use No  . Drug use: No  . Sexual activity: Yes    Birth control/  protection: Surgical     Comment: husband with vasectomy   Other Topics Concern  . 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 APPENDECTOMY    . HERNIA REPAIR     inguinal    Family History  Problem Relation Age of Onset  . Breast cancer Mother   . Prostate cancer Father   . Heart disease Father   . Colon cancer Neg Hx     No Known Allergies  Current Outpatient Prescriptions on File Prior to Visit  Medication Sig Dispense Refill  . levothyroxine (SYNTHROID, LEVOTHROID) 50 MCG tablet Take 1 tablet (50 mcg total) by mouth daily before breakfast. 30 tablet 5  . valACYclovir (VALTREX) 1000 MG tablet Take 1,000 mg by mouth 2 (two) times daily.     No current facility-administered medications on file prior to visit.     BP 109/67 (BP Location: Right Arm, Cuff Size: Large)   Pulse 64   Temp 98 F (36.7 C) (Oral)   Resp 18   Ht 5\' 7"  (1.702 m)   Wt 217 lb (98.4 kg)   SpO2 99%   BMI 33.99 kg/m       Objective:   Physical Exam  Physical Exam  Constitutional: She is oriented to  person, place, and time. She appears well-developed and well-nourished. No distress.  HENT:  Head: Normocephalic and atraumatic.  Right Ear: Tympanic membrane and ear canal normal.  Left Ear: Tympanic membrane and ear canal normal.  Mouth/Throat: Oropharynx is clear and moist.  Eyes: Pupils are equal, round, and reactive to light. No scleral icterus.  Neck: Normal range of motion. No thyromegaly present.  Cardiovascular: Normal rate and regular rhythm.   No murmur heard. Pulmonary/Chest: Effort normal and breath sounds normal. No respiratory distress. He has no wheezes. She has no rales. She exhibits no tenderness.  Abdominal: Soft. Bowel sounds are normal. She exhibits no distension and no mass. There is no tenderness. There is no rebound and no guarding.    Musculoskeletal: She exhibits no edema.  Lymphadenopathy:    She has no cervical adenopathy.  Neurological: She is alert and oriented to person, place, and time. She has normal patellar reflexes. She exhibits normal muscle tone. Coordination normal.  Skin: Skin is warm and dry.  Psychiatric: She has a normal mood and affect. Her behavior is normal. Judgment and thought content normal.  Breasts: Examined lying Right: Without masses, retractions, discharge or axillary adenopathy.  Left: Without masses, retractions, discharge or axillary adenopathy.  Pelvic: deferred        Assessment & Plan:         Assessment & Plan:  Preventative care- Discussed healthy diet, regular exercise, weight loss.  Tetanus up to date. EKG tracing is personally reviewed.  EKG notes NSR.  No acute changes.  Pap, mammo, colo up to date.  Obtain routine lab work.

## 2016-12-13 LAB — HM MAMMOGRAPHY: HM Mammogram: NORMAL (ref 0–4)

## 2017-01-11 DIAGNOSIS — E663 Overweight: Secondary | ICD-10-CM | POA: Insufficient documentation

## 2017-01-11 DIAGNOSIS — E669 Obesity, unspecified: Secondary | ICD-10-CM | POA: Insufficient documentation

## 2017-01-11 DIAGNOSIS — L292 Pruritus vulvae: Secondary | ICD-10-CM | POA: Insufficient documentation

## 2017-01-15 LAB — HM MAMMOGRAPHY

## 2017-07-01 ENCOUNTER — Encounter: Payer: Self-pay | Admitting: Family

## 2017-07-01 ENCOUNTER — Ambulatory Visit (INDEPENDENT_AMBULATORY_CARE_PROVIDER_SITE_OTHER): Payer: BLUE CROSS/BLUE SHIELD | Admitting: Family

## 2017-07-01 VITALS — BP 120/76 | HR 82 | Temp 98.3°F | Resp 18 | Ht 67.0 in | Wt 225.6 lb

## 2017-07-01 DIAGNOSIS — H029 Unspecified disorder of eyelid: Secondary | ICD-10-CM | POA: Diagnosis not present

## 2017-07-01 DIAGNOSIS — E010 Iodine-deficiency related diffuse (endemic) goiter: Secondary | ICD-10-CM | POA: Diagnosis not present

## 2017-07-01 DIAGNOSIS — E039 Hypothyroidism, unspecified: Secondary | ICD-10-CM | POA: Diagnosis not present

## 2017-07-01 LAB — HM PAP SMEAR: HM Pap smear: NORMAL

## 2017-07-01 LAB — HM MAMMOGRAPHY: HM Mammogram: NORMAL (ref 0–4)

## 2017-07-01 LAB — TSH: TSH: 2.98 u[IU]/mL (ref 0.35–4.50)

## 2017-07-01 MED ORDER — LEVOTHYROXINE SODIUM 50 MCG PO TABS: 50.0000 ug | ORAL_TABLET | Freq: Every day | ORAL | 1 refills | Status: DC

## 2017-07-01 NOTE — Progress Notes (Signed)
Subjective:    Patient ID: Diana Decker, female    DOB: 08-26-1965, 52 y.o.   MRN: [id number]  HPI  Patient is a 52 yr old female who presents today with chief complaint of growth on her left lower eyelid. Reports that the growth has been present x 1 month and is enlarging.  She is also due for follow up of her hypothyroid- she is maintained on synthroid 50 mcg.  Lab Results  Component Value Date   TSH 4.09 05/29/2016    Review of Systems See HPI  Past Medical History:  Diagnosis Date  . Alcoholism ([place])   . Anemia   . Anxiety   . Bipolar 1 disorder ([place])   . Borderline personality disorder ([place])   . Depression   . GERD (gastroesophageal reflux disease)   . Hypothyroidism 06/08/2010  . IBS (irritable bowel syndrome)      Social History   Socioeconomic History  . Marital status: Married    Spouse name: Not on file  . Number of children: Not on file  . Years of education: Not on file  . Highest education level: Not on file  Occupational History  . Occupation: Hospital doctor  Social Needs  . Financial resource strain: Not on file  . Food insecurity:    Worry: Not on file    Inability: Not on file  . Transportation needs:    Medical: Not on file    Non-medical: Not on file  Tobacco Use  . Smoking status: Never Smoker  . Smokeless tobacco: Never Used  Substance and Sexual Activity  . Alcohol use: No  . Drug use: No  . Sexual activity: Yes    Birth control/protection: Surgical    Comment: husband with vasectomy  Lifestyle  . Physical activity:    Days per week: Not on file    Minutes per session: Not on file  . Stress: Not on file  Relationships  . Social connections:    Talks on phone: Not on file    Gets together: Not on file    Attends religious service: Not on file    Active member of club or organization: Not on file    Attends meetings of clubs or organizations: Not on file    Relationship status: Not on file  . Intimate partner violence:   Fear of current or ex partner: Not on file    Emotionally abused: Not on file    Physically abused: Not on file    Forced sexual activity: Not on file  Other Topics Concern  . 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 APPENDECTOMY    . HERNIA REPAIR     inguinal    Family History  Problem Relation Age of Onset  . Breast cancer Mother   . Prostate cancer Father   . Heart disease Father   . Colon cancer Neg Hx     No Known Allergies  Current Outpatient Medications on File Prior to Visit  Medication Sig Dispense Refill  . EQUETRO 200 MG CP12 12 hr capsule Take 400 mg by mouth 2 (two) times daily.  0  . levothyroxine (SYNTHROID, LEVOTHROID) 50 MCG tablet Take 1 tablet (50 mcg total) by mouth daily before breakfast. 90 tablet 1  . lurasidone (LATUDA) 40 MG TABS tablet Take 40 mg by mouth at bedtime.    Marland Kitchen  valACYclovir (VALTREX) 1000 MG tablet Take 0.5 tablets (500 mg total) by mouth daily. 45 tablet 1   No current facility-administered medications on file prior to visit.     BP 120/76 (BP Location: Right Arm, Cuff Size: Large)   Pulse 82   Temp 98.3 F (36.8 C) (Oral)   Resp 18   Ht 5\' 7"  (1.702 m)   Wt 225 lb 9.6 oz (102.3 kg)   SpO2 100%   BMI 35.33 kg/m       Objective:   Physical Exam  Constitutional: She appears well-developed and well-nourished.  Eyes:  Raised lesion left lower eyelid  Neck: Thyromegaly present.  Left thyroid lobe >R  Psychiatric:  Flat affect           Assessment & Plan:  Lesion of eyelid- newto ophthalmology for further evaluation.  Hypothyroid- clinically stable on synthroid, continue same. Obtain follow up tsh.   thyromegaly- check follow up thyroid ultrasound.

## 2017-07-01 NOTE — Patient Instructions (Signed)
Please complete lab work prior to leaving. You should be contacted about your referral to the eye doctor and about your thyroid ultrasound.

## 2017-07-15 ENCOUNTER — Ambulatory Visit (HOSPITAL_BASED_OUTPATIENT_CLINIC_OR_DEPARTMENT_OTHER)
Admission: RE | Admit: 2017-07-15 | Discharge: 2017-07-15 | Disposition: A | Discharge status: Home / Self Care | Payer: BLUE CROSS/BLUE SHIELD | Source: Ambulatory Visit | Attending: Family | Admitting: Family

## 2017-07-15 ENCOUNTER — Encounter: Payer: Self-pay | Admitting: Family

## 2017-07-15 DIAGNOSIS — E010 Iodine-deficiency related diffuse (endemic) goiter: Secondary | ICD-10-CM | POA: Insufficient documentation

## 2017-08-02 ENCOUNTER — Encounter: Payer: Self-pay | Admitting: Family

## 2017-08-02 ENCOUNTER — Other Ambulatory Visit: Payer: Self-pay | Admitting: Surgery

## 2017-08-02 LAB — HM DIABETES EYE EXAM

## 2017-08-19 ENCOUNTER — Encounter: Payer: Self-pay | Admitting: Family

## 2017-08-19 ENCOUNTER — Ambulatory Visit (INDEPENDENT_AMBULATORY_CARE_PROVIDER_SITE_OTHER): Payer: BLUE CROSS/BLUE SHIELD | Admitting: Family

## 2017-08-19 VITALS — HR 75 | Temp 98.3°F | Resp 18 | Ht 67.0 in | Wt 225.2 lb

## 2017-08-19 DIAGNOSIS — Z23 Encounter for immunization: Secondary | ICD-10-CM | POA: Diagnosis not present

## 2017-08-19 DIAGNOSIS — C44121 Squamous cell carcinoma of skin of unspecified eyelid, including canthus: Secondary | ICD-10-CM | POA: Insufficient documentation

## 2017-08-19 DIAGNOSIS — Z0000 Encounter for general adult medical examination without abnormal findings: Secondary | ICD-10-CM | POA: Diagnosis not present

## 2017-08-19 DIAGNOSIS — E785 Hyperlipidemia, unspecified: Secondary | ICD-10-CM

## 2017-08-19 HISTORY — DX: Hyperlipidemia, unspecified: E78.5

## 2017-08-19 HISTORY — DX: Squamous cell carcinoma of skin of unspecified eyelid, including canthus: C44.121

## 2017-08-19 LAB — URINALYSIS, ROUTINE W REFLEX MICROSCOPIC
Bilirubin Urine: NEGATIVE
Hgb urine dipstick: NEGATIVE
Ketones, ur: NEGATIVE
Leukocytes, UA: NEGATIVE
Nitrite: NEGATIVE
RBC / HPF: NONE SEEN (ref 0–?)
Specific Gravity, Urine: 1.015 (ref 1.000–1.030)
Total Protein, Urine: NEGATIVE
Urine Glucose: NEGATIVE
Urobilinogen, UA: 0.2 (ref 0.0–1.0)
pH: 6 (ref 5.0–8.0)

## 2017-08-19 LAB — CBC WITH DIFFERENTIAL/PLATELET
Basophils Absolute: 0 10*3/uL (ref 0.0–0.1)
Basophils Relative: 0.8 % (ref 0.0–3.0)
Eosinophils Absolute: 0.3 10*3/uL (ref 0.0–0.7)
Eosinophils Relative: 6.8 % — ABNORMAL HIGH (ref 0.0–5.0)
HCT: 39.2 % (ref 36.0–46.0)
Hemoglobin: 13.1 g/dL (ref 12.0–15.0)
Lymphocytes Relative: 41.3 % (ref 12.0–46.0)
Lymphs Abs: 2 10*3/uL (ref 0.7–4.0)
MCHC: 33.4 g/dL (ref 30.0–36.0)
MCV: 95.6 fl (ref 78.0–100.0)
Monocytes Absolute: 0.4 10*3/uL (ref 0.1–1.0)
Monocytes Relative: 8.5 % (ref 3.0–12.0)
Neutro Abs: 2 10*3/uL (ref 1.4–7.7)
Neutrophils Relative %: 42.6 % — ABNORMAL LOW (ref 43.0–77.0)
Platelets: 299 10*3/uL (ref 150.0–400.0)
RBC: 4.1 Mil/uL (ref 3.87–5.11)
RDW: 13.4 % (ref 11.5–15.5)
WBC: 4.7 10*3/uL (ref 4.0–10.5)

## 2017-08-19 LAB — HEPATIC FUNCTION PANEL
ALT: 17 U/L (ref 0–35)
AST: 14 U/L (ref 0–37)
Albumin: 4.4 g/dL (ref 3.5–5.2)
Alkaline Phosphatase: 102 U/L (ref 39–117)
Bilirubin, Direct: 0.1 mg/dL (ref 0.0–0.3)
Total Bilirubin: 0.3 mg/dL (ref 0.2–1.2)
Total Protein: 7.5 g/dL (ref 6.0–8.3)

## 2017-08-19 LAB — BASIC METABOLIC PANEL
BUN: 10 mg/dL (ref 6–23)
CO2: 31 mEq/L (ref 19–32)
Calcium: 9.9 mg/dL (ref 8.4–10.5)
Chloride: 102 mEq/L (ref 96–112)
Creatinine, Ser: 0.65 mg/dL (ref 0.40–1.20)
GFR: 101.93 mL/min (ref >60.00)
Glucose, Bld: 100 mg/dL — ABNORMAL HIGH (ref 70–99)
Potassium: 4.8 mEq/L (ref 3.5–5.1)
Sodium: 139 mEq/L (ref 135–145)

## 2017-08-19 LAB — LIPID PANEL
Cholesterol: 307 mg/dL — ABNORMAL HIGH (ref 0–200)
HDL: 69.7 mg/dL (ref >39.00)
LDL Cholesterol: 207 mg/dL — ABNORMAL HIGH (ref 0–99)
NonHDL: 237.1
Total CHOL/HDL Ratio: 4
Triglycerides: 150 mg/dL — ABNORMAL HIGH (ref 0.0–149.0)
VLDL: 30 mg/dL (ref 0.0–40.0)

## 2017-08-19 NOTE — Progress Notes (Signed)
Subjective:    Patient ID: Diana Decker, female    DOB: 16-May-1965, 51 y.o.   MRN: [id number]  HPI  Patient presents today for complete physical.  Immunizations: Tdap today and shingrix Diet:  Healthy Wt Readings from Last 3 Encounters:  08/19/17 225 lb 3.2 oz (102.2 kg)  07/01/17 225 lb 9.6 oz (102.3 kg)  07/31/16 217 lb (98.4 kg)  Exercise: walking regularly  Colonoscopy: due 2021 Pap Smear: 01/11/17 Mammogram: 01/11/17  Dental: up to date Vision: up to date  Squamous cell carcinoma-    Review of Systems  Constitutional: Negative for unexpected weight change.  HENT: Negative for rhinorrhea.   Respiratory: Negative for cough.   Cardiovascular: Negative for chest pain and leg swelling.  Gastrointestinal: Negative for blood in stool, constipation, diarrhea and nausea.  Genitourinary: Negative for dysuria and frequency.       LMP >1 year  Musculoskeletal: Negative for arthralgias and myalgias.  Skin: Negative for rash.  Neurological: Negative for headaches.  Hematological: Negative for adenopathy.  Psychiatric/Behavioral:       Reports mood is good, denies anxiety   Past Medical History:  Diagnosis Date  . Alcoholism ([place])   . Anemia   . Anxiety   . Bipolar 1 disorder ([employer])   . Borderline personality disorder ([place])   . Cancer ([place])    left eye, cancerous lesion removed  . Depression   . GERD (gastroesophageal reflux disease)   . Hypothyroidism 06/08/2010  . IBS (irritable bowel syndrome)      Social History   Socioeconomic History  . Marital status: Married    Spouse name: Not on file  . Number of children: Not on file  . Years of education: Not on file  . Highest education level: Not on file  Occupational History  . Occupation: Hospital doctor  Social Needs  . Financial resource strain: Not on file  . Food insecurity:    Worry: Not on file    Inability: Not on file  . Transportation needs:    Medical: Not on file    Non-medical: Not on file    Tobacco Use  . Smoking status: Never Smoker  . Smokeless tobacco: Never Used  Substance and Sexual Activity  . Alcohol use: No  . Drug use: No  . Sexual activity: Yes    Birth control/protection: Surgical    Comment: husband with vasectomy  Lifestyle  . Physical activity:    Days per week: Not on file    Minutes per session: Not on file  . Stress: Not on file  Relationships  . Social connections:    Talks on phone: Not on file    Gets together: Not on file    Attends religious service: Not on file    Active member of club or organization: Not on file    Attends meetings of clubs or organizations: Not on file    Relationship status: Not on file  . Intimate partner violence:    Fear of current or ex partner: Not on file    Emotionally abused: Not on file    Physically abused: Not on file    Forced sexual activity: Not on file  Other Topics Concern  . 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 APPENDECTOMY    . HERNIA REPAIR     inguinal  Family History  Problem Relation Age of Onset  . Breast cancer Mother   . Prostate cancer Father   . Heart disease Father   . Colon cancer Neg Hx     No Known Allergies  Current Outpatient Medications on File Prior to Visit  Medication Sig Dispense Refill  . EQUETRO 200 MG CP12 12 hr capsule Take 400 mg by mouth 2 (two) times daily.  0  . levothyroxine (SYNTHROID, LEVOTHROID) 50 MCG tablet Take 1 tablet (50 mcg total) by mouth daily before breakfast. 90 tablet 1  . lurasidone (LATUDA) 40 MG TABS tablet Take 40 mg by mouth at bedtime.    . valACYclovir (VALTREX) 1000 MG tablet Take 0.5 tablets (500 mg total) by mouth daily. 45 tablet 1   No current facility-administered medications on file prior to visit.     Pulse 75   Temp 98.3 F (36.8 C) (Oral)   Resp 18   Ht 5\' 7"  (1.702 m)   Wt  225 lb 3.2 oz (102.2 kg)   SpO2 99%   BMI 35.27 kg/m       Objective:   Physical Exam Physical Exam  Constitutional: She is oriented to person, place, and time. She appears well-developed and well-nourished. No distress.  HENT:  Head: Normocephalic and atraumatic.  Right Ear: Tympanic membrane and ear canal normal.  Left Ear: Tympanic membrane and ear canal normal.  Mouth/Throat: Oropharynx is clear and moist.  Eyes: Pupils are equal, round, and reactive to light. No scleral icterus.  Neck: Normal range of motion. No thyromegaly present.  Cardiovascular: Normal rate and regular rhythm.   No murmur heard. Pulmonary/Chest: Effort normal and breath sounds normal. No respiratory distress. He has no wheezes. She has no rales. She exhibits no tenderness.  Abdominal: Soft. Bowel sounds are normal. She exhibits no distension and no mass. There is no tenderness. There is no rebound and no guarding.  Musculoskeletal: She exhibits no edema.  Lymphadenopathy:    She has no cervical adenopathy.  Neurological: She is alert and oriented to person, place, and time. She has normal patellar reflexes. She exhibits normal muscle tone. Coordination normal.  Skin: Skin is warm and dry.  Psychiatric: She has a normal mood and affect. Her behavior is normal. Judgment and thought content normal.  Breasts: Examined lying Right: Without masses, retractions, discharge or axillary adenopathy.  Left: Without masses, retractions, discharge or axillary adenopathy.   Assessment & Plan:          Assessment & Plan:  Preventative care- Encouraged her to continue healthy diet, exercise, weight loss.  EKG tracing is personally reviewed.  EKG notes NSR.  No acute changes. Pap/mammo/colo up to date.  Tdap and shingrix today.Obtain routine lab work.   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 Will see ocular oncologist. In the meantime- I have recommended referral to dermatology for a full skin  check.

## 2017-08-19 NOTE — Patient Instructions (Signed)
Continue healthy diet, exercise, weight loss. Complete lab work prior to leaving.

## 2017-08-19 NOTE — Addendum Note (Signed)
Addended by: Kelle Darting A on: 08/19/2017 11:36 AM   Modules accepted: Orders

## 2017-08-20 ENCOUNTER — Encounter: Payer: Self-pay | Admitting: Family

## 2017-08-20 ENCOUNTER — Telehealth: Payer: Self-pay | Admitting: *Deleted

## 2017-08-20 NOTE — Telephone Encounter (Signed)
Gwen or Maudie Mercury -- can you assist below request?  Thanks!

## 2017-08-20 NOTE — Telephone Encounter (Signed)
Copied from [place] [id number]. Topic: Referral - Medical Records >> Aug 20, 2017 11:07 AM Synthia Innocent wrote: Reason for CRM: Reason for CRM: [facility] requesting office notes for referral, Fax # [phone]

## 2017-08-21 ENCOUNTER — Telehealth: Payer: Self-pay | Admitting: *Deleted

## 2017-08-21 NOTE — Telephone Encounter (Signed)
Copied from [place] [id number]. Topic: General - Other >> Aug 20, 2017  3:44 PM Keene Breath wrote: Reason for CRM: Brayton Layman from [facility] called to request a pathology report of patient.  CB# [phone], Fax# [phone].

## 2017-08-21 NOTE — Telephone Encounter (Signed)
I thinks so, please confirm with patient that Dr Herbert Deaner did the initial biopsy.

## 2017-08-21 NOTE — Telephone Encounter (Signed)
refaxed to [facility] dermatology

## 2017-08-21 NOTE — Telephone Encounter (Signed)
Melissa -- I could not find the path report in pt's chart. Who do they need to contact? Dr Herbert Deaner?

## 2017-08-22 NOTE — Telephone Encounter (Signed)
Spoke with pt. She saw Dr Kathlen Mody at [facility] for biopsy / pathology. Notified Monica at [facility] dermatology.

## 2017-10-02 ENCOUNTER — Other Ambulatory Visit: Payer: Self-pay | Admitting: Family

## 2017-10-22 ENCOUNTER — Ambulatory Visit: Payer: Self-pay

## 2017-10-25 ENCOUNTER — Ambulatory Visit (INDEPENDENT_AMBULATORY_CARE_PROVIDER_SITE_OTHER): Payer: BLUE CROSS/BLUE SHIELD

## 2017-10-25 ENCOUNTER — Ambulatory Visit: Payer: Self-pay

## 2017-10-25 DIAGNOSIS — Z23 Encounter for immunization: Secondary | ICD-10-CM | POA: Diagnosis not present

## 2017-11-25 ENCOUNTER — Telehealth: Payer: Self-pay

## 2017-11-25 NOTE — Telephone Encounter (Signed)
Copied from [place] [id number]. Topic: Referral - Request for Referral >> Nov 22, 2017  3:04 PM Reyne Dumas L wrote: Has patient seen PCP for this complaint? yes *If NO, is insurance requiring patient see PCP for this issue before PCP can refer them? Referral for which specialty: nutrition Preferred provider/office: [facility] in [place] [facility] Reason for referral: high cholesterol and being overweight

## 2017-11-25 NOTE — Addendum Note (Signed)
Addended by: [name] on: 11/25/2017 04:05 PM   Modules accepted: Orders

## 2017-11-29 ENCOUNTER — Encounter: Payer: BLUE CROSS/BLUE SHIELD | Attending: Family | Admitting: Dietician

## 2017-11-29 ENCOUNTER — Telehealth: Payer: Self-pay | Admitting: Family

## 2017-11-29 ENCOUNTER — Encounter: Payer: Self-pay | Admitting: Dietician

## 2017-11-29 DIAGNOSIS — E785 Hyperlipidemia, unspecified: Secondary | ICD-10-CM

## 2017-11-29 DIAGNOSIS — Z713 Dietary counseling and surveillance: Secondary | ICD-10-CM | POA: Diagnosis not present

## 2017-11-29 NOTE — Progress Notes (Signed)
Medical Nutrition Therapy  Appt Start Time: 10:05am End Time: 10:45am  Assessment:  Primary concerns today: high cholesterol   Medical Hx: GERD, hypothyroidism, obesity, anxiety, bipolar disorder   Medications: Latuda, Equetro, Synthroid, Valtrex    DIETARY INTAKE  Usual eating pattern includes 3 meals and 1-2 snacks per day.   24-Hr Recall B: McDonald's egg McMuffin  Snk: none  L: fast food or [place] meat + vegetables  Snk: Enlightened ice cream bars  D: eat out ([employer])  Snk: dessert Beverages: Starbucks chai latte, water, unsweet tea    Pt states she dislikes sweet tea and very sweet drinks. Pt states she is an emotional eater. Pt states she is very busy with graduate school so she eats out a lot for convenience. Pt denies any recent issues with GERD.   GI / other notable symptoms: none currently (pt states she has a hx of IBS but currently no issues with it)   Average weekly physical activity: none since school started back in August. Pt is in graduate school at [employer].   Sleep: Pt states her husband would like her to get tested for sleep apnea. Pt states she is in bed for a long time at night but wakes up a lot and does not sleep deeply.   Estimated energy needs: 1600 calories 180 g carbohydrates 100 g protein 53 g fat  Nutritional Diagnosis: Altered nutrition-related laboratory values (Webb-2.2) related to hypercholesterolemia as evidenced by food- and nutrition-related knowledge deficit as well as elevated cholesterol (307 mg/dL), triglyceride (150 mg/dL), and LDL cholesterol (207 mg/dL) levels.    Intervention: Nutrition education (E-1) on heart-healthy nutrition therapy, general healthful/balanced eating, and physical activity as it relates to managing cholesterol levels.   Handouts given during visit include:  [facility] Heart Healthy Nutrition Therapy   Tips for Eating Out   MyPlate  Meal Ideas    Teaching Method Utilized: Visual & Auditory Demonstrated  degree of understanding via: Teach Back   Barriers to learning/adherence to lifestyle change: Busy schedule as a Insurance underwriter.  Pt did not ask many questions during the appointment but appeared to comprehend the nutrition education provided and verbalized understanding.   Monitoring/Evaluation:  Dietary intake, physical activity, and laboratory values (specifically lipid panel.)   Next Steps  Patient is to contact [facility] as needed with questions or for follow-up appointments as desired.

## 2017-11-29 NOTE — Telephone Encounter (Signed)
See mychart.  

## 2017-11-29 NOTE — Patient Instructions (Signed)
   Look into ways of incorporating physical activity throughout the week, such as visiting [employer] recreation center. Goal is to reach 150 minutes of physical activity per week.   Balance meals (especially when eating out) using the MyPlate handout as a guide.

## 2017-12-25 ENCOUNTER — Other Ambulatory Visit: Payer: Self-pay | Admitting: Family

## 2018-01-03 ENCOUNTER — Encounter: Payer: Self-pay | Admitting: Family

## 2018-01-03 ENCOUNTER — Ambulatory Visit (INDEPENDENT_AMBULATORY_CARE_PROVIDER_SITE_OTHER): Payer: BLUE CROSS/BLUE SHIELD | Admitting: Family

## 2018-01-03 VITALS — BP 117/74 | HR 78 | Temp 98.4°F | Ht 67.0 in | Wt 227.4 lb

## 2018-01-03 DIAGNOSIS — R0683 Snoring: Secondary | ICD-10-CM

## 2018-01-03 DIAGNOSIS — R0681 Apnea, not elsewhere classified: Secondary | ICD-10-CM | POA: Diagnosis not present

## 2018-01-03 NOTE — Progress Notes (Signed)
Subjective:    Patient ID: Diana Decker, female    DOB: 16-Feb-1965, 52 y.o.   MRN: [id number]  HPI  Patient is a 52 yr old female who presents today to discuss possibility of a sleep study. She tells me that her husband reports that she snores loudly and wakes up gasping.  Wakes up feeling un- rested.  Does not fall asleep during the day.    Review of Systems See HPI  Past Medical History:  Diagnosis Date  . Alcoholism ([place])   . Anemia   . Anxiety   . Bipolar 1 disorder ([place])   . Borderline personality disorder ([place])   . Depression   . GERD (gastroesophageal reflux disease)   . Hyperlipidemia 08/19/2017  . Hypothyroidism 06/08/2010  . IBS (irritable bowel syndrome)   . 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 Marital status: Married    Spouse name: Not on file  . Number of children: Not on file  . Years of education: Not on file  . Highest education level: Not on file  Occupational History  . Occupation: Hospital doctor  Social Needs  . Financial resource strain: Not on file  . Food insecurity:    Worry: Never true    Inability: Never true  . Transportation needs:    Medical: Not on file    Non-medical: Not on file  Tobacco Use  . Smoking status: Never Smoker  . Smokeless tobacco: Never Used  Substance and Sexual Activity  . Alcohol use: No  . Drug use: No  . Sexual activity: Yes    Birth control/protection: Surgical    Comment: husband with vasectomy  Lifestyle  . Physical activity:    Days per week: Not on file    Minutes per session: Not on file  . Stress: Not on file  Relationships  . Social connections:    Talks on phone: Not on file    Gets together: Not on file    Attends religious service: Not on file    Active member of club or organization: Not on file    Attends meetings of clubs or organizations: Not on file    Relationship status: Not on file  . Intimate partner  violence:    Fear of current or ex partner: Not on file    Emotionally abused: Not on file    Physically abused: Not on file    Forced sexual activity: Not on file  Other Topics Concern  . 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 APPENDECTOMY    . HERNIA REPAIR     inguinal    Family History  Problem Relation Age of Onset  . Breast cancer Mother   . Prostate cancer Father   . Heart disease Father   . Colon cancer Neg Hx     No Known Allergies  Current Outpatient Medications on File Prior to Visit  Medication Sig Dispense Refill  . EQUETRO 200 MG CP12 12 hr capsule Take 400 mg by mouth 2 (two) times daily.  0  . levothyroxine (SYNTHROID, LEVOTHROID) 50 MCG tablet TAKE 1 TABLET (50 MCG TOTAL) BY MOUTH DAILY BEFORE BREAKFAST. 90 tablet 1  . lurasidone (LATUDA) 40 MG TABS tablet Take 40 mg by mouth  at bedtime.    . valACYclovir (VALTREX) 1000 MG tablet TAKE 1/2 TABLET BY MOUTH EVERY DAY 45 tablet 1   No current facility-administered medications on file prior to visit.     BP 117/74 (BP Location: Right Arm, Patient Position: Sitting, Cuff Size: Large)   Pulse 78   Temp 98.4 F (36.9 C) (Oral)   Ht 5\' 7"  (1.702 m)   Wt 227 lb 6.4 oz (103.1 kg)   SpO2 99%   BMI 35.62 kg/m       Objective:   Physical Exam  Constitutional: She is oriented to person, place, and time. She appears well-developed and well-nourished.  Cardiovascular: Normal rate, regular rhythm and normal heart sounds.  No murmur heard. Pulmonary/Chest: Effort normal and breath sounds normal. No respiratory distress. She has no wheezes.  Neurological: She is alert and oriented to person, place, and time.  Skin: Skin is warm and dry.  Psychiatric: She has a normal mood and affect. Her behavior is normal. Judgment and thought content normal.          Assessment &  Plan:  Witnessed apnea/snoring- given report and habitus, strongly suspect OSA. Will arrange home sleep study for further evaluation.

## 2018-01-03 NOTE — Patient Instructions (Signed)
You should be contacted about scheduling your home sleep study. Please call  pulmonary if you have not heard back from them in 1 week. [phone]

## 2018-02-21 ENCOUNTER — Ambulatory Visit: Payer: Self-pay | Admitting: Family

## 2018-02-28 ENCOUNTER — Ambulatory Visit (INDEPENDENT_AMBULATORY_CARE_PROVIDER_SITE_OTHER): Payer: BLUE CROSS/BLUE SHIELD | Admitting: Family

## 2018-02-28 ENCOUNTER — Encounter: Payer: Self-pay | Admitting: Family

## 2018-02-28 VITALS — BP 121/82 | HR 72 | Temp 98.6°F | Resp 16 | Ht 67.0 in | Wt 227.0 lb

## 2018-02-28 DIAGNOSIS — E039 Hypothyroidism, unspecified: Secondary | ICD-10-CM

## 2018-02-28 DIAGNOSIS — E785 Hyperlipidemia, unspecified: Secondary | ICD-10-CM

## 2018-02-28 LAB — LDL CHOLESTEROL, DIRECT: Direct LDL: 156 mg/dL

## 2018-02-28 LAB — LIPID PANEL
Cholesterol: 252 mg/dL — ABNORMAL HIGH (ref 0–200)
HDL: 61.4 mg/dL (ref >39.00)
NonHDL: 190.32
Total CHOL/HDL Ratio: 4
Triglycerides: 220 mg/dL — ABNORMAL HIGH (ref 0.0–149.0)
VLDL: 44 mg/dL — ABNORMAL HIGH (ref 0.0–40.0)

## 2018-02-28 LAB — TSH: TSH: 3.9 u[IU]/mL (ref 0.35–4.50)

## 2018-02-28 MED ORDER — LEVOTHYROXINE SODIUM 50 MCG PO TABS: 50.0000 ug | ORAL_TABLET | Freq: Every day | ORAL | 1 refills | Status: DC

## 2018-02-28 NOTE — Patient Instructions (Signed)
Please complete lab work prior to leaving.   

## 2018-02-28 NOTE — Progress Notes (Signed)
Subjective:    Patient ID: Diana Decker, female    DOB: [birth year], 53 y.o.   MRN: [id number]  HPI  Patient is a 53 yr old female who presents today for follow up.  Hypothyroid- maintained on synthroid. Reports feeling well on this dose. Denies fatigue. Lab Results  Component Value Date   TSH 2.98 07/01/2017    Lab Results  Component Value Date   CHOL 307 (H) 08/19/2017   HDL 69.70 08/19/2017   LDLCALC 207 (H) 08/19/2017   TRIG 150.0 (H) 08/19/2017   CHOLHDL 4 08/19/2017     Review of Systems See HPI  Past Medical History:  Diagnosis Date  . Alcoholism ([place])   . Anemia   . Anxiety   . Bipolar 1 disorder ([place])   . Borderline personality disorder ([place])   . Depression   . GERD (gastroesophageal reflux disease)   . Hyperlipidemia 08/19/2017  . Hypothyroidism 06/08/2010  . IBS (irritable bowel syndrome)   . 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 Marital status: Married    Spouse name: Not on file  . Number of children: Not on file  . Years of education: Not on file  . Highest education level: Not on file  Occupational History  . Occupation: Hospital doctor  Social Needs  . Financial resource strain: Not on file  . Food insecurity:    Worry: Never true    Inability: Never true  . Transportation needs:    Medical: Not on file    Non-medical: Not on file  Tobacco Use  . Smoking status: Never Smoker  . Smokeless tobacco: Never Used  Substance and Sexual Activity  . Alcohol use: No  . Drug use: No  . Sexual activity: Yes    Birth control/protection: Surgical    Comment: husband with vasectomy  Lifestyle  . Physical activity:    Days per week: Not on file    Minutes per session: Not on file  . Stress: Not on file  Relationships  . Social connections:    Talks on phone: Not on file    Gets together: Not on file    Attends religious service: Not on file    Active member of  club or organization: Not on file    Attends meetings of clubs or organizations: Not on file    Relationship status: Not on file  . Intimate partner violence:    Fear of current or ex partner: Not on file    Emotionally abused: Not on file    Physically abused: Not on file    Forced sexual activity: Not on file  Other Topics Concern  . 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 APPENDECTOMY    . HERNIA REPAIR     inguinal    Family History  Problem Relation Age of Onset  . Breast cancer Mother   . Prostate cancer Father   . Heart disease Father   . Colon cancer Neg Hx     No Known Allergies  Current Outpatient Medications on File Prior to Visit  Medication Sig Dispense Refill  . EQUETRO 200 MG CP12 12 hr capsule Take 400 mg by mouth 2 (two) times daily.  0  . levothyroxine (SYNTHROID, LEVOTHROID) 50  MCG tablet TAKE 1 TABLET (50 MCG TOTAL) BY MOUTH DAILY BEFORE BREAKFAST. 90 tablet 1  . lurasidone (LATUDA) 40 MG TABS tablet Take 40 mg by mouth at bedtime.    . valACYclovir (VALTREX) 1000 MG tablet TAKE 1/2 TABLET BY MOUTH EVERY DAY 45 tablet 1   No current facility-administered medications on file prior to visit.     BP 121/82 (BP Location: Right Arm, Patient Position: Sitting, Cuff Size: Large)   Pulse 72   Temp 98.6 F (37 C) (Oral)   Resp 16   Ht 5\' 7"  (1.702 m)   Wt 227 lb (103 kg)   SpO2 98%   BMI 35.55 kg/m       Objective:   Physical Exam Constitutional:      Appearance: She is well-developed.  Neck:     Musculoskeletal: Neck supple.     Thyroid: No thyromegaly.  Cardiovascular:     Rate and Rhythm: Normal rate and regular rhythm.     Heart sounds: Normal heart sounds. No murmur.  Pulmonary:     Effort: Pulmonary effort is normal. No respiratory distress.     Breath sounds: Normal breath sounds. No wheezing.    Skin:    General: Skin is warm and dry.  Neurological:     Mental Status: She is alert and oriented to person, place, and time.  Psychiatric:        Behavior: Behavior normal.        Thought Content: Thought content normal.        Judgment: Judgment normal.           Assessment & Plan:  Hypothryoid- clinically stable on synthroid. Continue same, obtain follow up TSH.  Hyperlipidemia- check FLP.

## 2018-03-07 ENCOUNTER — Encounter: Payer: Self-pay | Admitting: Psychiatry

## 2018-03-07 ENCOUNTER — Ambulatory Visit (INDEPENDENT_AMBULATORY_CARE_PROVIDER_SITE_OTHER): Payer: BLUE CROSS/BLUE SHIELD | Admitting: Psychiatry

## 2018-03-07 VITALS — BP 130/91 | HR 72

## 2018-03-07 DIAGNOSIS — Z79899 Other long term (current) drug therapy: Secondary | ICD-10-CM

## 2018-03-07 DIAGNOSIS — F3176 Bipolar disorder, in full remission, most recent episode depressed: Secondary | ICD-10-CM

## 2018-03-07 MED ORDER — EQUETRO 200 MG PO CP12: ORAL_CAPSULE | ORAL | 1 refills | Status: DC

## 2018-03-07 MED ORDER — LURASIDONE HCL 120 MG PO TABS: ORAL_TABLET | ORAL | 1 refills | Status: DC

## 2018-03-07 NOTE — Progress Notes (Signed)
Diana Decker [id number] 1965-09-22 53 y.o.  Subjective:   Patient ID:  Diana Decker is a 53 y.o. (DOB 12/09/1965) female.  Chief Complaint:  Chief Complaint  Patient presents with  . Follow-up    h/o mood instability    HPI Diana Decker presents to the office today for follow-up of mood and anxiety. She reports that her mood has been "pretty good." She reports having some mild depression in December with some decrease in energy and motivation and was sleeping more. Denies having any SI at that time. Denies any recent manic s/s. Denies impulsive or risky behaviors. Has joined OA a few weeks ago to help with overeating. Reports that she has compulsions to eat when stressed and emotional. Denies excessive appetite like she had on Abilify. Reports occasional binging. She reports that her sleep has been "pretty good" and is periodically awakened due to gynecological issue. Reports some mild anxiety with school and denies any severe anxiety or panic s/s. Reports that her energy and motivation have improved this month. She reports that her concentration has been "ok" and is reading daily for school.   She reports rare vague, fleeting suicidal thoughts- "but it is no where near like it used to be." Denies any suicidal plan or intent.   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   Had to have surgery for squamous cell carcinoma.   Past Psychiatric Medication Trials: Latuda- Had restless legs on higher doses.  Good response to 60 mg dose Equetro-effective for mood stabilization without visual changes Carbamazepine (generic)-had vision changes and drowsiness Abilify-effective.  Experience compulsive eating and spending.  Weight gain Saphris Depakote-some weight gain Lithium-took briefly Serzone Trazodone Lamictal-caused insomnia Melatonin   Review of Systems:  Review of Systems  Eyes:       Had squamous cell cancer removed from eye lid and  then plastic surgery on left lower eye lid.     Medications: I have reviewed the patient's current medications.  Current Outpatient Medications  Medication Sig Dispense Refill  . EQUETRO 200 MG CP12 12 hr capsule Take 2 capsules (400 mg total) by mouth every morning AND 1 capsule (200 mg total) at bedtime. 270 each 1  . levothyroxine (SYNTHROID, LEVOTHROID) 50 MCG tablet Take 1 tablet (50 mcg total) by mouth daily before breakfast. 90 tablet 1  . Lurasidone HCl (LATUDA) 120 MG TABS Take 1/2-1 tab po q evening with a meal 90 tablet 1  . Multiple Vitamin (MULTIVITAMIN) tablet Take 1 tablet by mouth daily.    . Omega-3 Fatty Acids (FISH OIL PO) Take by mouth.    . valACYclovir (VALTREX) 1000 MG tablet TAKE 1/2 TABLET BY MOUTH EVERY DAY 45 tablet 1   No current facility-administered medications for this visit.     Medication Side Effects: None and Other: Possible mild RLS.  Allergies: No Known Allergies  Past Medical History:  Diagnosis Date  . Alcoholism ([place])   . Anemia   . Anxiety   . Bipolar 1 disorder ([place])   . Borderline personality disorder ([place])   . Depression   . GERD (gastroesophageal reflux disease)   . Hyperlipidemia 08/19/2017  . Hypothyroidism 06/08/2010  . IBS (irritable bowel syndrome)   . 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 Breast cancer Mother   . Depression Mother   . Prostate cancer  Father   . Heart disease Father   . Alcohol abuse Father   . Depression Father   . Anxiety disorder Father   . Depression Maternal Uncle   . Alcohol abuse Maternal Uncle   . Suicidality Maternal Uncle   . Alcohol abuse Half-Sister   . Bipolar disorder Half-Sister   . Colon cancer Neg Hx     Social History   Socioeconomic History  . Marital status: Married    Spouse name: Not on file  . Number of children: Not on file  . Years of education: Not on file  . Highest education level:  Not on file  Occupational History  . Occupation: Hospital doctor  Social Needs  . Financial resource strain: Not on file  . Food insecurity:    Worry: Never true    Inability: Never true  . Transportation needs:    Medical: Not on file    Non-medical: Not on file  Tobacco Use  . Smoking status: Never Smoker  . Smokeless tobacco: Never Used  Substance and Sexual Activity  . Alcohol use: No  . Drug use: No  . Sexual activity: Yes    Birth control/protection: Surgical    Comment: husband with vasectomy  Lifestyle  . Physical activity:    Days per week: Not on file    Minutes per session: Not on file  . Stress: Not on file  Relationships  . Social connections:    Talks on phone: Not on file    Gets together: Not on file    Attends religious service: Not on file    Active member of club or organization: Not on file    Attends meetings of clubs or organizations: Not on file    Relationship status: Not on file  . Intimate partner violence:    Fear of current or ex partner: Not on file    Emotionally abused: Not on file    Physically abused: Not on file    Forced sexual activity: Not on file  Other Topics Concern  . 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   Please see review of systems for further details on the patient's review from today.   Objective:   Physical Exam:  BP (!) 130/91   Pulse 72   Physical Exam Constitutional:      General: She is not in acute distress.    Appearance: She is well-developed.  Musculoskeletal:        General: No deformity.  Neurological:     Mental Status: She is alert and oriented to person, place, and time.     Coordination: Coordination normal.  Psychiatric:        Attention and Perception: Attention and perception normal. She  does not perceive auditory or visual hallucinations.        Mood and Affect: Mood normal. Mood is not anxious or depressed. Affect is not labile, blunt, angry or inappropriate.        Speech: Speech normal.        Behavior: Behavior normal.        Thought Content: Thought content normal. Thought content does not include homicidal or suicidal ideation. Thought content does not include homicidal or suicidal plan.        Cognition and Memory: Cognition and memory normal.  Judgment: Judgment normal.     Comments: Insight intact. No delusions.      Lab Review:     Component Value Date/Time   NA 139 08/19/2017 1114   K 4.8 08/19/2017 1114   CL 102 08/19/2017 1114   CO2 31 08/19/2017 1114   GLUCOSE 100 (H) 08/19/2017 1114   BUN 10 08/19/2017 1114   CREATININE 0.65 08/19/2017 1114   CREATININE 0.74 02/09/2015 0702   CALCIUM 9.9 08/19/2017 1114   PROT 7.5 08/19/2017 1114   ALBUMIN 4.4 08/19/2017 1114   AST 14 08/19/2017 1114   ALT 17 08/19/2017 1114   ALKPHOS 102 08/19/2017 1114   BILITOT 0.3 08/19/2017 1114   GFRNONAA >90 07/11/2013 1511   GFRAA >90 07/11/2013 1511       Component Value Date/Time   WBC 4.7 08/19/2017 1114   RBC 4.10 08/19/2017 1114   HGB 13.1 08/19/2017 1114   HCT 39.2 08/19/2017 1114   PLT 299.0 08/19/2017 1114   MCV 95.6 08/19/2017 1114   MCH 32.0 02/09/2015 0702   MCHC 33.4 08/19/2017 1114   RDW 13.4 08/19/2017 1114   LYMPHSABS 2.0 08/19/2017 1114   MONOABS 0.4 08/19/2017 1114   EOSABS 0.3 08/19/2017 1114   BASOSABS 0.0 08/19/2017 1114    No results found for: POCLITH, LITHIUM   No results found for: PHENYTOIN, PHENOBARB, VALPROATE, CBMZ   .res Assessment: Plan:   Will order carbamazepine level, CBC and hepatic panel to evaluate for any potential adverse effects with Equetro. Continue Equetro 400 mg p.o. every morning and 200 mg p.o. nightly for mood stabilization since mood signs and symptoms are currently well controlled. Continue Latuda  120 mg 1/2-1 tab p.o. daily with evening meal for mood stabilization since mood signs and symptoms are currently well controlled.  High risk medication use - Plan: Carbamazepine level, total, CBC with Differential/Platelet, Hepatic function panel  Bipolar disorder, in full remission, most recent episode depressed ([place]) - Plan: Lurasidone HCl (LATUDA) 120 MG TABS, EQUETRO 200 MG CP12 12 hr capsule  Please see After Visit Summary for patient specific instructions.  Future Appointments  Date Time Provider [place]  09/05/2018  1:30 PM Thayer Headings, PMHNP [facility] None    Orders Placed This Encounter  Procedures  . Carbamazepine level, total  . CBC with Differential/Platelet  . Hepatic function panel      -------------------------------

## 2018-03-15 LAB — CBC WITH DIFFERENTIAL/PLATELET
Absolute Monocytes: 310 cells/uL (ref 200–950)
Basophils Absolute: 52 cells/uL (ref 0–200)
Basophils Relative: 1.2 %
Eosinophils Absolute: 288 cells/uL (ref 15–500)
Eosinophils Relative: 6.7 %
HCT: 38.5 % (ref 35.0–45.0)
Hemoglobin: 13.1 g/dL (ref 11.7–15.5)
Lymphs Abs: 1548 cells/uL (ref 850–3900)
MCH: 31.9 pg (ref 27.0–33.0)
MCHC: 34 g/dL (ref 32.0–36.0)
MCV: 93.7 fL (ref 80.0–100.0)
MPV: 11.4 fL (ref 7.5–12.5)
Monocytes Relative: 7.2 %
Neutro Abs: 2103 cells/uL (ref 1500–7800)
Neutrophils Relative %: 48.9 %
Platelets: 286 10*3/uL (ref 140–400)
RBC: 4.11 10*6/uL (ref 3.80–5.10)
RDW: 12.3 % (ref 11.0–15.0)
Total Lymphocyte: 36 %
WBC: 4.3 10*3/uL (ref 3.8–10.8)

## 2018-03-15 LAB — HEPATIC FUNCTION PANEL
AG Ratio: 1.4 (calc) (ref 1.0–2.5)
ALT: 16 U/L (ref 6–29)
AST: 15 U/L (ref 10–35)
Albumin: 4.4 g/dL (ref 3.6–5.1)
Alkaline phosphatase (APISO): 95 U/L (ref 33–130)
Bilirubin, Direct: 0.1 mg/dL (ref 0.0–0.2)
Globulin: 3.1 g/dL (calc) (ref 1.9–3.7)
Indirect Bilirubin: 0.2 mg/dL (calc) (ref 0.2–1.2)
Total Bilirubin: 0.3 mg/dL (ref 0.2–1.2)
Total Protein: 7.5 g/dL (ref 6.1–8.1)

## 2018-03-15 LAB — CARBAMAZEPINE LEVEL, TOTAL: Carbamazepine Lvl: 6.1 mg/L (ref 4.0–12.0)

## 2018-05-05 ENCOUNTER — Telehealth: Payer: Self-pay | Admitting: Psychiatry

## 2018-05-05 DIAGNOSIS — F5105 Insomnia due to other mental disorder: Secondary | ICD-10-CM

## 2018-05-05 DIAGNOSIS — F3176 Bipolar disorder, in full remission, most recent episode depressed: Secondary | ICD-10-CM

## 2018-05-05 DIAGNOSIS — F99 Mental disorder, not otherwise specified: Principal | ICD-10-CM

## 2018-05-05 MED ORDER — EQUETRO 200 MG PO CP12: 400.0000 mg | ORAL_CAPSULE | Freq: Two times a day (BID) | ORAL | 2 refills | Status: DC

## 2018-05-05 NOTE — Telephone Encounter (Signed)
She reports that she has been having increased difficulty with sleep for the last week and attributes this to anxiety with COVID 19 and effects on school and work. She reports that she woke up at 3:30 am and was unable to sleep. Asks about increase in Equetro to 400 mg po BID since in the past it caused her to sleep excessively in the past. She reports that that is likely "more fearful than I need to be" about COVID 19. Noticed some obsessive thoughts yesterday when trying to find items that her mother needed. She reports that she has been more easily agitated and irritated by others. Denies risky or impulsive behavior.   Agree with plan to increase Equetro to 400 mg po BID for mood stabilization and insomnia.  Patient advised to contact office with any questions, adverse effects, or acute worsening in signs and symptoms.

## 2018-05-05 NOTE — Addendum Note (Signed)
Addended by: Sharyl Nimrod on: 05/05/2018 05:09 PM   Modules accepted: Orders

## 2018-05-05 NOTE — Telephone Encounter (Signed)
Pt called has questions about meds. Please contact at [phone]

## 2018-05-09 ENCOUNTER — Other Ambulatory Visit: Payer: Self-pay | Admitting: Psychiatry

## 2018-05-09 DIAGNOSIS — F5105 Insomnia due to other mental disorder: Secondary | ICD-10-CM

## 2018-05-09 DIAGNOSIS — F99 Mental disorder, not otherwise specified: Secondary | ICD-10-CM

## 2018-05-09 DIAGNOSIS — F3176 Bipolar disorder, in full remission, most recent episode depressed: Secondary | ICD-10-CM

## 2018-07-03 ENCOUNTER — Other Ambulatory Visit: Payer: Self-pay

## 2018-07-03 ENCOUNTER — Ambulatory Visit (INDEPENDENT_AMBULATORY_CARE_PROVIDER_SITE_OTHER): Payer: BLUE CROSS/BLUE SHIELD | Admitting: Podiatry

## 2018-07-03 ENCOUNTER — Ambulatory Visit (INDEPENDENT_AMBULATORY_CARE_PROVIDER_SITE_OTHER): Payer: BLUE CROSS/BLUE SHIELD

## 2018-07-03 ENCOUNTER — Encounter: Payer: Self-pay | Admitting: Podiatry

## 2018-07-03 VITALS — BP 110/75 | HR 74 | Temp 97.7°F

## 2018-07-03 DIAGNOSIS — M722 Plantar fascial fibromatosis: Secondary | ICD-10-CM

## 2018-07-03 MED ORDER — TRIAMCINOLONE ACETONIDE 10 MG/ML IJ SUSP
10.0000 mg | Freq: Once | INTRAMUSCULAR | Status: AC
  Administered 2018-07-03: 14:00:00 10 mg

## 2018-07-03 NOTE — Progress Notes (Signed)
Subjective:   Patient ID: Diana Decker, female   DOB: 53 y.o.   MRN: [id number]   HPI Patient presents stating she is had a lot of pain in the bottom of her heel and states it is worse after the day and has had history of fasciitis.  Patient states that sharp shooting type pain and patient does not smoke and likes to be active   Review of Systems  All other systems reviewed and are negative.       Objective:  Physical Exam Vitals signs and nursing note reviewed.  Constitutional:      Appearance: She is well-developed.  Pulmonary:     Effort: Pulmonary effort is normal.  Musculoskeletal: Normal range of motion.  Skin:    General: Skin is warm.  Neurological:     Mental Status: She is alert.     Neurovascular status found to be intact muscle strength adequate range of motion within normal limits with exquisite discomfort plantar aspect right heel at the insertional point tendon calcaneus with inflammation fluid around the medial band noted mild Achilles tendon pain noted but not to the same degree     Assessment:  Acute plantar fasciitis right with possibility for mild Achilles tendinitis which may be compensatory     Plan:  H&P x-ray reviewed and today I did sterile prep and injected the fascia 3 mg Kenalog 5 mg Xylocaine at the insertion and applied fascial brace with instructions on usage.  I discussed supportive shoes physical therapy and dispense the fascial brace to the patient.  Reappoint for [place] to recheck  X-ray indicates there is spur formation with no indications of stress fracture arthritis

## 2018-07-03 NOTE — Patient Instructions (Signed)

## 2018-07-17 ENCOUNTER — Ambulatory Visit (INDEPENDENT_AMBULATORY_CARE_PROVIDER_SITE_OTHER): Payer: BLUE CROSS/BLUE SHIELD | Admitting: Podiatry

## 2018-07-17 ENCOUNTER — Other Ambulatory Visit: Payer: Self-pay

## 2018-07-17 ENCOUNTER — Encounter: Payer: Self-pay | Admitting: Podiatry

## 2018-07-17 VITALS — Temp 97.5°F

## 2018-07-17 DIAGNOSIS — M722 Plantar fascial fibromatosis: Secondary | ICD-10-CM | POA: Diagnosis not present

## 2018-07-17 MED ORDER — TRIAMCINOLONE ACETONIDE 10 MG/ML IJ SUSP
10.0000 mg | Freq: Once | INTRAMUSCULAR | Status: AC
  Administered 2018-07-17: 10 mg

## 2018-07-17 NOTE — Progress Notes (Signed)
Subjective:   Patient ID: Diana Decker, female   DOB: 53 y.o.   MRN: [id number]   HPI Patient states that she is improved but still has an area of quite a bit of discomfort plantar aspect right heel stating that it is worse if she tries to do activity   ROS      Objective:  Physical Exam  Neurovascular status intact with inflammation of the plantar fascial right which has improved but is still tender with palpation     Assessment:  Continuation of plantar fasciitis right     Plan:  Advised patient on continued anti-inflammatory and physical therapy along with shoe gear modifications and reinjected the plantar fascia right 3 mg Kenalog 5 mg Xylocaine after sterile prep.  If symptoms persist will consider other treatment options signed visit

## 2018-08-03 ENCOUNTER — Other Ambulatory Visit: Payer: Self-pay | Admitting: Psychiatry

## 2018-08-03 DIAGNOSIS — F3176 Bipolar disorder, in full remission, most recent episode depressed: Secondary | ICD-10-CM

## 2018-08-03 DIAGNOSIS — F5105 Insomnia due to other mental disorder: Secondary | ICD-10-CM

## 2018-08-28 ENCOUNTER — Other Ambulatory Visit: Payer: Self-pay

## 2018-08-28 ENCOUNTER — Ambulatory Visit (INDEPENDENT_AMBULATORY_CARE_PROVIDER_SITE_OTHER): Payer: BC Managed Care – PPO | Admitting: Podiatry

## 2018-08-28 ENCOUNTER — Encounter: Payer: Self-pay | Admitting: Podiatry

## 2018-08-28 VITALS — Temp 97.2°F

## 2018-08-28 DIAGNOSIS — M722 Plantar fascial fibromatosis: Secondary | ICD-10-CM

## 2018-08-28 DIAGNOSIS — B009 Herpesviral infection, unspecified: Secondary | ICD-10-CM | POA: Insufficient documentation

## 2018-08-28 NOTE — Progress Notes (Signed)
Subjective:   Patient ID: Martyn Malay, female   DOB: 53 y.o.   MRN: [id number]   HPI Patient states my heel has started to hurt me again and I was doing better but I walk 7 miles and I really flared it up   ROS      Objective:  Physical Exam  Neurovascular status intact with exquisite discomfort plantar aspect right heel at the insertional point tendon calcaneus with inflammation fluid around the medial band     Assessment:  Acute reoccurrence of fasciitis-like symptoms right with inflammation fluid medial band     Plan:  H&P and went ahead today and injected the right plantar fascia 3 mg Kenalog 500 Xylocaine into the area of maximum inflammation.  I then went ahead and advised on physical therapy support therapy and did scan for customized orthotic devices to try to reduce the chronic stress on her fascia and heel.  Patient will be seen back when ready and ped orthotist did do this procedure for [place]

## 2018-09-05 ENCOUNTER — Ambulatory Visit: Payer: BLUE CROSS/BLUE SHIELD | Admitting: Psychiatry

## 2018-09-07 IMAGING — US US THYROID
1 series · 14 of 25 positions shown · non-contrast
Comparison: None.

CLINICAL DATA: Palpable abnormality. 52-year-old female with
thyromegaly on physical exam

EXAM:
THYROID ULTRASOUND
TECHNIQUE: Ultrasound examination of the thyroid gland and adjacent soft
tissues was performed.

[Series 1: us thyroid · 0.07mm/px · 14 of 55 slices shown]
[im 1/55]
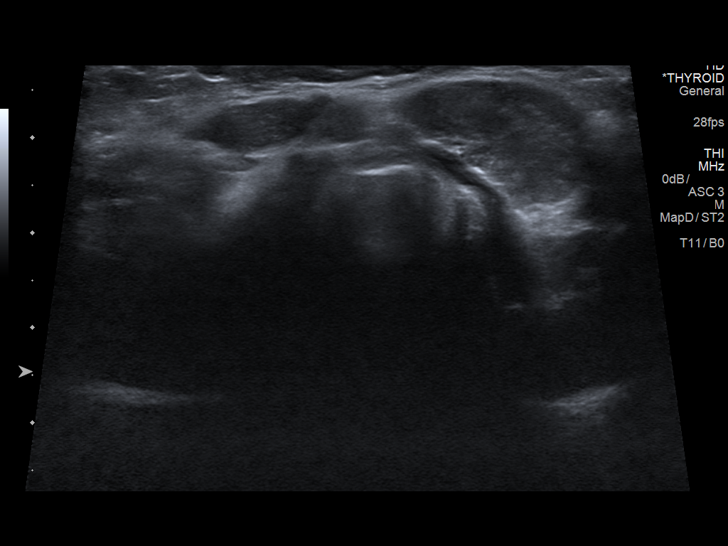
[im 5/55]
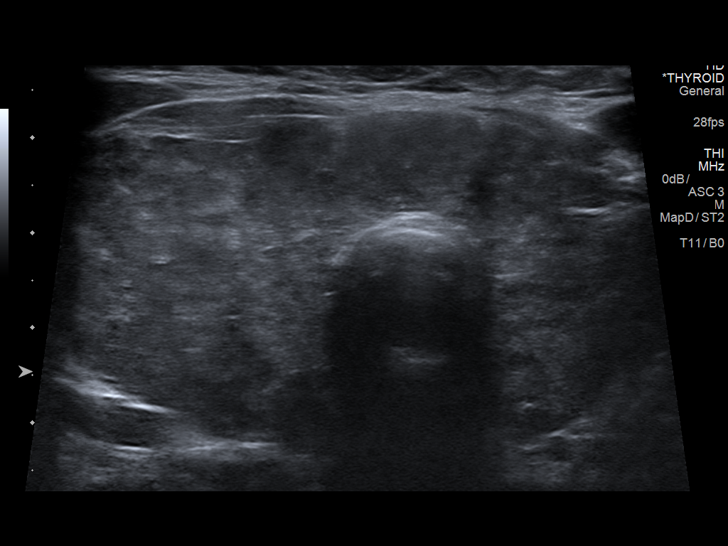
[im 10/55]
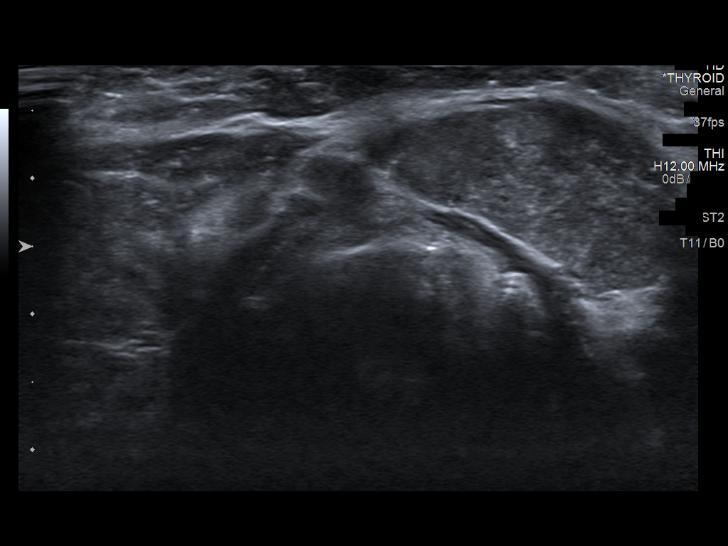
[im 14/55]
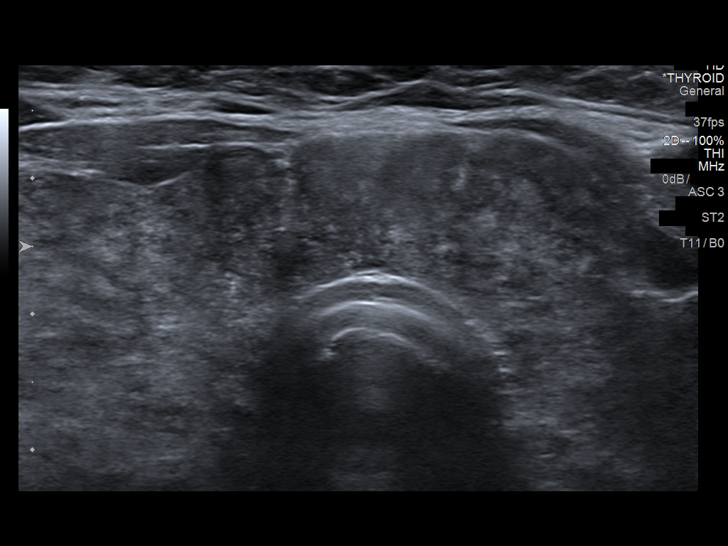
[im 19/55]
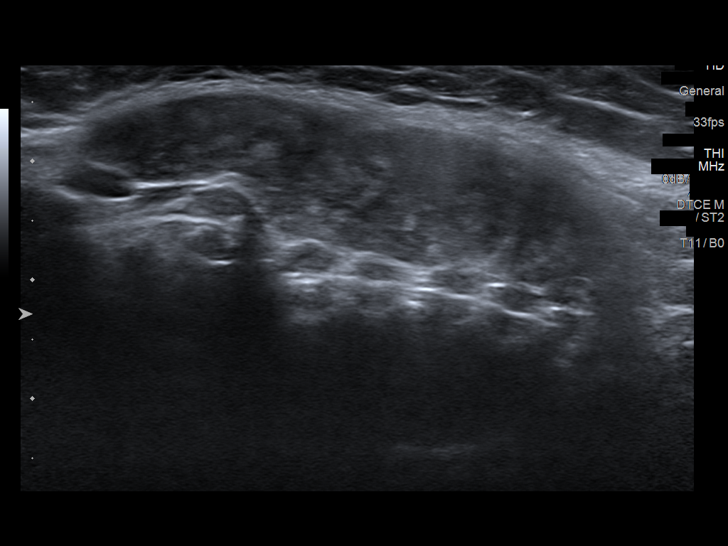
[im 21/55]
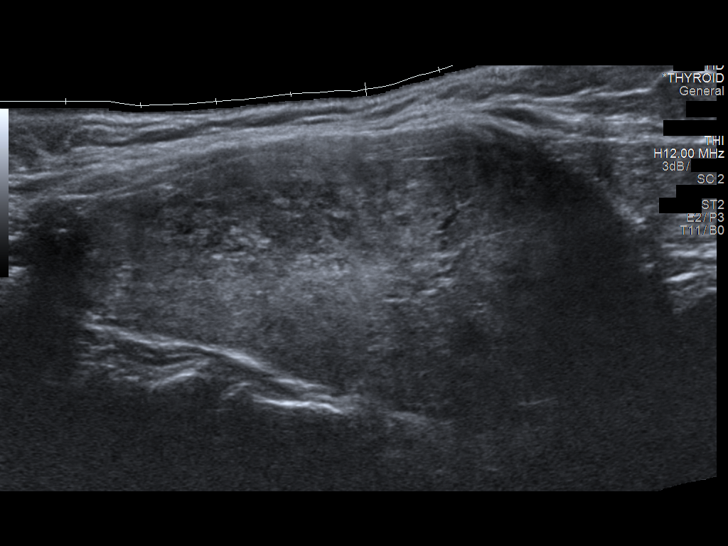
[im 25/55]
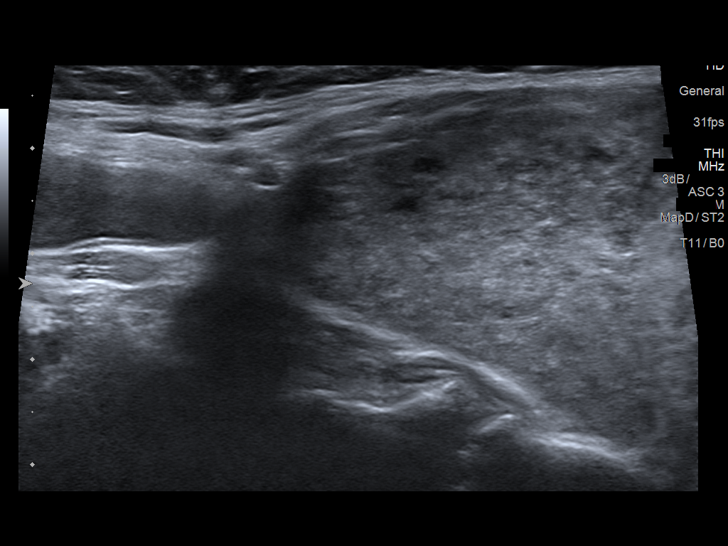
[im 30/55]
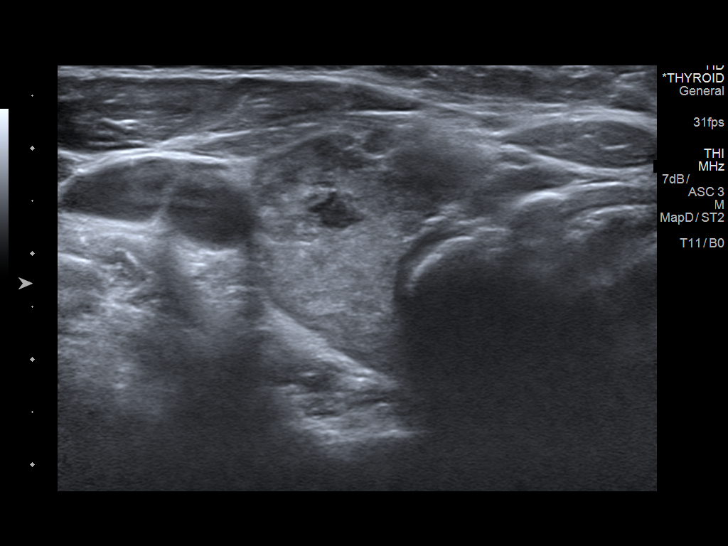
[im 34/55]
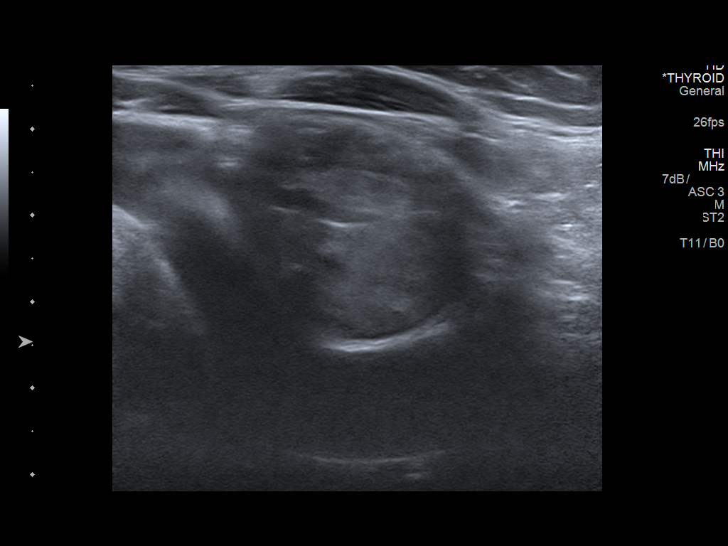
[im 37/55]
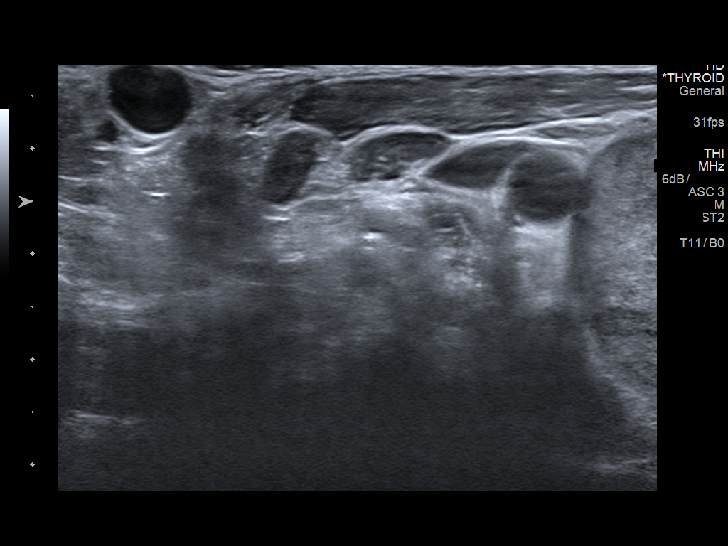
[im 41/55]
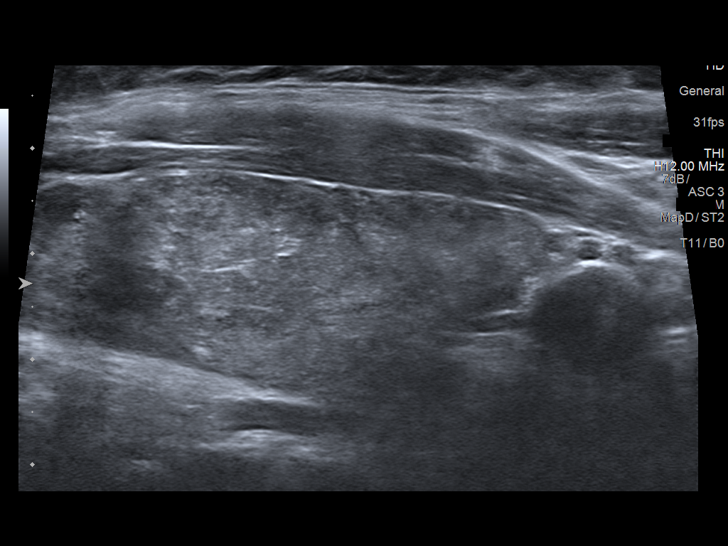
[im 46/55]
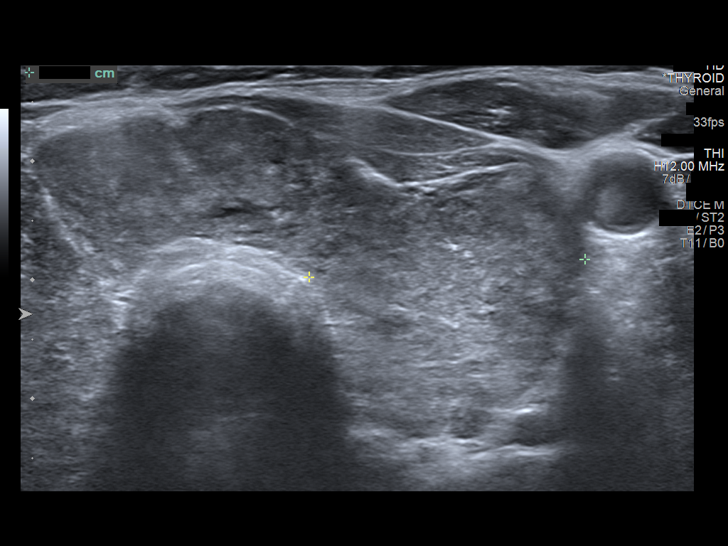
[im 50/55]
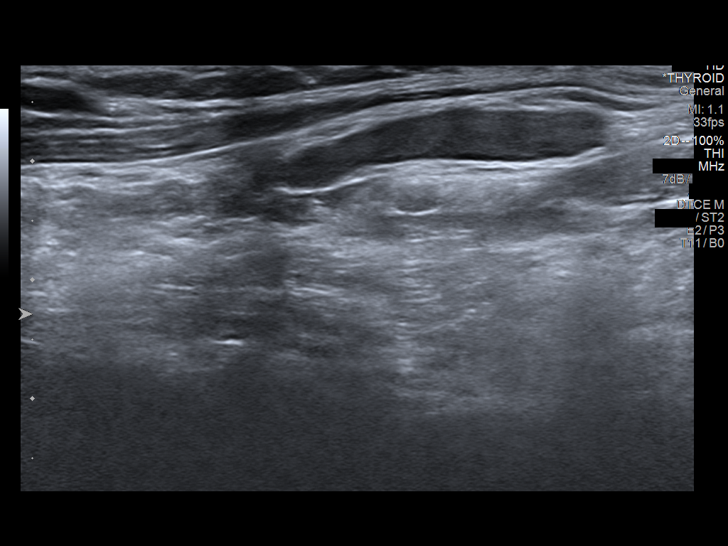
[im 55/55]
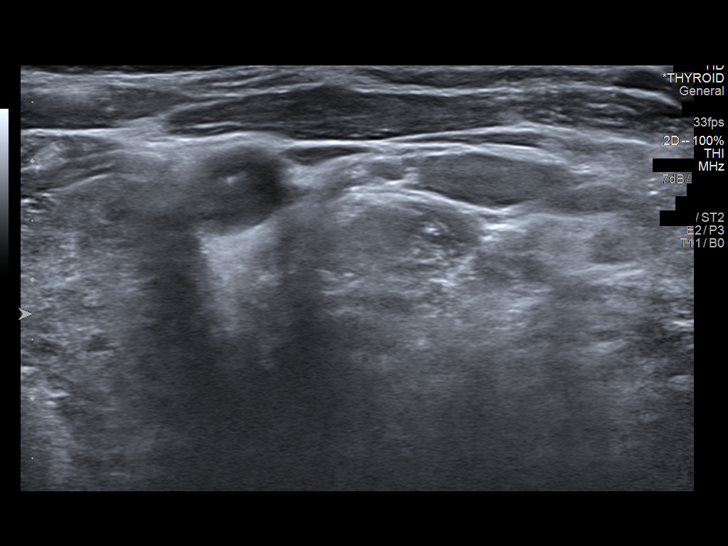

[14 of 25 positions shown; findings below may reference images not displayed]

FINDINGS: Parenchymal Echotexture: Markedly heterogenous

Isthmus: 0.2 cm

Right lobe: 5.5 x 1.5 x 2.2 cm

Left lobe: 5.4 x 1.9 x 2.7 cm

_________________________________________________________

Estimated total number of nodules >/= 1 cm: 0

Number of spongiform nodules >/=  2 cm not described below (TR1): 0

Number of mixed cystic and solid nodules >/= 1.5 cm not described
below (TR2): 0

_________________________________________________________

Diffusely enlarged, heterogeneous and lobular thyroid gland most
consistent with diffuse goitrous change. No definite discrete
thyroid nodules are identified.
IMPRESSION: Diffusely enlarged, heterogeneous and lobular thyroid gland most
consistent with diffuse goitrous change.

No discrete nodules identified.

## 2018-09-12 ENCOUNTER — Encounter: Payer: Self-pay | Admitting: Psychiatry

## 2018-09-12 ENCOUNTER — Ambulatory Visit (INDEPENDENT_AMBULATORY_CARE_PROVIDER_SITE_OTHER): Payer: BC Managed Care – PPO | Admitting: Psychiatry

## 2018-09-12 ENCOUNTER — Other Ambulatory Visit: Payer: Self-pay

## 2018-09-12 DIAGNOSIS — F99 Mental disorder, not otherwise specified: Secondary | ICD-10-CM

## 2018-09-12 DIAGNOSIS — F3176 Bipolar disorder, in full remission, most recent episode depressed: Secondary | ICD-10-CM | POA: Diagnosis not present

## 2018-09-12 DIAGNOSIS — F5105 Insomnia due to other mental disorder: Secondary | ICD-10-CM | POA: Diagnosis not present

## 2018-09-12 MED ORDER — EQUETRO 200 MG PO CP12: 400.0000 mg | ORAL_CAPSULE | Freq: Two times a day (BID) | ORAL | 5 refills | Status: DC

## 2018-09-12 MED ORDER — LATUDA 120 MG PO TABS: ORAL_TABLET | ORAL | 1 refills | Status: DC

## 2018-09-12 NOTE — Progress Notes (Signed)
Diana Decker [id number] 1965-07-27 53 y.o.  Subjective:   Patient ID:  Diana Decker is a 53 y.o. (DOB [birth year]) female.  Chief Complaint:  Chief Complaint  Patient presents with  . Follow-up    h/o Bipolar D/O, Anxiety    HPI Diana Decker presents to the office today for follow-up of anxiety and Bipolar D/O. She reports that she had some difficulty with depression and anxiety at the start of the pandemic and was eating excessively. Reports that she had some passing thoughts of suicide at that time.  She reports that increase in Equetro was helpful for her mood. She reports that she made behavioral changes at that time to include limiting exposure to news walking regularly, seeing therapist regularly, and attending virtual meetings.  Reports that her mood has recently been stable. Denies significant depression. Reports anxiety has improved with limiting exposure to news. She reports adequate sleep. Reports that she was sleeping more when not working and sleep was somewhat altered. Energy and motivation have been good. She reports that her concentration has been good overall. Reports that she has been making a few more mistakes than she would like since returning to work. Denies SI.   Reports that she "went on a little bit of a spending spree" when she got her new job and reports that she spent less than $500.   She reports 25 lb intentional weight loss since April. She reports that she has been using Noom and eating healthier choices and increased exercise.   Completed school in May and has been unemployed. Reports that she started her job search during the pandemic and this was challenging.  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 Reports that she has been trying to help her mother who is in her 6's.   Past Psychiatric Medication Trials: Latuda- Had restless legs on higher doses.  Good response to 60 mg  dose Equetro-effective for mood stabilization without visual changes Carbamazepine (generic)-had vision changes and drowsiness Abilify-effective.  Experience compulsive eating and spending.  Weight gain Saphris Depakote-some weight gain Lithium-took briefly Serzone Trazodone Lamictal-caused insomnia Melatonin    Review of Systems:  Review of Systems  Musculoskeletal: Negative for gait problem.       Has plantar fascitis   Neurological: Negative for tremors.  Psychiatric/Behavioral:       Please refer to HPI    Medications: I have reviewed the patient's current medications.  Current Outpatient Medications  Medication Sig Dispense Refill  . EQUETRO 200 MG CP12 12 hr capsule Take 2 capsules (400 mg total) by mouth 2 (two) times daily. 120 capsule 5  . levothyroxine (SYNTHROID, LEVOTHROID) 50 MCG tablet Take 1 tablet (50 mcg total) by mouth daily before breakfast. 90 tablet 1  . Lurasidone HCl (LATUDA) 120 MG TABS Take 1/2-1 tab po q evening with a meal 90 tablet 1  . Multiple Vitamin (MULTIVITAMIN) tablet Take 1 tablet by mouth daily.    . Omega-3 Fatty Acids (FISH OIL PO) Take by mouth.    . valACYclovir (VALTREX) 1000 MG tablet TAKE 1/2 TABLET BY MOUTH EVERY DAY 45 tablet 1   No current facility-administered medications for this visit.     Medication Side Effects: None  Allergies: No Known Allergies  Past Medical History:  Diagnosis Date  . Alcoholism ([place])   . Anemia   . Anxiety   . Bipolar 1 disorder ([place])   . Borderline personality disorder (  [place])   . Depression   . GERD (gastroesophageal reflux disease)   . Hyperlipidemia 08/19/2017  . Hypothyroidism 06/08/2010  . IBS (irritable bowel syndrome)   . 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 Breast cancer Mother   . Depression Mother   . Prostate cancer Father   . Heart disease Father   . Alcohol abuse Father   . Depression  Father   . Anxiety disorder Father   . Depression Maternal Uncle   . Alcohol abuse Maternal Uncle   . Suicidality Maternal Uncle   . Alcohol abuse Half-Sister   . Bipolar disorder Half-Sister   . Colon cancer Neg Hx     Social History   Socioeconomic History  . Marital status: Married    Spouse name: Not on file  . Number of children: Not on file  . Years of education: Not on file  . Highest education level: Not on file  Occupational History  . Occupation: Hospital doctor  Social Needs  . Financial resource strain: Not on file  . Food insecurity    Worry: Never true    Inability: Never true  . Transportation needs    Medical: Not on file    Non-medical: Not on file  Tobacco Use  . Smoking status: Never Smoker  . Smokeless tobacco: Never Used  Substance and Sexual Activity  . Alcohol use: No  . Drug use: No  . Sexual activity: Yes    Birth control/protection: Surgical    Comment: husband with vasectomy  Lifestyle  . Physical activity    Days per week: Not on file    Minutes per session: Not on file  . Stress: Not on file  Relationships  . Social Herbalist on phone: Not on file    Gets together: Not on file    Attends religious service: Not on file    Active member of club or organization: Not on file    Attends meetings of clubs or organizations: Not on file    Relationship status: Not on file  . Intimate partner violence    Fear of current or ex partner: Not on file    Emotionally abused: Not on file    Physically abused: Not on file    Forced sexual activity: Not on file  Other Topics Concern  . 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   Please see review of systems for further details on the patient's review from today.   Objective:    Physical Exam:  BP 110/71   Physical Exam Constitutional:      General: She is not in acute distress.    Appearance: She is well-developed.  Musculoskeletal:        General: No deformity.  Neurological:     Mental Status: She is alert and oriented to person, place, and time.     Coordination: Coordination normal.  Psychiatric:        Attention and Perception: Attention and perception normal. She does not perceive auditory or visual hallucinations.        Mood and Affect: Mood normal. Mood is not anxious or depressed. Affect is not labile, blunt, angry or inappropriate.  Speech: Speech normal.        Behavior: Behavior normal.        Thought Content: Thought content normal. Thought content does not include homicidal or suicidal ideation. Thought content does not include homicidal or suicidal plan.        Cognition and Memory: Cognition and memory normal.        Judgment: Judgment normal.     Comments: Insight intact. No delusions.      Lab Review:     Component Value Date/Time   NA 139 08/19/2017 1114   K 4.8 08/19/2017 1114   CL 102 08/19/2017 1114   CO2 31 08/19/2017 1114   GLUCOSE 100 (H) 08/19/2017 1114   BUN 10 08/19/2017 1114   CREATININE 0.65 08/19/2017 1114   CREATININE 0.74 02/09/2015 0702   CALCIUM 9.9 08/19/2017 1114   PROT 7.5 03/14/2018 0906   ALBUMIN 4.4 08/19/2017 1114   AST 15 03/14/2018 0906   ALT 16 03/14/2018 0906   ALKPHOS 102 08/19/2017 1114   BILITOT 0.3 03/14/2018 0906   GFRNONAA >90 07/11/2013 1511   GFRAA >90 07/11/2013 1511       Component Value Date/Time   WBC 4.3 03/14/2018 0906   RBC 4.11 03/14/2018 0906   HGB 13.1 03/14/2018 0906   HCT 38.5 03/14/2018 0906   PLT 286 03/14/2018 0906   MCV 93.7 03/14/2018 0906   MCH 31.9 03/14/2018 0906   MCHC 34.0 03/14/2018 0906   RDW 12.3 03/14/2018 0906   LYMPHSABS 1,548 03/14/2018 0906   MONOABS 0.4 08/19/2017 1114   EOSABS 288 03/14/2018 0906   BASOSABS 52 03/14/2018 0906    No  results found for: POCLITH, LITHIUM   Lab Results  Component Value Date   CBMZ 6.1 03/14/2018     .res Assessment: Plan:   Will continue Equetro 400 mg twice daily for mood stabilization.  Patient reports that she will likely have yearly physical exam again in January and will request lab results at that time. Will continue Latuda 60 mg daily with evening meal for mood signs and symptoms. Patient to follow-up in 6 months or sooner if clinically indicated. Patient advised to contact office with any questions, adverse effects, or acute worsening in signs and symptoms.  Diana Decker was seen today for follow-up.  Diagnoses and all orders for this visit:  Bipolar disorder, in full remission, most recent episode depressed ([place]) -     EQUETRO 200 MG CP12 12 hr capsule; Take 2 capsules (400 mg total) by mouth 2 (two) times daily. -     Lurasidone HCl (LATUDA) 120 MG TABS; Take 1/2-1 tab po q evening with a meal  Insomnia due to other mental disorder -     EQUETRO 200 MG CP12 12 hr capsule; Take 2 capsules (400 mg total) by mouth 2 (two) times daily.     Please see After Visit Summary for patient specific instructions.  Future Appointments  Date Time Provider [place]  09/18/2018  8:15 AM [facility] Heckler [facility]  03/13/2019  8:30 AM Thayer Headings, PMHNP [facility] None    No orders of the defined types were placed in this encounter.   -------------------------------

## 2018-09-12 NOTE — Progress Notes (Signed)
   09/12/18 0934  Facial and Oral Movements  Muscles of Facial Expression 0  Lips and Perioral Area 0  Jaw 0  Tongue 0  Extremity Movements  Upper (arms, wrists, hands, fingers) 0  Lower (legs, knees, ankles, toes) 0  Trunk Movements  Neck, shoulders, hips 0  Overall Severity  Severity of abnormal movements (highest score from questions above) 0  Incapacitation due to abnormal movements 0  Patient's awareness of abnormal movements (rate only patient's report) 0  Dental Status  Current problems with teeth and/or dentures? No  Does patient usually wear dentures? No  AIMS Total Score  AIMS Total Score 0

## 2018-09-18 ENCOUNTER — Other Ambulatory Visit: Payer: BC Managed Care – PPO | Admitting: Orthotics

## 2018-09-19 ENCOUNTER — Other Ambulatory Visit: Payer: Self-pay

## 2018-09-19 ENCOUNTER — Other Ambulatory Visit: Payer: BC Managed Care – PPO | Admitting: Orthotics

## 2018-09-23 ENCOUNTER — Other Ambulatory Visit: Payer: BC Managed Care – PPO | Admitting: Orthotics

## 2018-12-10 ENCOUNTER — Ambulatory Visit (INDEPENDENT_AMBULATORY_CARE_PROVIDER_SITE_OTHER): Payer: BC Managed Care – PPO | Admitting: Podiatry

## 2018-12-10 ENCOUNTER — Encounter: Payer: Self-pay | Admitting: Podiatry

## 2018-12-10 ENCOUNTER — Other Ambulatory Visit: Payer: Self-pay

## 2018-12-10 DIAGNOSIS — M722 Plantar fascial fibromatosis: Secondary | ICD-10-CM

## 2018-12-10 NOTE — Patient Instructions (Signed)
Pre-Operative Instructions  Congratulations, you have decided to take an important step towards improving your quality of life.  You can be assured that the doctors and staff at [facility] will be with you every step of the way.  Here are some important things you should know:  1. Plan to be at the surgery center/hospital at least 1 (one) hour prior to your scheduled time, unless otherwise directed by the surgical center/hospital staff.  You must have a responsible adult accompany you, remain during the surgery and drive you home.  Make sure you have directions to the surgical center/hospital to ensure you arrive on time. 2. If you are having surgery at [facility] or [facility], you will need a copy of your medical history and physical form from your family physician within one month prior to the date of surgery. We will give you a form for your primary physician to complete.  3. We make every effort to accommodate the date you request for surgery.  However, there are times where surgery dates or times have to be moved.  We will contact you as soon as possible if a change in schedule is required.   4. No aspirin/ibuprofen for one week before surgery.  If you are on aspirin, any non-steroidal anti-inflammatory medications (Mobic, Aleve, Ibuprofen) should not be taken seven (7) days prior to your surgery.  You make take Tylenol for pain prior to surgery.  5. Medications - If you are taking daily heart and blood pressure medications, seizure, reflux, allergy, asthma, anxiety, pain or diabetes medications, make sure you notify the surgery center/hospital before the day of surgery so they can tell you which medications you should take or avoid the day of surgery. 6. No food or drink after midnight the night before surgery unless directed otherwise by surgical center/hospital staff. 7. No alcoholic beverages 24-hours prior to surgery.  No smoking 24-hours prior or 24-hours after  surgery. 8. Wear loose pants or shorts. They should be loose enough to fit over bandages, boots, and casts. 9. Don't wear slip-on shoes. Sneakers are preferred. 10. Bring your boot with you to the surgery center/hospital.  Also bring crutches or a walker if your physician has prescribed it for you.  If you do not have this equipment, it will be provided for you after surgery. 11. If you have not been contacted by the surgery center/hospital by the day before your surgery, call to confirm the date and time of your surgery. 12. Leave-time from work may vary depending on the type of surgery you have.  Appropriate arrangements should be made prior to surgery with your employer. 13. Prescriptions will be provided immediately following surgery by your doctor.  Fill these as soon as possible after surgery and take the medication as directed. Pain medications will not be refilled on weekends and must be approved by the doctor. 14. Remove nail polish on the operative foot and avoid getting pedicures prior to surgery. 15. Wash the night before surgery.  The night before surgery wash the foot and leg well with water and the antibacterial soap provided. Be sure to pay special attention to beneath the toenails and in between the toes.  Wash for at least three (3) minutes. Rinse thoroughly with water and dry well with a towel.  Perform this wash unless told not to do so by your physician.  Enclosed: 1 Ice pack (please put in freezer the night before surgery)   1 Hibiclens skin cleaner     Pre-op instructions  If you have any questions regarding the instructions, please do not hesitate to call our office.  [place]: [address], [place], [place] [address] -- [phone]  [place]: [address] [place], [place] [address] -- [phone]  [place]: [address]  [place], [place] [address] -- [phone]   Website: [email or web address] 

## 2018-12-10 NOTE — Progress Notes (Signed)
Subjective:   Patient ID: Diana Decker, female   DOB: 53 y.o.   MRN: [id number]   HPI Patient presents stating that she still has terrible pain in her right heel and she simply cannot be active like she wants and is reached a point where she is no longer able to be active.  Is interested in a long-term solution for her problem   ROS      Objective:  Physical Exam  Neurovascular status intact with long-term history of plantar fascial pain right that I been treating for approximately 6 months without relief.  Exquisite discomfort noted upon palpation to the medial band plantar fascia right at insertion     Assessment:  Acute plantar fasciitis right with inflammation     Plan:  Reviewed condition and recommended long-term endoscopic fascial surgery after reviewing different treatment options and pros and cons of these.  Patient has made this decision herself and at this point I did allow her to read consent form reviewing alternative treatments complications associated with surgery and she is willing to accept risk want surgery and signed consent form.  Patient understands total recovery can take 6 months to 1 year and I did dispense air fracture walker and she did understand that she can develop arch pain lateral pain that can last for extensive periods of time

## 2018-12-12 ENCOUNTER — Telehealth: Payer: Self-pay | Admitting: Podiatry

## 2018-12-12 NOTE — Telephone Encounter (Signed)
DOS: 01/06/2019 SURGICAL PROCEDURE: (EPF) Endoscopic Plantar Fasciotomy [phone]) DX CODE: Plantar Fasciitis(M72.2)  [employer] Effective : [id number]  -  02/11/2198  Member Liability Summary       In-Network   Max Per Benefit Period Year-to-Date Remaining     CoInsurance 0%         Deductible $3000.00 $65.27     Out-Of-Pocket 3 $3000.00 $0.00 3 Out-of-Pocket includes copay, deductible, and coinsurance.  [place] Required Not Applicable 0%  per  Service Year  No

## 2019-01-06 ENCOUNTER — Encounter: Payer: Self-pay | Admitting: Podiatry

## 2019-01-06 DIAGNOSIS — M722 Plantar fascial fibromatosis: Secondary | ICD-10-CM

## 2019-01-07 ENCOUNTER — Telehealth: Payer: Self-pay | Admitting: *Deleted

## 2019-01-07 NOTE — Telephone Encounter (Signed)
Left a voicemail message at [phone] (Cell #) to check to see how they were doing from when they had surgery with Dr. Paulla Dolly yesterday, 01/06/19.  DOS 01/06/19 EPF Medial RT  Patient's next appointment will be on:  Thursday, January 15, 2019 10:15 am with Dr. Paulla Dolly

## 2019-01-12 ENCOUNTER — Ambulatory Visit (INDEPENDENT_AMBULATORY_CARE_PROVIDER_SITE_OTHER): Payer: BC Managed Care – PPO

## 2019-01-12 ENCOUNTER — Ambulatory Visit (INDEPENDENT_AMBULATORY_CARE_PROVIDER_SITE_OTHER): Payer: BC Managed Care – PPO | Admitting: Podiatry

## 2019-01-12 ENCOUNTER — Encounter: Payer: Self-pay | Admitting: Podiatry

## 2019-01-12 ENCOUNTER — Other Ambulatory Visit: Payer: Self-pay

## 2019-01-12 VITALS — BP 109/72 | Temp 98.2°F

## 2019-01-12 DIAGNOSIS — M722 Plantar fascial fibromatosis: Secondary | ICD-10-CM | POA: Diagnosis not present

## 2019-01-12 NOTE — Progress Notes (Signed)
Subjective:   Patient ID: Diana Decker, female   DOB: 53 y.o.   MRN: [id number]   HPI Patient states doing well with the right foot with minimal discomfort and did have some bleeding but it stopped   ROS      Objective:  Physical Exam  Neurovascular status intact negative Bevelyn Buckles' sign noted wound edges well coapted right medial lateral heel with stitches in place and minimal discomfort     Assessment:  Doing well post endoscopic surgery right heel     Plan:  H&P reviewed condition reapplied sterile dressing instructed on continued elevation compression and boot usage and dispensed surgical shoe and reappoint 2 weeks suture removal or earlier if needed

## 2019-01-15 ENCOUNTER — Encounter: Payer: BC Managed Care – PPO | Admitting: Podiatry

## 2019-01-29 ENCOUNTER — Ambulatory Visit (INDEPENDENT_AMBULATORY_CARE_PROVIDER_SITE_OTHER): Payer: BC Managed Care – PPO | Admitting: Podiatry

## 2019-01-29 ENCOUNTER — Encounter: Payer: Self-pay | Admitting: Podiatry

## 2019-01-29 ENCOUNTER — Other Ambulatory Visit: Payer: Self-pay

## 2019-01-29 DIAGNOSIS — M722 Plantar fascial fibromatosis: Secondary | ICD-10-CM | POA: Diagnosis not present

## 2019-01-29 NOTE — Progress Notes (Signed)
Subjective:   Patient ID: Diana Decker, female   DOB: 53 y.o.   MRN: [id number]   HPI Patient presents stating I am doing well but I feel like I need to stretch my foot out more in the boot is heavy   ROS      Objective:  Physical Exam  Neuro vascular status intact with incision sites right healed well wound edges well coapted stitches intact with patient found to have mild to moderate discomfort still in the arch     Assessment:  Overall doing well post endoscopic surgery right with patient needing to stretch more     Plan:  Stitches removed wound edges coapted well sterile dressings applied and at this point I went ahead and I dispensed night splint to wear as it is lighter than the boot and should be easier when she is sitting.  I also dispensed compression stocking and patient will be seen back and is encouraged to call with questions concerns

## 2019-02-16 ENCOUNTER — Other Ambulatory Visit: Payer: Self-pay | Admitting: Family

## 2019-03-13 ENCOUNTER — Ambulatory Visit (INDEPENDENT_AMBULATORY_CARE_PROVIDER_SITE_OTHER): Payer: BC Managed Care – PPO | Admitting: Psychiatry

## 2019-03-13 ENCOUNTER — Encounter: Payer: Self-pay | Admitting: Psychiatry

## 2019-03-13 ENCOUNTER — Other Ambulatory Visit: Payer: Self-pay

## 2019-03-13 VITALS — Wt 219.8 lb

## 2019-03-13 DIAGNOSIS — E039 Hypothyroidism, unspecified: Secondary | ICD-10-CM | POA: Diagnosis not present

## 2019-03-13 DIAGNOSIS — F5105 Insomnia due to other mental disorder: Secondary | ICD-10-CM

## 2019-03-13 DIAGNOSIS — F3176 Bipolar disorder, in full remission, most recent episode depressed: Secondary | ICD-10-CM

## 2019-03-13 DIAGNOSIS — Z79899 Other long term (current) drug therapy: Secondary | ICD-10-CM | POA: Diagnosis not present

## 2019-03-13 DIAGNOSIS — F99 Mental disorder, not otherwise specified: Secondary | ICD-10-CM

## 2019-03-13 DIAGNOSIS — R5383 Other fatigue: Secondary | ICD-10-CM

## 2019-03-13 MED ORDER — LATUDA 120 MG PO TABS: ORAL_TABLET | ORAL | 1 refills | Status: DC

## 2019-03-13 MED ORDER — EQUETRO 200 MG PO CP12: 400.0000 mg | ORAL_CAPSULE | Freq: Two times a day (BID) | ORAL | 5 refills | Status: DC

## 2019-03-13 NOTE — Progress Notes (Signed)
Diana Decker [id number] 1965-10-11 54 y.o.  Subjective:   Patient ID:  Diana Decker is a 54 y.o. (DOB [birth year]) female.  Chief Complaint:  Chief Complaint  Patient presents with  . Follow-up    h/o Anxiety and Bipolar D/O    HPI Diana Decker presents to the office today for follow-up of anxiety and depression. Has had some work stress and has awakened a few times during the night with anxious thoughts. Recently overheard conversation where coworkers were being critical of decisions that she made. Reports that stress increased after Thanksgiving with changes in work. Also had foot surgery at Thanksgiving and mobility has been limited and usually walking helps relieve her stress. Denies feeling anxious during the day.   She reports that her mood has been "pretty good." She reports that Carbamazepine is helpful for suicidal thouhgts and will notice some passive suicidal thoughts if she forgets to take Carbamazepine. She reports that she had some depression when her foot pain was severe. Denies any recent manic s/s. Sleeping about 7 hours most nights. She reports that she has gained 30 lbs since she started to have foot pain in August. Reports that she started using Noom again about 2 weeks ago. Energy and motivation are improving since foot pain has improved. Concentration has been ok. She reports that her coworkers teased her about having a significantly better memory than them. Denies SI.   Reports that she plays a video game occasionally that helps get her thoughts re-directed.   Continues to see Diana Decker, LPC.   Past Psychiatric Medication Trials: Latuda- Had restless legs on higher doses.Good response to 60 mg dose Equetro-effective for mood stabilizationwithout visual changes Carbamazepine(generic)-had vision changes and drowsiness Abilify-effective. Experience compulsive eating and spending. Weight gain Saphris Depakote-some weight gain Lithium-took  briefly Serzone Trazodone Lamictal-caused insomnia Melatonin     AIMS     Office Visit from 09/12/2018 in [place] Total Score  0    PHQ2-9     Office Visit from 07/01/2017 in [facility] [place] Visit from 05/29/2016 in [place] [facility] [place]  PHQ-2 Total Score  0  4  PHQ-9 Total Score  2  13       Review of Systems:  Review of Systems  Eyes: Negative for visual disturbance.  Musculoskeletal: Negative for gait problem.       Had foot surgery for plantar fasciitis and pain is improving.   Neurological: Negative for tremors.  Psychiatric/Behavioral:       Please refer to HPI    Medications: I have reviewed the patient's current medications.  Current Outpatient Medications  Medication Sig Dispense Refill  . EQUETRO 200 MG CP12 12 hr capsule Take 2 capsules (400 mg total) by mouth 2 (two) times daily. 120 capsule 5  . Ferrous Sulfate (IRON SLOW RELEASE PO) Take by mouth.    . levothyroxine (SYNTHROID) 50 MCG tablet TAKE 1 TABLET BY MOUTH DAILY BEFORE BREAKFAST 30 tablet 0  . Lurasidone HCl (LATUDA) 120 MG TABS Take 1/2-1 tab po q evening with a meal 90 tablet 1  . Multiple Vitamin (MULTIVITAMIN) tablet Take 1 tablet by mouth daily.    . Omega-3 Fatty Acids (FISH OIL PO) Take by mouth.    . valACYclovir (VALTREX) 1000 MG tablet TAKE 1/2 TABLET BY MOUTH EVERY DAY 45 tablet 1   No current facility-administered medications for this visit.    Medication  Side Effects: None  Allergies: No Known Allergies  Past Medical History:  Diagnosis Date  . Alcoholism ([place])   . Anemia   . Anxiety   . Bipolar 1 disorder ([place])   . Borderline personality disorder ([place])   . Depression   . GERD (gastroesophageal reflux disease)   . Hyperlipidemia 08/19/2017  . Hypothyroidism 06/08/2010  . IBS (irritable bowel syndrome)   . 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 Breast cancer Mother   . Depression Mother   . Prostate cancer Father   . Heart disease Father   . Alcohol abuse Father   . Depression Father   . Anxiety disorder Father   . Depression Maternal Uncle   . Alcohol abuse Maternal Uncle   . Suicidality Maternal Uncle   . Alcohol abuse Half-Sister   . Bipolar disorder Half-Sister   . Colon cancer Neg Hx     Social History   Socioeconomic History  . Marital status: Married    Spouse name: Not on file  . Number of children: Not on file  . Years of education: Not on file  . Highest education level: Not on file  Occupational History  . Occupation: Hospital doctor  Tobacco Use  . Smoking status: Never Smoker  . Smokeless tobacco: Never Used  Substance and Sexual Activity  . Alcohol use: No  . Drug use: No  . Sexual activity: Yes    Birth control/protection: Surgical    Comment: husband with vasectomy  Other Topics Concern  . 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 Difficulty of Paying Living Expenses: Not on file  Food Insecurity:   . Worried About Charity fundraiser in the Last Year: Not on file  . Ran Out of Food in the Last Year: Not on file  Transportation Needs:   . Lack of Transportation (Medical): Not on file  . Lack of Transportation (Non-Medical): Not on file  Physical Activity:   . Days of Exercise per Week: Not on file  . Minutes of Exercise per Session: Not on file  Stress:   . Feeling of Stress : Not on file  Social Connections:   . Frequency of Communication with Friends and Family: Not on file  . Frequency of Social Gatherings with Friends and Family: Not on file  . Attends Religious Services: Not on file  . Active Member of Clubs or Organizations: Not on file  . Attends Archivist  Meetings: Not on file  . Marital Status: Not on file  Intimate Partner Violence:   . Fear of Current or Ex-Partner: Not on file  . Emotionally Abused: Not on file  . Physically Abused: Not on file  . Sexually Abused: Not on file    Past Medical History, Surgical history, Social history, and Family history were reviewed and updated as appropriate.   Please see review of systems for further details on the patient's review from today.   Objective:   Physical Exam:  Wt 219 lb 12.8 oz (99.7 kg)   BMI 34.43 kg/m   Physical Exam Constitutional:      General: She is not in acute distress.    Appearance: She is well-developed.  Musculoskeletal:  General: No deformity.  Neurological:     Mental Status: She is alert and oriented to person, place, and time.     Coordination: Coordination normal.  Psychiatric:        Attention and Perception: Attention and perception normal. She does not perceive auditory or visual hallucinations.        Mood and Affect: Mood normal. Mood is not anxious or depressed. Affect is not labile, blunt, angry or inappropriate.        Speech: Speech normal.        Behavior: Behavior normal.        Thought Content: Thought content normal. Thought content is not paranoid or delusional. Thought content does not include homicidal or suicidal ideation. Thought content does not include homicidal or suicidal plan.        Cognition and Memory: Cognition and memory normal.        Judgment: Judgment normal.     Comments: Insight intact     Lab Review:     Component Value Date/Time   NA 139 08/19/2017 1114   K 4.8 08/19/2017 1114   CL 102 08/19/2017 1114   CO2 31 08/19/2017 1114   GLUCOSE 100 (H) 08/19/2017 1114   BUN 10 08/19/2017 1114   CREATININE 0.65 08/19/2017 1114   CREATININE 0.74 02/09/2015 0702   CALCIUM 9.9 08/19/2017 1114   PROT 7.5 03/14/2018 0906   ALBUMIN 4.4 08/19/2017 1114   AST 15 03/14/2018 0906   ALT 16 03/14/2018 0906   ALKPHOS 102  08/19/2017 1114   BILITOT 0.3 03/14/2018 0906   GFRNONAA >90 07/11/2013 1511   GFRAA >90 07/11/2013 1511       Component Value Date/Time   WBC 4.3 03/14/2018 0906   RBC 4.11 03/14/2018 0906   HGB 13.1 03/14/2018 0906   HCT 38.5 03/14/2018 0906   PLT 286 03/14/2018 0906   MCV 93.7 03/14/2018 0906   MCH 31.9 03/14/2018 0906   MCHC 34.0 03/14/2018 0906   RDW 12.3 03/14/2018 0906   LYMPHSABS 1,548 03/14/2018 0906   MONOABS 0.4 08/19/2017 1114   EOSABS 288 03/14/2018 0906   BASOSABS 52 03/14/2018 0906    No results found for: POCLITH, LITHIUM   Lab Results  Component Value Date   CBMZ 6.1 03/14/2018     .res Assessment: Plan:   Will order labs to evaluate for potential adverse effects with carbamazepine, to include CMP and CBC.  We will also order carbamazepine level to determine current blood level.  We will also order TSH since patient has history of hypothyroidism and reports that TSH has not been checked recently and she had some recent fatigue.  Will also order vitamin D level since low vitamin D could also contribute to recent fatigue. Will continue Latuda 120 mg one half tab p.o. daily with evening meal. Will continue Equetro 400 mg twice daily for mood stabilization and anxiety. Patient to follow-up in 6 months or sooner if clinically indicated. Recommend continuing psychotherapy with Diana Decker, LPC. Patient advised to contact office with any questions, adverse effects, or acute worsening in signs and symptoms.  Diana Decker was seen today for follow-up.  Diagnoses and all orders for this visit:  Bipolar disorder, in full remission, most recent episode depressed ([place]) -     EQUETRO 200 MG CP12 12 hr capsule; Take 2 capsules (400 mg total) by mouth 2 (two) times daily. -     Lurasidone HCl (LATUDA) 120 MG TABS; Take 1/2-1 tab po q evening  with a meal  High risk medication use -     Comprehensive metabolic panel -     CBC with Differential/Platelet -     Carbamazepine  level, total  Insomnia due to other mental disorder -     EQUETRO 200 MG CP12 12 hr capsule; Take 2 capsules (400 mg total) by mouth 2 (two) times daily.  Hypothyroidism, unspecified type -     TSH  Fatigue, unspecified type -     TSH -     VITAMIN D 25 Hydroxy (Vit-D Deficiency, Fractures)     Please see After Visit Summary for patient specific instructions.  Future Appointments  Date Time Provider [place]  09/10/2019  8:30 AM Thayer Headings, PMHNP CP-CP None    Orders Placed This Encounter  Procedures  . TSH  . Comprehensive metabolic panel  . CBC with Differential/Platelet  . Carbamazepine level, total  . VITAMIN D 25 Hydroxy (Vit-D Deficiency, Fractures)    -------------------------------

## 2019-03-19 ENCOUNTER — Other Ambulatory Visit: Payer: Self-pay | Admitting: Family

## 2019-03-19 LAB — CBC WITH DIFFERENTIAL/PLATELET
Absolute Monocytes: 485 cells/uL (ref 200–950)
Basophils Absolute: 48 cells/uL (ref 0–200)
Basophils Relative: 1 %
Eosinophils Absolute: 230 cells/uL (ref 15–500)
Eosinophils Relative: 4.8 %
HCT: 37.6 % (ref 35.0–45.0)
Hemoglobin: 12.8 g/dL (ref 11.7–15.5)
Lymphs Abs: 2035 cells/uL (ref 850–3900)
MCH: 32.7 pg (ref 27.0–33.0)
MCHC: 34 g/dL (ref 32.0–36.0)
MCV: 95.9 fL (ref 80.0–100.0)
MPV: 11.1 fL (ref 7.5–12.5)
Monocytes Relative: 10.1 %
Neutro Abs: 2002 cells/uL (ref 1500–7800)
Neutrophils Relative %: 41.7 %
Platelets: 300 10*3/uL (ref 140–400)
RBC: 3.92 10*6/uL (ref 3.80–5.10)
RDW: 12.1 % (ref 11.0–15.0)
Total Lymphocyte: 42.4 %
WBC: 4.8 10*3/uL (ref 3.8–10.8)

## 2019-03-19 LAB — COMPREHENSIVE METABOLIC PANEL
AG Ratio: 1.4 (calc) (ref 1.0–2.5)
ALT: 14 U/L (ref 6–29)
AST: 13 U/L (ref 10–35)
Albumin: 4.4 g/dL (ref 3.6–5.1)
Alkaline phosphatase (APISO): 85 U/L (ref 37–153)
BUN: 17 mg/dL (ref 7–25)
CO2: 30 mmol/L (ref 20–32)
Calcium: 9.6 mg/dL (ref 8.6–10.4)
Chloride: 104 mmol/L (ref 98–110)
Creat: 0.64 mg/dL (ref 0.50–1.05)
Globulin: 3.1 g/dL (calc) (ref 1.9–3.7)
Glucose, Bld: 85 mg/dL (ref 65–139)
Potassium: 4.2 mmol/L (ref 3.5–5.3)
Sodium: 141 mmol/L (ref 135–146)
Total Bilirubin: 0.2 mg/dL (ref 0.2–1.2)
Total Protein: 7.5 g/dL (ref 6.1–8.1)

## 2019-03-19 LAB — CARBAMAZEPINE LEVEL, TOTAL: Carbamazepine Lvl: 8 mg/L (ref 4.0–12.0)

## 2019-03-19 LAB — VITAMIN D 25 HYDROXY (VIT D DEFICIENCY, FRACTURES): Vit D, 25-Hydroxy: 16 ng/mL — ABNORMAL LOW (ref 30–100)

## 2019-03-19 LAB — TSH: TSH: 3.07 mIU/L

## 2019-03-20 ENCOUNTER — Encounter: Payer: BLUE CROSS/BLUE SHIELD | Admitting: Family

## 2019-03-31 ENCOUNTER — Encounter: Payer: BLUE CROSS/BLUE SHIELD | Admitting: Family

## 2019-04-17 ENCOUNTER — Encounter: Payer: Self-pay | Admitting: Family

## 2019-04-21 ENCOUNTER — Other Ambulatory Visit: Payer: Self-pay | Admitting: Family

## 2019-04-24 ENCOUNTER — Encounter: Payer: Self-pay | Admitting: Family

## 2019-04-24 ENCOUNTER — Other Ambulatory Visit: Payer: Self-pay

## 2019-04-24 ENCOUNTER — Ambulatory Visit (INDEPENDENT_AMBULATORY_CARE_PROVIDER_SITE_OTHER): Payer: BC Managed Care – PPO | Admitting: Family

## 2019-04-24 VITALS — BP 127/76 | HR 76 | Temp 97.4°F | Resp 16 | Ht 67.0 in | Wt 231.0 lb

## 2019-04-24 DIAGNOSIS — Z0000 Encounter for general adult medical examination without abnormal findings: Secondary | ICD-10-CM | POA: Diagnosis not present

## 2019-04-24 DIAGNOSIS — E785 Hyperlipidemia, unspecified: Secondary | ICD-10-CM

## 2019-04-24 DIAGNOSIS — E559 Vitamin D deficiency, unspecified: Secondary | ICD-10-CM

## 2019-04-24 DIAGNOSIS — E039 Hypothyroidism, unspecified: Secondary | ICD-10-CM

## 2019-04-24 DIAGNOSIS — E348 Other specified endocrine disorders: Secondary | ICD-10-CM

## 2019-04-24 LAB — CBC WITH DIFFERENTIAL/PLATELET
Basophils Absolute: 0 10*3/uL (ref 0.0–0.1)
Basophils Relative: 0.8 % (ref 0.0–3.0)
Eosinophils Absolute: 0.2 10*3/uL (ref 0.0–0.7)
Eosinophils Relative: 4.1 % (ref 0.0–5.0)
HCT: 37.6 % (ref 36.0–46.0)
Hemoglobin: 12.6 g/dL (ref 12.0–15.0)
Lymphocytes Relative: 41.8 % (ref 12.0–46.0)
Lymphs Abs: 2.1 10*3/uL (ref 0.7–4.0)
MCHC: 33.5 g/dL (ref 30.0–36.0)
MCV: 97.3 fl (ref 78.0–100.0)
Monocytes Absolute: 0.4 10*3/uL (ref 0.1–1.0)
Monocytes Relative: 7.5 % (ref 3.0–12.0)
Neutro Abs: 2.3 10*3/uL (ref 1.4–7.7)
Neutrophils Relative %: 45.8 % (ref 43.0–77.0)
Platelets: 281 10*3/uL (ref 150.0–400.0)
RBC: 3.86 Mil/uL — ABNORMAL LOW (ref 3.87–5.11)
RDW: 12.7 % (ref 11.5–15.5)
WBC: 5 10*3/uL (ref 4.0–10.5)

## 2019-04-24 LAB — BASIC METABOLIC PANEL
BUN: 14 mg/dL (ref 6–23)
CO2: 31 mEq/L (ref 19–32)
Calcium: 9.4 mg/dL (ref 8.4–10.5)
Chloride: 103 mEq/L (ref 96–112)
Creatinine, Ser: 0.74 mg/dL (ref 0.40–1.20)
GFR: 82.04 mL/min (ref >60.00)
Glucose, Bld: 112 mg/dL — ABNORMAL HIGH (ref 70–99)
Potassium: 3.8 mEq/L (ref 3.5–5.1)
Sodium: 140 mEq/L (ref 135–145)

## 2019-04-24 LAB — LIPID PANEL
Cholesterol: 294 mg/dL — ABNORMAL HIGH (ref 0–200)
HDL: 72.7 mg/dL (ref >39.00)
NonHDL: 221.14
Total CHOL/HDL Ratio: 4
Triglycerides: 242 mg/dL — ABNORMAL HIGH (ref 0.0–149.0)
VLDL: 48.4 mg/dL — ABNORMAL HIGH (ref 0.0–40.0)

## 2019-04-24 LAB — HEPATIC FUNCTION PANEL
ALT: 13 U/L (ref 0–35)
AST: 12 U/L (ref 0–37)
Albumin: 4.2 g/dL (ref 3.5–5.2)
Alkaline Phosphatase: 89 U/L (ref 39–117)
Bilirubin, Direct: 0 mg/dL (ref 0.0–0.3)
Total Bilirubin: 0.2 mg/dL (ref 0.2–1.2)
Total Protein: 7.4 g/dL (ref 6.0–8.3)

## 2019-04-24 LAB — LDL CHOLESTEROL, DIRECT: Direct LDL: 191 mg/dL

## 2019-04-24 NOTE — Progress Notes (Signed)
Subjective:    Patient ID: Diana Decker, female    DOB: [birth year], 54 y.o.   MRN: [id number]  HPI  Patient presents today for complete physical.  Immunizations:  Up to date Diet:  Needs improvement,  Stress eating- wants to start eating less Wt Readings from Last 3 Encounters:  04/24/19 231 lb (104.8 kg)  02/28/18 227 lb (103 kg)  01/03/18 227 lb 6.4 oz (103.1 kg)  Exercise: not much due to recent surgery on her foot, wants to start walking again Colonoscopy: 2011 due Dexa: due Pap Smear: 2019 Mammogram: due, scheduled      Review of Systems  Constitutional: Negative for unexpected weight change.  HENT: Negative for hearing loss and rhinorrhea.   Eyes: Negative for visual disturbance.  Respiratory: Negative for cough and shortness of breath.   Cardiovascular: Negative for chest pain.  Gastrointestinal: Negative for constipation and diarrhea.  Genitourinary: Negative for dysuria, frequency and hematuria.  Musculoskeletal: Negative for arthralgias and myalgias.  Skin: Negative for rash.  Neurological: Negative for headaches.  Hematological: Negative for adenopathy.  Psychiatric/Behavioral:       Reports mood is good   Past Medical History:  Diagnosis Date  . Alcoholism ([place])   . Anemia   . Anxiety   . Bipolar 1 disorder ([place])   . Borderline personality disorder ([place])   . Depression   . GERD (gastroesophageal reflux disease)   . Hyperlipidemia 08/19/2017  . Hypothyroidism 06/08/2010  . IBS (irritable bowel syndrome)   . 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 Marital status: Married    Spouse name: Not on file  . Number of children: Not on file  . Years of education: Not on file  . Highest education level: Not on file  Occupational History  . Occupation: Hospital doctor  Tobacco Use  . Smoking status: Never Smoker  . Smokeless tobacco: Never Used  Substance and Sexual Activity   . Alcohol use: No  . Drug use: No  . Sexual activity: Yes    Birth control/protection: Surgical    Comment: husband with vasectomy  Other Topics Concern  . 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    Social Determinants of Health   Financial Resource Strain:   . Difficulty of Paying Living Expenses:   Food Insecurity:   . Worried About Charity fundraiser in the Last Year:   . Arboriculturist in the Last Year:   Transportation Needs:   . Film/video editor (Medical):   Marland Kitchen Lack of Transportation (Non-Medical):   Physical Activity:   . Days of Exercise per Week:   . Minutes of Exercise per Session:   Stress:   . Feeling of Stress :   Social Connections:   . Frequency of Communication with Friends and Family:   . Frequency of Social Gatherings with Friends and Family:   . Attends Religious Services:   . Active Member of Clubs or Organizations:   . Attends Archivist Meetings:   Marland Kitchen Marital Status:   Intimate Partner Violence:   . Fear of Current or Ex-Partner:   . Emotionally Abused:   Marland Kitchen Physically Abused:   . Sexually Abused:     Past Surgical History:  Procedure Laterality Date  . APPENDECTOMY    .  EYE SURGERY    . FOOT SURGERY    . HERNIA REPAIR     inguinal  . SQUAMOUS CELL CARCINOMA EXCISION      Family History  Problem Relation Age of Onset  . Breast cancer Mother   . Depression Mother   . Prostate cancer Father   . Heart disease Father   . Alcohol abuse Father   . Depression Father   . Anxiety disorder Father   . Depression Maternal Uncle   . Alcohol abuse Maternal Uncle   . Suicidality Maternal Uncle   . Alcohol abuse Half-Sister   . Bipolar disorder Half-Sister   . Colon cancer Neg Hx     No Known Allergies  Current Outpatient Medications on File Prior to Visit  Medication Sig Dispense Refill  . EQUETRO 200 MG CP12 12 hr  capsule Take 2 capsules (400 mg total) by mouth 2 (two) times daily. 120 capsule 5  . Ferrous Sulfate (IRON SLOW RELEASE PO) Take by mouth.    . levothyroxine (SYNTHROID) 50 MCG tablet TAKE 1 TABLET BY MOUTH DAILY BEFORE BREAKFAST 14 tablet 0  . Lurasidone HCl (LATUDA) 120 MG TABS Take 1/2-1 tab po q evening with a meal 90 tablet 1  . Multiple Vitamin (MULTIVITAMIN) tablet Take 1 tablet by mouth daily.    . Omega-3 Fatty Acids (FISH OIL PO) Take by mouth.    . valACYclovir (VALTREX) 1000 MG tablet TAKE 1/2 TABLET BY MOUTH EVERY DAY 45 tablet 1   No current facility-administered medications on file prior to visit.    BP 127/76 (BP Location: Right Arm, Patient Position: Sitting, Cuff Size: Small)   Pulse 76   Temp (!) 97.4 F (36.3 C) (Temporal)   Resp 16   Ht 5\' 7"  (1.702 m)   Wt 231 lb (104.8 kg)   SpO2 100%   BMI 36.18 kg/m       Objective:   Physical Exam  Physical Exam  Constitutional: She is oriented to person, place, and time. She appears well-developed and well-nourished and overweight. No distress.  HENT:  Head: Normocephalic and atraumatic.  Right Ear: Tympanic membrane and ear canal normal.  Left Ear: Tympanic membrane and ear canal normal.  Mouth/Throat: Not examined- pt wearing mask Eyes: Pupils are equal, round, and reactive to light. No scleral icterus.  Neck: Normal range of motion. No thyromegaly present.  Cardiovascular: Normal rate and regular rhythm.   No murmur heard. Pulmonary/Chest: Effort normal and breath sounds normal. No respiratory distress. He has no wheezes. She has no rales. She exhibits no tenderness.  Abdominal: Soft. Bowel sounds are normal. She exhibits no distension and no mass. There is no tenderness. There is no rebound and no guarding.  Musculoskeletal: She exhibits no edema.  Lymphadenopathy:    She has no cervical adenopathy.  Neurological: She is alert and oriented to person, place, and time. She has normal patellar reflexes. She  exhibits normal muscle tone. Coordination normal.  Skin: Skin is warm and dry.  Psychiatric: She has a normal mood and affect. Her behavior is normal. Judgment and thought content normal.  Breasts: Examined lying Right: Without masses, retractions, discharge or axillary adenopathy.  Left: Without masses, retractions, discharge or axillary adenopathy.  Pelvic: deferred           Assessment & Plan:   Preventative care- discussed healthy diet, exercise and weight loss.  Will obtain baseline dexa scan. Pap up to date, immunizations up to date. Mammogram is ordered.  This visit occurred during the SARS-CoV-2 public health emergency.  Safety protocols were in place, including screening questions prior to the visit, additional usage of staff PPE, and extensive cleaning of exam room while observing appropriate contact time as indicated for disinfecting solutions.       Assessment & Plan:

## 2019-04-24 NOTE — Patient Instructions (Addendum)
Please complete lab work prior to leaving.    Preventive Care 40-53 Years Old, Female Preventive care refers to visits with your health care provider and lifestyle choices that can promote health and wellness. This includes:  A yearly physical exam. This may also be called an annual well check.  Regular dental visits and eye exams.  Immunizations.  Screening for certain conditions.  Healthy lifestyle choices, such as eating a healthy diet, getting regular exercise, not using drugs or products that contain nicotine and tobacco, and limiting alcohol use. What can I expect for my preventive care visit? Physical exam Your health care provider will check your:  Height and weight. This may be used to calculate body mass index (BMI), which tells if you are at a healthy weight.  Heart rate and blood pressure.  Skin for abnormal spots. Counseling Your health care provider may ask you questions about your:  Alcohol, tobacco, and drug use.  Emotional well-being.  Home and relationship well-being.  Sexual activity.  Eating habits.  Work and work environment.  Method of birth control.  Menstrual cycle.  Pregnancy history. What immunizations do I need?  Influenza (flu) vaccine  This is recommended every year. Tetanus, diphtheria, and pertussis (Tdap) vaccine  You may need a Td booster every 10 years. Varicella (chickenpox) vaccine  You may need this if you have not been vaccinated. Zoster (shingles) vaccine  You may need this after age 60. Measles, mumps, and rubella (MMR) vaccine  You may need at least one dose of MMR if you were born in 1957 or later. You may also need a second dose. Pneumococcal conjugate (PCV13) vaccine  You may need this if you have certain conditions and were not previously vaccinated. Pneumococcal polysaccharide (PPSV23) vaccine  You may need one or two doses if you smoke cigarettes or if you have certain conditions. Meningococcal conjugate  (MenACWY) vaccine  You may need this if you have certain conditions. Hepatitis A vaccine  You may need this if you have certain conditions or if you travel or work in places where you may be exposed to hepatitis A. Hepatitis B vaccine  You may need this if you have certain conditions or if you travel or work in places where you may be exposed to hepatitis B. Haemophilus influenzae type b (Hib) vaccine  You may need this if you have certain conditions. Human papillomavirus (HPV) vaccine  If recommended by your health care provider, you may need three doses over 6 months. You may receive vaccines as individual doses or as more than one vaccine together in one shot (combination vaccines). Talk with your health care provider about the risks and benefits of combination vaccines. What tests do I need? Blood tests  Lipid and cholesterol levels. These may be checked every 5 years, or more frequently if you are over 50 years old.  Hepatitis C test.  Hepatitis B test. Screening  Lung cancer screening. You may have this screening every year starting at age 55 if you have a 30-pack-year history of smoking and currently smoke or have quit within the past 15 years.  Colorectal cancer screening. All adults should have this screening starting at age 50 and continuing until age 75. Your health care provider may recommend screening at age 45 if you are at increased risk. You will have tests every 1-10 years, depending on your results and the type of screening test.  Diabetes screening. This is done by checking your blood sugar (glucose) after you have   not eaten for a while (fasting). You may have this done every 1-3 years.  Mammogram. This may be done every 1-2 years. Talk with your health care provider about when you should start having regular mammograms. This may depend on whether you have a family history of breast cancer.  BRCA-related cancer screening. This may be done if you have a family  history of breast, ovarian, tubal, or peritoneal cancers.  Pelvic exam and Pap test. This may be done every 3 years starting at age 21. Starting at age 30, this may be done every 5 years if you have a Pap test in combination with an HPV test. Other tests  Sexually transmitted disease (STD) testing.  Bone density scan. This is done to screen for osteoporosis. You may have this scan if you are at high risk for osteoporosis. Follow these instructions at home: Eating and drinking  Eat a diet that includes fresh fruits and vegetables, whole grains, lean protein, and low-fat dairy.  Take vitamin and mineral supplements as recommended by your health care provider.  Do not drink alcohol if: ? Your health care provider tells you not to drink. ? You are pregnant, may be pregnant, or are planning to become pregnant.  If you drink alcohol: ? Limit how much you have to 0-1 drink a day. ? Be aware of how much alcohol is in your drink. In the [place] one drink equals one 12 oz bottle of beer (355 mL), one 5 oz glass of wine (148 mL), or one 1 oz glass of hard liquor (44 mL). Lifestyle  Take daily care of your teeth and gums.  Stay active. Exercise for at least 30 minutes on 5 or more days each week.  Do not use any products that contain nicotine or tobacco, such as cigarettes, e-cigarettes, and chewing tobacco. If you need help quitting, ask your health care provider.  If you are sexually active, practice safe sex. Use a condom or other form of birth control (contraception) in order to prevent pregnancy and STIs (sexually transmitted infections).  If told by your health care provider, take low-dose aspirin daily starting at age 50. What's next?  Visit your health care provider once a year for a well check visit.  Ask your health care provider how often you should have your eyes and teeth checked.  Stay up to date on all vaccines. This information is not intended to replace advice given to you  by your health care provider. Make sure you discuss any questions you have with your health care provider. Document Revised: 10/10/2017 Document Reviewed: 10/10/2017 Elsevier Patient Education  2020 Elsevier Inc.  

## 2019-04-27 ENCOUNTER — Other Ambulatory Visit: Payer: Self-pay | Admitting: Family

## 2019-04-27 LAB — VITAMIN D 1,25 DIHYDROXY
Vitamin D 1, 25 (OH)2 Total: 41 pg/mL (ref 18–72)
Vitamin D2 1, 25 (OH)2: <8 pg/mL
Vitamin D3 1, 25 (OH)2: 41 pg/mL

## 2019-04-27 MED ORDER — ATORVASTATIN CALCIUM 20 MG PO TABS: 20.0000 mg | ORAL_TABLET | Freq: Every day | ORAL | 1 refills | Status: DC

## 2019-04-27 NOTE — Telephone Encounter (Signed)
Please advise pt that her cholesterol is very high. I would like her to add atorvastatin once daily and work on low fat diet, exercise, weight loss.  Repeat lipid panel in 6 weeks.

## 2019-04-27 NOTE — Telephone Encounter (Signed)
Results and provider's advise given to patient. She understands and agrees with taking statin, diet, exercise and weight loss. Rx sent.

## 2019-04-30 ENCOUNTER — Encounter: Payer: Self-pay | Admitting: Gastroenterology

## 2019-05-01 ENCOUNTER — Ambulatory Visit (HOSPITAL_BASED_OUTPATIENT_CLINIC_OR_DEPARTMENT_OTHER)
Admission: RE | Admit: 2019-05-01 | Discharge: 2019-05-01 | Disposition: A | Discharge status: Home / Self Care | Payer: BC Managed Care – PPO | Source: Ambulatory Visit | Attending: Family | Admitting: Family

## 2019-05-01 ENCOUNTER — Other Ambulatory Visit: Payer: Self-pay

## 2019-05-01 DIAGNOSIS — E348 Other specified endocrine disorders: Secondary | ICD-10-CM | POA: Insufficient documentation

## 2019-05-04 ENCOUNTER — Encounter: Payer: Self-pay | Admitting: Family

## 2019-05-04 ENCOUNTER — Other Ambulatory Visit: Payer: Self-pay | Admitting: Family

## 2019-05-11 ENCOUNTER — Other Ambulatory Visit: Payer: Self-pay

## 2019-05-11 ENCOUNTER — Ambulatory Visit (AMBULATORY_SURGERY_CENTER): Payer: Self-pay | Admitting: *Deleted

## 2019-05-11 VITALS — Temp 97.7°F | Ht 67.0 in | Wt 231.6 lb

## 2019-05-11 DIAGNOSIS — Z1211 Encounter for screening for malignant neoplasm of colon: Secondary | ICD-10-CM

## 2019-05-11 MED ORDER — SUPREP BOWEL PREP KIT 17.5-3.13-1.6 GM/177ML PO SOLN: 1.0000 | Freq: Once | ORAL | 0 refills | Status: AC

## 2019-05-11 NOTE — Progress Notes (Signed)
Patient denies any allergies to egg or soy products. Patient denies complications with anesthesia/sedation.  Patient denies oxygen use at home and denies diet medications. Emmi instructions for colonoscopy/endoscopy explained and given to patient.  Suprep coupon given at [facility] appt.  Patient had both covid vaccinations, last one on 03/20/19.

## 2019-05-25 ENCOUNTER — Ambulatory Visit (AMBULATORY_SURGERY_CENTER): Payer: BC Managed Care – PPO | Admitting: Gastroenterology

## 2019-05-25 ENCOUNTER — Encounter: Payer: Self-pay | Admitting: Gastroenterology

## 2019-05-25 ENCOUNTER — Other Ambulatory Visit: Payer: Self-pay

## 2019-05-25 VITALS — BP 110/69 | HR 66 | Temp 97.8°F | Resp 23 | Ht 67.0 in | Wt 231.0 lb

## 2019-05-25 DIAGNOSIS — K635 Polyp of colon: Secondary | ICD-10-CM

## 2019-05-25 DIAGNOSIS — D120 Benign neoplasm of cecum: Secondary | ICD-10-CM

## 2019-05-25 DIAGNOSIS — K6289 Other specified diseases of anus and rectum: Secondary | ICD-10-CM | POA: Diagnosis not present

## 2019-05-25 DIAGNOSIS — Z1211 Encounter for screening for malignant neoplasm of colon: Secondary | ICD-10-CM | POA: Diagnosis not present

## 2019-05-25 MED ORDER — SODIUM CHLORIDE 0.9 % IV SOLN: 500.0000 mL | Freq: Once | INTRAVENOUS | Status: DC

## 2019-05-25 NOTE — Progress Notes (Signed)
To PACU, VSS. Report to [email or web address] 

## 2019-05-25 NOTE — Op Note (Signed)
[facility] Patient Name: Diana Decker Procedure Date: 05/25/2019 2:45 PM MRN: [id number] Endoscopist: Gerrit Heck , MD Age: 54 Referring MD:  Date of Birth: 1965-02-16 Gender: Female Account #: [id number] Procedure:                Colonoscopy Indications:              Screening for colorectal malignant neoplasm. No Fhx                            of CRC and no active GI symptoms. Medicines:                Monitored Anesthesia Care Procedure:                Pre-Anesthesia Assessment:                           - Prior to the procedure, a History and Physical                            was performed, and patient medications and                            allergies were reviewed. The patient's tolerance of                            previous anesthesia was also reviewed. The risks                            and benefits of the procedure and the sedation                            options and risks were discussed with the patient.                            All questions were answered, and informed consent                            was obtained. Prior Anticoagulants: The patient has                            taken no previous anticoagulant or antiplatelet                            agents. ASA Grade Assessment: II - A patient with                            mild systemic disease. After reviewing the risks                            and benefits, the patient was deemed in                            satisfactory condition to undergo the procedure.  After obtaining informed consent, the colonoscope                            was passed under direct vision. Throughout the                            procedure, the patient's blood pressure, pulse, and                            oxygen saturations were monitored continuously. The                            Colonoscope was introduced through the anus and                            advanced to the the  terminal ileum. The colonoscopy                            was performed without difficulty. The patient                            tolerated the procedure well. The quality of the                            bowel preparation was excellent. The terminal                            ileum, ileocecal valve, appendiceal orifice, and                            rectum were photographed. Scope In: 2:52:09 PM Scope Out: 3:05:46 PM Scope Withdrawal Time: 0 hours 10 minutes 21 seconds  Total Procedure Duration: 0 hours 13 minutes 37 seconds  Findings:                 The perianal and digital rectal examinations were                            normal.                           A 3 mm polyp was found in the cecum. The polyp was                            sessile with adherent mucus cap. The polyp was                            removed with a cold snare. Resection and retrieval                            were complete. Estimated blood loss was minimal.                           Anal papilla(e) were hypertrophied.  The exam was otherwise normal throughout the                            examined colon.                           The terminal ileum appeared normal. Complications:            No immediate complications. Estimated Blood Loss:     Estimated blood loss was minimal. Impression:               - One 3 mm polyp in the cecum, removed with a cold                            snare. Resected and retrieved.                           - Anal papilla(e) were hypertrophied.                           - The examined portion of the ileum was normal. Recommendation:           - Patient has a contact number available for                            emergencies. The signs and symptoms of potential                            delayed complications were discussed with the                            patient. Return to normal activities tomorrow.                            Written discharge  instructions were provided to the                            patient.                           - Resume previous diet.                           - Continue present medications.                           - Await pathology results.                           - Repeat colonoscopy in 5-10 years for surveillance                            based on pathology results.                           - Return to GI office PRN. Gerrit Heck, MD 05/25/2019 3:09:39 PM

## 2019-05-25 NOTE — Progress Notes (Signed)
Pt's states no medical or surgical changes since previsit or office visit.  LC - temp KA - vitals 

## 2019-05-25 NOTE — Progress Notes (Signed)
Called to room to assist during endoscopic procedure.  Patient ID and intended procedure confirmed with present staff. Received instructions for my participation in the procedure from the performing physician.  

## 2019-05-25 NOTE — Patient Instructions (Signed)
   your care-partner. 

## 2019-05-27 ENCOUNTER — Telehealth: Payer: Self-pay

## 2019-05-27 ENCOUNTER — Telehealth: Payer: Self-pay | Admitting: *Deleted

## 2019-05-27 NOTE — Telephone Encounter (Signed)
No answer for post procedure call back. Left message for patient to call with questions or concerns. 

## 2019-05-27 NOTE — Telephone Encounter (Signed)
NO ANSWER, MESSAGE LEFT FOR PATIENT. 

## 2019-06-01 ENCOUNTER — Encounter: Payer: Self-pay | Admitting: Gastroenterology

## 2019-08-10 ENCOUNTER — Other Ambulatory Visit: Payer: Self-pay | Admitting: Family

## 2019-09-10 ENCOUNTER — Ambulatory Visit: Payer: BC Managed Care – PPO | Admitting: Psychiatry

## 2019-09-15 DIAGNOSIS — R519 Headache, unspecified: Secondary | ICD-10-CM | POA: Diagnosis not present

## 2019-09-15 DIAGNOSIS — Z20822 Contact with and (suspected) exposure to covid-19: Secondary | ICD-10-CM | POA: Diagnosis not present

## 2019-09-18 ENCOUNTER — Ambulatory Visit: Payer: BC Managed Care – PPO | Admitting: Psychiatry

## 2019-09-18 DIAGNOSIS — Z20822 Contact with and (suspected) exposure to covid-19: Secondary | ICD-10-CM | POA: Diagnosis not present

## 2019-10-02 ENCOUNTER — Ambulatory Visit: Payer: BC Managed Care – PPO | Admitting: Psychiatry

## 2019-10-16 ENCOUNTER — Other Ambulatory Visit: Payer: Self-pay

## 2019-10-16 ENCOUNTER — Encounter: Payer: Self-pay | Admitting: Psychiatry

## 2019-10-16 ENCOUNTER — Ambulatory Visit (INDEPENDENT_AMBULATORY_CARE_PROVIDER_SITE_OTHER): Payer: BC Managed Care – PPO | Admitting: Psychiatry

## 2019-10-16 DIAGNOSIS — F3176 Bipolar disorder, in full remission, most recent episode depressed: Secondary | ICD-10-CM | POA: Diagnosis not present

## 2019-10-16 DIAGNOSIS — F99 Mental disorder, not otherwise specified: Secondary | ICD-10-CM

## 2019-10-16 DIAGNOSIS — F5105 Insomnia due to other mental disorder: Secondary | ICD-10-CM

## 2019-10-16 MED ORDER — CARBAMAZEPINE ER 100 MG PO CP12: 100.0000 mg | ORAL_CAPSULE | Freq: Every day | ORAL | 0 refills | Status: DC

## 2019-10-16 NOTE — Progress Notes (Signed)
Diana Decker [id number] 1965-09-30 54 y.o.  Subjective:   Patient ID:  Diana Decker is a 54 y.o. (DOB 07/24/1965) female.  Chief Complaint:  Chief Complaint  Patient presents with  . Anxiety  . Follow-up    Bipolar D/O    HPI Diana Decker presents to the office today for follow-up of mood disturbance and anxiety. She reports that her job has recently been increasingly stressful. Friend recently attempted suicide. Also caregiving for her mother. Woke up in the middle of the night last night thinking about her job. Her sleep has been somewhat disturbed since stress increased. She has been trying to reduce caffeine since she had been drinking more caffeine recently. She reports that she did not drink any caffeine yesterday and had a severe HA. She is thinking about ways to reduce her stress and set some boundaries. Having some worry and thinking about how she is going to accomplish everything she needs to do.  She reports that her mood has been "ok." Reports occasional sad mood in response to stressors. Has had some occasional irritability. Has been practicing meditation and trying to increase compassion. Practicing lovingkindness meditation. She reports that she had period of manic s/s with excessive spending and this has decreased. Reports that she is in the bed for 8-9 hours. Having difficulty falling asleep and then having intermittent and early morning awakening. Estimates sleeping 5-6 hours total. Appetite has been ok other than eating foods that are not healthy, such as doughnuts or baked goods. Energy and motivation have been low. Reports that she does not want to walk and will walk with encouragement. Concentration is adequate. She reports that she has had some passing thoughts of "not wanting to deal." She reports chronic suicidal thoughts since 30. Reports passive death wishes. Denies suicidal intent or plan.   Saw Beckey Downing, [facility] individually in the past and then saw her for  couples counseling. Would like to resume couples counseling.   Mother's first cousin died last week and mother is grieving. Mother just turned 49.   Past Psychiatric Medication Trials: Latuda- Had restless legs on higher doses.Good response to 60 mg dose Equetro-effective for mood stabilizationwithout visual changes Carbamazepine(generic)-had vision changes and drowsiness Abilify-effective. Experience compulsive eating and spending. Weight gain Saphris Depakote-some weight gain Lithium-took briefly Serzone Trazodone Lamictal-caused insomnia Melatonin  AIMS     Office Visit from 09/12/2018 in [place] Total Score 0    PHQ2-9     Office Visit from 07/01/2017 in [facility] at [place] Visit from 05/29/2016 in [place] at [place]  PHQ-2 Total Score 0 4  PHQ-9 Total Score 2 13       Review of Systems:  Review of Systems  Musculoskeletal: Negative for gait problem.  Skin: Negative for rash.  Neurological: Negative for tremors.  Psychiatric/Behavioral:       Please refer to HPI    Medications: I have reviewed the patient's current medications.  Current Outpatient Medications  Medication Sig Dispense Refill  . atorvastatin (LIPITOR) 20 MG tablet TAKE 1 TABLET BY MOUTH EVERY DAY 90 tablet 1  . Cholecalciferol (VITAMIN D-3) 25 MCG (1000 UT) CAPS Take 1 capsule by mouth daily.    Marland Kitchen EQUETRO 200 MG CP12 12 hr capsule Take 2 capsules (400 mg total) by mouth 2 (two) times daily. 120 capsule 5  . levothyroxine (SYNTHROID) 50 MCG tablet TAKE 1 TABLET BY MOUTH EVERY DAY  BEFORE BREAKFAST 90 tablet 1  . Lurasidone HCl (LATUDA) 120 MG TABS Take 1/2-1 tab po q evening with a meal 90 tablet 1  . Multiple Vitamin (MULTIVITAMIN) tablet Take 1 tablet by mouth daily.    . valACYclovir (VALTREX) 1000 MG tablet TAKE 1/2 TABLET BY MOUTH EVERY DAY 45 tablet 1  . Carbamazepine (EQUETRO) 100 MG CP12 12  hr capsule Take 1 capsule (100 mg total) by mouth at bedtime. Take in addition to 400 mg BID 120 capsule 0   No current facility-administered medications for this visit.    Medication Side Effects: None  Allergies: No Known Allergies  Past Medical History:  Diagnosis Date  . Alcoholism ([place])    quit 09/23/1999  . Allergy   . Anemia   . Anxiety    patient denies this dx  . Bipolar 1 disorder ([place])   . Borderline personality disorder ([place])   . Depression   . GERD (gastroesophageal reflux disease)    no current problems  . Hyperlipidemia 08/19/2017  . Hypothyroidism 06/08/2010  . IBS (irritable bowel syndrome)   . 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 Breast cancer Mother   . Depression Mother   . Prostate cancer Father   . Heart disease Father   . Alcohol abuse Father   . Depression Father   . Anxiety disorder Father   . Depression Maternal Uncle   . Alcohol abuse Maternal Uncle   . Suicidality Maternal Uncle   . Alcohol abuse Half-Sister   . Bipolar disorder Half-Sister   . Colon cancer Neg Hx   . Rectal cancer Neg Hx   . Stomach cancer Neg Hx     Social History   Socioeconomic History  . Marital status: Married    Spouse name: Not on file  . Number of children: Not on file  . Years of education: Not on file  . Highest education level: Not on file  Occupational History  . Occupation: Hospital doctor  Tobacco Use  . Smoking status: Never Smoker  . Smokeless tobacco: Never Used  Vaping Use  . Vaping Use: Never used  Substance and Sexual Activity  . Alcohol use: No  . Drug use: No  . Sexual activity: Yes    Birth control/protection: None    Comment: husband with vasectomy  Other Topics Concern  . 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    Social Determinants of  Health   Financial Resource Strain:   . Difficulty of Paying Living Expenses: Not on file  Food Insecurity:   . Worried About Charity fundraiser in the Last Year: Not on file  . Ran Out of Food in the Last Year: Not on file  Transportation Needs:   . Lack of Transportation (Medical): Not on file  . Lack of Transportation (Non-Medical): Not on file  Physical Activity:   . Days of Exercise per Week: Not on file  . Minutes of Exercise per Session: Not on file  Stress:   . Feeling of Stress : Not on file  Social Connections:   . Frequency of Communication with Friends and Family: Not on file  . Frequency of Social Gatherings with Friends and Family: Not on file  . Attends Religious Services: Not on file  .  Active Member of Clubs or Organizations: Not on file  . Attends Archivist Meetings: Not on file  . Marital Status: Not on file  Intimate Partner Violence:   . Fear of Current or Ex-Partner: Not on file  . Emotionally Abused: Not on file  . Physically Abused: Not on file  . Sexually Abused: Not on file    Past Medical History, Surgical history, Social history, and Family history were reviewed and updated as appropriate.   Please see review of systems for further details on the patient's review from today.   Objective:   Physical Exam:  LMP 11/29/2015   Physical Exam Constitutional:      General: She is not in acute distress. Musculoskeletal:        General: No deformity.  Neurological:     Mental Status: She is alert and oriented to person, place, and time.     Coordination: Coordination normal.  Psychiatric:        Attention and Perception: Attention and perception normal. She does not perceive auditory or visual hallucinations.        Mood and Affect: Mood is anxious. Mood is not depressed. Affect is not labile, blunt, angry or inappropriate.        Speech: Speech normal.        Behavior: Behavior normal.        Thought Content: Thought content normal.  Thought content is not paranoid or delusional. Thought content does not include homicidal or suicidal ideation. Thought content does not include homicidal or suicidal plan.        Cognition and Memory: Cognition and memory normal.        Judgment: Judgment normal.     Comments: Insight intact     Lab Review:     Component Value Date/Time   NA 140 04/24/2019 1402   K 3.8 04/24/2019 1402   CL 103 04/24/2019 1402   CO2 31 04/24/2019 1402   GLUCOSE 112 (H) 04/24/2019 1402   BUN 14 04/24/2019 1402   CREATININE 0.74 04/24/2019 1402   CREATININE 0.64 03/18/2019 1450   CALCIUM 9.4 04/24/2019 1402   PROT 7.4 04/24/2019 1402   ALBUMIN 4.2 04/24/2019 1402   AST 12 04/24/2019 1402   ALT 13 04/24/2019 1402   ALKPHOS 89 04/24/2019 1402   BILITOT 0.2 04/24/2019 1402   GFRNONAA >90 07/11/2013 1511   GFRAA >90 07/11/2013 1511       Component Value Date/Time   WBC 5.0 04/24/2019 1402   RBC 3.86 (L) 04/24/2019 1402   HGB 12.6 04/24/2019 1402   HCT 37.6 04/24/2019 1402   PLT 281.0 04/24/2019 1402   MCV 97.3 04/24/2019 1402   MCH 32.7 03/18/2019 1450   MCHC 33.5 04/24/2019 1402   RDW 12.7 04/24/2019 1402   LYMPHSABS 2.1 04/24/2019 1402   MONOABS 0.4 04/24/2019 1402   EOSABS 0.2 04/24/2019 1402   BASOSABS 0.0 04/24/2019 1402    No results found for: POCLITH, LITHIUM   Lab Results  Component Value Date   CBMZ 8.0 03/18/2019     .res Assessment: Plan:   Will increase Equetro by 100 mg QHS to possibly improve mood and anxiety since pt reports some possible mixed s/s with sleep disturbance, low energy and motivation, mild irritability, impulsive eating, and cent manic s/s. Increase in [place] may also improve recent increase in anxiety. Discussed that she may need additional increase in dose since this is a slight increase in dose, however s/s have responded well  to slight adjustments in equetro in the past.  Agree with plan to avoid caffeine use after 2 pm to minimize sleep  disturbance. Continue Latuda 60 mg po QHS for mood s/s.  Agree with plan to resume therapy. Pt to f/u in 4 weeks or sooner if clinically indicated.  Patient advised to contact office with any questions, adverse effects, or acute worsening in signs and symptoms.  Diana Decker was seen today for anxiety and follow-up.  Diagnoses and all orders for this visit:  Bipolar disorder, in full remission, most recent episode depressed (HCC) -     Carbamazepine (EQUETRO) 100 MG CP12 12 hr capsule; Take 1 capsule (100 mg total) by mouth at bedtime. Take in addition to 400 mg BID  Insomnia due to other mental disorder     Please see After Visit Summary for patient specific instructions.  Future Appointments  Date Time Provider [place]  11/13/2019  8:00 AM Thayer Headings, PMHNP [facility] None    No orders of the defined types were placed in this encounter.   -------------------------------

## 2019-10-23 DIAGNOSIS — Z1231 Encounter for screening mammogram for malignant neoplasm of breast: Secondary | ICD-10-CM | POA: Diagnosis not present

## 2019-10-23 DIAGNOSIS — L292 Pruritus vulvae: Secondary | ICD-10-CM | POA: Diagnosis not present

## 2019-10-23 DIAGNOSIS — K635 Polyp of colon: Secondary | ICD-10-CM | POA: Insufficient documentation

## 2019-10-23 DIAGNOSIS — Z124 Encounter for screening for malignant neoplasm of cervix: Secondary | ICD-10-CM | POA: Diagnosis not present

## 2019-10-23 DIAGNOSIS — Z01419 Encounter for gynecological examination (general) (routine) without abnormal findings: Secondary | ICD-10-CM | POA: Diagnosis not present

## 2019-10-23 HISTORY — DX: Polyp of colon: K63.5

## 2019-10-23 LAB — HM PAP SMEAR: HM Pap smear: NEGATIVE

## 2019-10-23 LAB — HM MAMMOGRAPHY

## 2019-10-30 ENCOUNTER — Other Ambulatory Visit: Payer: Self-pay

## 2019-10-30 ENCOUNTER — Encounter: Payer: Self-pay | Admitting: Family

## 2019-10-30 ENCOUNTER — Ambulatory Visit (INDEPENDENT_AMBULATORY_CARE_PROVIDER_SITE_OTHER): Payer: BC Managed Care – PPO | Admitting: Family

## 2019-10-30 VITALS — BP 136/82 | HR 60 | Temp 98.3°F | Resp 16 | Ht 67.0 in | Wt 228.0 lb

## 2019-10-30 DIAGNOSIS — E010 Iodine-deficiency related diffuse (endemic) goiter: Secondary | ICD-10-CM

## 2019-10-30 DIAGNOSIS — Z23 Encounter for immunization: Secondary | ICD-10-CM

## 2019-10-30 DIAGNOSIS — E039 Hypothyroidism, unspecified: Secondary | ICD-10-CM

## 2019-10-30 NOTE — Patient Instructions (Signed)
Please complete lab work prior to leaving. You should be contacted about scheduling your thyroid ultrasound in imaging.

## 2019-10-30 NOTE — Progress Notes (Signed)
Subjective:    Patient ID: Diana Decker, female    DOB: 1965/09/18, 54 y.o.   MRN: [id number]  HPI   Patient is a 54 yr old female who presents today at the recommendation of her GYN due to concern about thyromegaly.  Lab Results  Component Value Date   TSH 3.07 03/18/2019       Review of Systems See HPI  Past Medical History:  Diagnosis Date  . Alcoholism ([place])    quit 09/23/1999  . Allergy   . Anemia   . Anxiety    patient denies this dx  . Bipolar 1 disorder ([place])   . Borderline personality disorder ([place])   . Depression   . GERD (gastroesophageal reflux disease)    no current problems  . Hyperlipidemia 08/19/2017  . Hypothyroidism 06/08/2010  . IBS (irritable bowel syndrome)   . 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 Marital status: Married    Spouse name: Not on file  . Number of children: Not on file  . Years of education: Not on file  . Highest education level: Not on file  Occupational History  . Occupation: Hospital doctor  Tobacco Use  . Smoking status: Never Smoker  . Smokeless tobacco: Never Used  Vaping Use  . Vaping Use: Never used  Substance and Sexual Activity  . Alcohol use: No  . Drug use: No  . Sexual activity: Yes    Birth control/protection: None    Comment: husband with vasectomy  Other Topics Concern  . 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    Social Determinants of Health   Financial Resource Strain:   . Difficulty of Paying Living Expenses: Not on file  Food Insecurity:   . Worried About Charity fundraiser in the Last Year: Not on file  . Ran Out of Food in the Last Year: Not on file  Transportation Needs:   . Lack of Transportation (Medical): Not on file  . Lack of Transportation (Non-Medical): Not on file  Physical Activity:   .  Days of Exercise per Week: Not on file  . Minutes of Exercise per Session: Not on file  Stress:   . Feeling of Stress : Not on file  Social Connections:   . Frequency of Communication with Friends and Family: Not on file  . Frequency of Social Gatherings with Friends and Family: Not on file  . Attends Religious Services: Not on file  . Active Member of Clubs or Organizations: Not on file  . Attends Archivist Meetings: Not on file  . Marital Status: Not on file  Intimate Partner Violence:   . Fear of Current or Ex-Partner: Not on file  . Emotionally Abused: Not on file  . Physically Abused: Not on file  . Sexually Abused: Not on file    Past Surgical History:  Procedure Laterality Date  . APPENDECTOMY    . COLONOSCOPY  05/03/2009   Normal - [facility]  . EYE SURGERY Left   . FOOT SURGERY Right   . HERNIA REPAIR     inguinal  . SQUAMOUS CELL CARCINOMA EXCISION Left    Mohs procedure  . UPPER GASTROINTESTINAL ENDOSCOPY  05/03/2009   [facility]  . WISDOM TOOTH EXTRACTION  Family History  Problem Relation Age of Onset  . Breast cancer Mother   . Depression Mother   . Prostate cancer Father   . Heart disease Father   . Alcohol abuse Father   . Depression Father   . Anxiety disorder Father   . Depression Maternal Uncle   . Alcohol abuse Maternal Uncle   . Suicidality Maternal Uncle   . Alcohol abuse Half-Sister   . Bipolar disorder Half-Sister   . Colon cancer Neg Hx   . Rectal cancer Neg Hx   . Stomach cancer Neg Hx     No Known Allergies  Current Outpatient Medications on File Prior to Visit  Medication Sig Dispense Refill  . atorvastatin (LIPITOR) 20 MG tablet TAKE 1 TABLET BY MOUTH EVERY DAY 90 tablet 1  . Carbamazepine (EQUETRO) 100 MG CP12 12 hr capsule Take 1 capsule (100 mg total) by mouth at bedtime. Take in addition to 400 mg BID 120 capsule 0  . Cholecalciferol (VITAMIN D-3) 25 MCG (1000 UT) CAPS Take 1 capsule by mouth daily.    Marland Kitchen EQUETRO  200 MG CP12 12 hr capsule Take 2 capsules (400 mg total) by mouth 2 (two) times daily. 120 capsule 5  . levothyroxine (SYNTHROID) 50 MCG tablet TAKE 1 TABLET BY MOUTH EVERY DAY BEFORE BREAKFAST 90 tablet 1  . Lurasidone HCl (LATUDA) 120 MG TABS Take 1/2-1 tab po q evening with a meal 90 tablet 1  . Multiple Vitamin (MULTIVITAMIN) tablet Take 1 tablet by mouth daily.    . valACYclovir (VALTREX) 1000 MG tablet TAKE 1/2 TABLET BY MOUTH EVERY DAY 45 tablet 1   No current facility-administered medications on file prior to visit.    BP 136/82 (BP Location: Right Arm, Patient Position: Sitting, Cuff Size: Large)   Pulse 60   Temp 98.3 F (36.8 C) (Oral)   Resp 16   Ht 5\' 7"  (1.702 m)   Wt 228 lb (103.4 kg)   LMP 11/29/2015   SpO2 99%   BMI 35.71 kg/m       Objective:   Physical Exam Constitutional:      Appearance: She is well-developed.  Neck:     Thyroid: Thyromegaly (L>R) present.  Cardiovascular:     Rate and Rhythm: Normal rate and regular rhythm.     Heart sounds: Normal heart sounds. No murmur heard.   Pulmonary:     Effort: Pulmonary effort is normal. No respiratory distress.     Breath sounds: Normal breath sounds. No wheezing.  Musculoskeletal:     Cervical back: Neck supple.  Skin:    General: Skin is warm and dry.  Neurological:     Mental Status: She is alert and oriented to person, place, and time.  Psychiatric:        Behavior: Behavior normal.        Thought Content: Thought content normal.        Judgment: Judgment normal.           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 Had [place] back 2017. Will obtain thyroid US for further evaluation.   Hypothyroid- Check follow up tft's, continue synthroid.  Flu shot today.  This visit occurred during the SARS-CoV-2 public health emergency.  Safety protocols were in place, including screening questions prior to the visit, additional  usage of staff PPE, and extensive cleaning of exam room while observing appropriate contact time as  indicated for disinfecting solutions.

## 2019-10-31 LAB — T4, FREE: Free T4: 0.8 ng/dL (ref 0.8–1.8)

## 2019-10-31 LAB — T3, FREE: T3, Free: 2.6 pg/mL (ref 2.3–4.2)

## 2019-10-31 LAB — TSH: TSH: 3.43 mIU/L

## 2019-11-04 ENCOUNTER — Other Ambulatory Visit: Payer: Self-pay | Admitting: Family

## 2019-11-10 ENCOUNTER — Ambulatory Visit (HOSPITAL_BASED_OUTPATIENT_CLINIC_OR_DEPARTMENT_OTHER): Payer: BC Managed Care – PPO

## 2019-11-12 ENCOUNTER — Telehealth: Payer: Self-pay | Admitting: Family

## 2019-11-12 ENCOUNTER — Other Ambulatory Visit: Payer: Self-pay

## 2019-11-12 ENCOUNTER — Ambulatory Visit (INDEPENDENT_AMBULATORY_CARE_PROVIDER_SITE_OTHER): Payer: BC Managed Care – PPO

## 2019-11-12 ENCOUNTER — Ambulatory Visit (HOSPITAL_BASED_OUTPATIENT_CLINIC_OR_DEPARTMENT_OTHER): Payer: BC Managed Care – PPO

## 2019-11-12 DIAGNOSIS — E039 Hypothyroidism, unspecified: Secondary | ICD-10-CM

## 2019-11-12 DIAGNOSIS — E010 Iodine-deficiency related diffuse (endemic) goiter: Secondary | ICD-10-CM | POA: Diagnosis not present

## 2019-11-12 NOTE — Telephone Encounter (Signed)
lvm for patient to call back about her us results.  °

## 2019-11-12 NOTE — Telephone Encounter (Signed)
US shows thyroid is enlarged but no concerning nodules.  Recommend repeat TSH, free t3 and free t4 in 3 months, dx thyroiditis

## 2019-11-13 ENCOUNTER — Ambulatory Visit: Payer: BC Managed Care – PPO | Admitting: Psychiatry

## 2019-11-16 ENCOUNTER — Other Ambulatory Visit: Payer: Self-pay

## 2019-11-16 DIAGNOSIS — E069 Thyroiditis, unspecified: Secondary | ICD-10-CM

## 2019-11-16 NOTE — Telephone Encounter (Signed)
Patient has not call back about voice messages, MyChart message sent today with this information and for patient to call back to schedule 3 months labs, orders entered as future.

## 2019-12-03 ENCOUNTER — Other Ambulatory Visit: Payer: Self-pay | Admitting: Psychiatry

## 2019-12-03 DIAGNOSIS — F5105 Insomnia due to other mental disorder: Secondary | ICD-10-CM

## 2019-12-03 DIAGNOSIS — F99 Mental disorder, not otherwise specified: Secondary | ICD-10-CM

## 2019-12-03 DIAGNOSIS — F3176 Bipolar disorder, in full remission, most recent episode depressed: Secondary | ICD-10-CM

## 2019-12-11 DIAGNOSIS — Z713 Dietary counseling and surveillance: Secondary | ICD-10-CM | POA: Diagnosis not present

## 2019-12-18 ENCOUNTER — Ambulatory Visit (INDEPENDENT_AMBULATORY_CARE_PROVIDER_SITE_OTHER): Payer: BC Managed Care – PPO | Admitting: Psychiatry

## 2019-12-18 ENCOUNTER — Other Ambulatory Visit: Payer: Self-pay

## 2019-12-18 ENCOUNTER — Encounter: Payer: Self-pay | Admitting: Psychiatry

## 2019-12-18 DIAGNOSIS — F5105 Insomnia due to other mental disorder: Secondary | ICD-10-CM | POA: Diagnosis not present

## 2019-12-18 DIAGNOSIS — F3176 Bipolar disorder, in full remission, most recent episode depressed: Secondary | ICD-10-CM

## 2019-12-18 DIAGNOSIS — F99 Mental disorder, not otherwise specified: Secondary | ICD-10-CM | POA: Diagnosis not present

## 2019-12-18 MED ORDER — EQUETRO 200 MG PO CP12: 400.0000 mg | ORAL_CAPSULE | Freq: Two times a day (BID) | ORAL | 2 refills | Status: DC

## 2019-12-18 MED ORDER — LATUDA 120 MG PO TABS: ORAL_TABLET | ORAL | 1 refills | Status: DC

## 2019-12-18 NOTE — Progress Notes (Signed)
Diana Decker [id number] [birth year] 54 y.o.  Subjective:   Patient ID:  Diana Decker is a 54 y.o. (DOB 1965/11/13) female.  Chief Complaint:  Chief Complaint  Patient presents with  . Follow-up    Mood disturbance, anxiety    HPI Diana Decker presents to the office today for follow-up of mood disturbance and anxiety. She reports that she briefly took additional 100 mg of Equetro. She does not recall why she quit taking higher dose other than it did not seem to make a difference. She reports cutting back caffeine seems to be helping with both sleep and anxiety. No longer has caffeine after 2 pm.   She reports that her job has been "super-stressful." Reports that her work load can vary significantly. She reports that it is stressful when callers are demanding or frustrated. She reports some rumination about work. Denies panic.   Goes to bed around 9 pm and awakens around 6 am. No longer having multiple awakenings during the night and only awakening 1-2 times a night. Mood has been "ok." She reports that she typically has some mild depression with the start of winter- "but not anything bad." Some mild irritability when stressors are higher. She has had some impulsive spending and this is more than usual since the start of the pandemic. Denies any other manic s/s. She reports that she went through a period of time where she did not want to walk with her husband as usual for a couple of weeks and is now back to wanting to be more active. Concentration has been adequate. She reports some vague passive death wishes without suicidal intent or plan.  Met with a nutritionist regarding high cholesterol and weight management. Nutritionist gave her some tips around impulse eating. Discussed other behaviors to replace eating and reduce stress. Husband cooks their meals and she relayed nutrition information to husband.   Past Psychiatric Medication Trials: Latuda- Had restless legs on higher  doses.Good response to 60 mg dose Equetro-effective for mood stabilizationwithout visual changes Carbamazepine(generic)-had vision changes and drowsiness Abilify-effective. Experience compulsive eating and spending. Weight gain Saphris Depakote-some weight gain Lithium-took briefly Serzone Trazodone Lamictal-caused insomnia Melatonin  AIMS     Office Visit from 09/12/2018 in [place] Total Score 0    PHQ2-9     Office Visit from 07/01/2017 in [facility] at [place] Visit from 05/29/2016 in [place] at [place]  PHQ-2 Total Score 0 4  PHQ-9 Total Score 2 13       Review of Systems:  Review of Systems  Gastrointestinal: Negative.   Musculoskeletal: Negative for gait problem.  Neurological: Negative for tremors.  Psychiatric/Behavioral:       Please refer to HPI    Medications: I have reviewed the patient's current medications.  Current Outpatient Medications  Medication Sig Dispense Refill  . atorvastatin (LIPITOR) 20 MG tablet TAKE 1 TABLET BY MOUTH EVERY DAY 90 tablet 1  . EQUETRO 200 MG CP12 12 hr capsule Take 2 capsules (400 mg total) by mouth 2 (two) times daily. 120 capsule 2  . levothyroxine (SYNTHROID) 50 MCG tablet TAKE 1 TABLET BY MOUTH EVERY DAY BEFORE BREAKFAST 90 tablet 1  . Lurasidone HCl (LATUDA) 120 MG TABS Take 1/2-1 tab po q evening with a meal 90 tablet 1  . Multiple Vitamin (MULTIVITAMIN) tablet Take 1 tablet by mouth daily.    . Omega-3 1000 MG CAPS Take by  mouth.    . valACYclovir (VALTREX) 1000 MG tablet TAKE 1/2 TABLET BY MOUTH EVERY DAY 45 tablet 1   No current facility-administered medications for this visit.    Medication Side Effects: None  Allergies: No Known Allergies  Past Medical History:  Diagnosis Date  . Alcoholism ([place])    quit 09/23/1999  . Allergy   . Anemia   . Anxiety    patient denies this dx  . Bipolar 1 disorder ([place])    . Borderline personality disorder ([place])   . Depression   . GERD (gastroesophageal reflux disease)    no current problems  . Hyperlipidemia 08/19/2017  . Hypothyroidism 06/08/2010  . IBS (irritable bowel syndrome)   . 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 Breast cancer Mother   . Depression Mother   . Prostate cancer Father   . Heart disease Father   . Alcohol abuse Father   . Depression Father   . Anxiety disorder Father   . Depression Maternal Uncle   . Alcohol abuse Maternal Uncle   . Suicidality Maternal Uncle   . Alcohol abuse Half-Sister   . Bipolar disorder Half-Sister   . Colon cancer Neg Hx   . Rectal cancer Neg Hx   . Stomach cancer Neg Hx     Social History   Socioeconomic History  . Marital status: Married    Spouse name: Not on file  . Number of children: Not on file  . Years of education: Not on file  . Highest education level: Not on file  Occupational History  . Occupation: Hospital doctor  Tobacco Use  . Smoking status: Never Smoker  . Smokeless tobacco: Never Used  Vaping Use  . Vaping Use: Never used  Substance and Sexual Activity  . Alcohol use: No  . Drug use: No  . Sexual activity: Yes    Birth control/protection: None    Comment: husband with vasectomy  Other Topics Concern  . 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    Social Determinants of Health   Financial Resource Strain:   . Difficulty of Paying Living Expenses: Not on file  Food Insecurity:   . Worried About Charity fundraiser in the Last Year: Not on file  . Ran Out of Food in the Last Year: Not on file  Transportation Needs:   . Lack of Transportation (Medical): Not on file  . Lack of Transportation (Non-Medical): Not on file  Physical Activity:   . Days of Exercise per Week: Not  on file  . Minutes of Exercise per Session: Not on file  Stress:   . Feeling of Stress : Not on file  Social Connections:   . Frequency of Communication with Friends and Family: Not on file  . Frequency of Social Gatherings with Friends and Family: Not on file  . Attends Religious Services: Not on file  . Active Member of Clubs or Organizations: Not on file  . Attends Archivist Meetings: Not on file  . Marital Status: Not on file  Intimate Partner Violence:   . Fear of Current or Ex-Partner: Not on file  . Emotionally Abused: Not on file  . Physically Abused: Not on file  . Sexually Abused: Not on file  Past Medical History, Surgical history, Social history, and Family history were reviewed and updated as appropriate.   Please see review of systems for further details on the patient's review from today.   Objective:   Physical Exam:  LMP 11/29/2015   Physical Exam Constitutional:      General: She is not in acute distress. Musculoskeletal:        General: No deformity.  Neurological:     Mental Status: She is alert and oriented to person, place, and time.     Coordination: Coordination normal.  Psychiatric:        Attention and Perception: Attention and perception normal. She does not perceive auditory or visual hallucinations.        Mood and Affect: Mood normal. Mood is not anxious or depressed. Affect is not labile, blunt, angry or inappropriate.        Speech: Speech normal.        Behavior: Behavior normal.        Thought Content: Thought content normal. Thought content is not paranoid or delusional. Thought content does not include homicidal or suicidal ideation. Thought content does not include homicidal or suicidal plan.        Cognition and Memory: Cognition and memory normal.        Judgment: Judgment normal.     Comments: Insight intact     Lab Review:     Component Value Date/Time   NA 140 04/24/2019 1402   K 3.8 04/24/2019 1402   CL 103  04/24/2019 1402   CO2 31 04/24/2019 1402   GLUCOSE 112 (H) 04/24/2019 1402   BUN 14 04/24/2019 1402   CREATININE 0.74 04/24/2019 1402   CREATININE 0.64 03/18/2019 1450   CALCIUM 9.4 04/24/2019 1402   PROT 7.4 04/24/2019 1402   ALBUMIN 4.2 04/24/2019 1402   AST 12 04/24/2019 1402   ALT 13 04/24/2019 1402   ALKPHOS 89 04/24/2019 1402   BILITOT 0.2 04/24/2019 1402   GFRNONAA >90 07/11/2013 1511   GFRAA >90 07/11/2013 1511       Component Value Date/Time   WBC 5.0 04/24/2019 1402   RBC 3.86 (L) 04/24/2019 1402   HGB 12.6 04/24/2019 1402   HCT 37.6 04/24/2019 1402   PLT 281.0 04/24/2019 1402   MCV 97.3 04/24/2019 1402   MCH 32.7 03/18/2019 1450   MCHC 33.5 04/24/2019 1402   RDW 12.7 04/24/2019 1402   LYMPHSABS 2.1 04/24/2019 1402   MONOABS 0.4 04/24/2019 1402   EOSABS 0.2 04/24/2019 1402   BASOSABS 0.0 04/24/2019 1402    No results found for: POCLITH, LITHIUM   Lab Results  Component Value Date   CBMZ 8.0 03/18/2019     .res Assessment: Plan:   Pt reports that her mood, anxiety, and sleep has improved since last visit and feels that reducing caffeine later in the day has been helpful for these s/s. She reports that mood, anxiety, and insomnia s/s are now adequately controlled. Will therefore continue current medications.  Continue Equetro 400 mg po BID for mood stabilization.  Continue Latuda 120 mg 1/2-1 tab po q evening with a meal.  Pt to follow-up in 6 months or sooner if clinically indicated.  Patient advised to contact office with any questions, adverse effects, or acute worsening in signs and symptoms.  Jaqueline was seen today for follow-up.  Diagnoses and all orders for this visit:  Bipolar disorder, in full remission, most recent episode depressed ([place]) -     EQUETRO 200  MG CP12 12 hr capsule; Take 2 capsules (400 mg total) by mouth 2 (two) times daily. -     Lurasidone HCl (LATUDA) 120 MG TABS; Take 1/2-1 tab po q evening with a meal  Insomnia due to other  mental disorder -     EQUETRO 200 MG CP12 12 hr capsule; Take 2 capsules (400 mg total) by mouth 2 (two) times daily.     Please see After Visit Summary for patient specific instructions.  Future Appointments  Date Time Provider [place]  06/17/2020  8:30 AM Thayer Headings, PMHNP CP-CP None    No orders of the defined types were placed in this encounter.   -------------------------------

## 2019-12-25 DIAGNOSIS — Z713 Dietary counseling and surveillance: Secondary | ICD-10-CM | POA: Diagnosis not present

## 2020-01-15 DIAGNOSIS — Z713 Dietary counseling and surveillance: Secondary | ICD-10-CM | POA: Diagnosis not present

## 2020-02-23 DIAGNOSIS — Z85828 Personal history of other malignant neoplasm of skin: Secondary | ICD-10-CM | POA: Diagnosis not present

## 2020-02-23 DIAGNOSIS — L814 Other melanin hyperpigmentation: Secondary | ICD-10-CM | POA: Diagnosis not present

## 2020-02-23 DIAGNOSIS — D2271 Melanocytic nevi of right lower limb, including hip: Secondary | ICD-10-CM | POA: Diagnosis not present

## 2020-02-23 DIAGNOSIS — L821 Other seborrheic keratosis: Secondary | ICD-10-CM | POA: Diagnosis not present

## 2020-02-26 DIAGNOSIS — F4320 Adjustment disorder, unspecified: Secondary | ICD-10-CM | POA: Diagnosis not present

## 2020-02-27 DIAGNOSIS — Z1152 Encounter for screening for COVID-19: Secondary | ICD-10-CM | POA: Diagnosis not present

## 2020-03-04 ENCOUNTER — Telehealth: Payer: Self-pay | Admitting: Psychiatry

## 2020-03-04 DIAGNOSIS — F3176 Bipolar disorder, in full remission, most recent episode depressed: Secondary | ICD-10-CM

## 2020-03-04 MED ORDER — LATUDA 120 MG PO TABS: ORAL_TABLET | ORAL | 0 refills | Status: DC

## 2020-03-04 NOTE — Telephone Encounter (Signed)
Next appt is 06/17/20. Diana Decker is currently in [place], [place] and forgot to pack her Latuda with her. [facility] [address], Phone # is [phone] only has 60 mg tablets. [name] just needs 3 pills. Pharmacist said if you can call them and authorize over the phone that is all they would need or you can fax a RX in to [phone].

## 2020-03-04 NOTE — Telephone Encounter (Signed)
Script sent  

## 2020-03-12 DIAGNOSIS — F4320 Adjustment disorder, unspecified: Secondary | ICD-10-CM | POA: Diagnosis not present

## 2020-03-18 DIAGNOSIS — F4320 Adjustment disorder, unspecified: Secondary | ICD-10-CM | POA: Diagnosis not present

## 2020-03-26 DIAGNOSIS — F4320 Adjustment disorder, unspecified: Secondary | ICD-10-CM | POA: Diagnosis not present

## 2020-04-02 DIAGNOSIS — F4320 Adjustment disorder, unspecified: Secondary | ICD-10-CM | POA: Diagnosis not present

## 2020-04-04 DIAGNOSIS — H04123 Dry eye syndrome of bilateral lacrimal glands: Secondary | ICD-10-CM | POA: Diagnosis not present

## 2020-04-04 DIAGNOSIS — H524 Presbyopia: Secondary | ICD-10-CM | POA: Diagnosis not present

## 2020-04-04 DIAGNOSIS — H2513 Age-related nuclear cataract, bilateral: Secondary | ICD-10-CM | POA: Diagnosis not present

## 2020-04-04 DIAGNOSIS — H5213 Myopia, bilateral: Secondary | ICD-10-CM | POA: Diagnosis not present

## 2020-04-05 ENCOUNTER — Other Ambulatory Visit: Payer: Self-pay | Admitting: Family

## 2020-04-15 DIAGNOSIS — F4320 Adjustment disorder, unspecified: Secondary | ICD-10-CM | POA: Diagnosis not present

## 2020-04-29 DIAGNOSIS — F4320 Adjustment disorder, unspecified: Secondary | ICD-10-CM | POA: Diagnosis not present

## 2020-05-08 ENCOUNTER — Other Ambulatory Visit: Payer: Self-pay | Admitting: Family

## 2020-05-12 ENCOUNTER — Other Ambulatory Visit: Payer: Self-pay | Admitting: Psychiatry

## 2020-05-12 ENCOUNTER — Other Ambulatory Visit: Payer: Self-pay | Admitting: Family

## 2020-05-12 DIAGNOSIS — F3176 Bipolar disorder, in full remission, most recent episode depressed: Secondary | ICD-10-CM

## 2020-05-27 DIAGNOSIS — F4320 Adjustment disorder, unspecified: Secondary | ICD-10-CM | POA: Diagnosis not present

## 2020-06-09 ENCOUNTER — Other Ambulatory Visit: Payer: Self-pay | Admitting: Psychiatry

## 2020-06-09 DIAGNOSIS — F3176 Bipolar disorder, in full remission, most recent episode depressed: Secondary | ICD-10-CM

## 2020-06-09 DIAGNOSIS — F5105 Insomnia due to other mental disorder: Secondary | ICD-10-CM

## 2020-06-13 DIAGNOSIS — S83242A Other tear of medial meniscus, current injury, left knee, initial encounter: Secondary | ICD-10-CM | POA: Diagnosis not present

## 2020-06-17 ENCOUNTER — Encounter: Payer: Self-pay | Admitting: Psychiatry

## 2020-06-17 ENCOUNTER — Ambulatory Visit (INDEPENDENT_AMBULATORY_CARE_PROVIDER_SITE_OTHER): Payer: BC Managed Care – PPO | Admitting: Psychiatry

## 2020-06-17 ENCOUNTER — Other Ambulatory Visit: Payer: Self-pay

## 2020-06-17 VITALS — Wt 220.0 lb

## 2020-06-17 DIAGNOSIS — F3176 Bipolar disorder, in full remission, most recent episode depressed: Secondary | ICD-10-CM

## 2020-06-17 MED ORDER — CARBAMAZEPINE ER 200 MG PO CP12: 400.0000 mg | ORAL_CAPSULE | Freq: Two times a day (BID) | ORAL | 5 refills | Status: DC

## 2020-06-17 MED ORDER — LATUDA 120 MG PO TABS: ORAL_TABLET | ORAL | 0 refills | Status: DC

## 2020-06-17 NOTE — Progress Notes (Signed)
Diana Decker [id number] 01-12-1966 55 y.o.  Subjective:   Patient ID:  Diana Decker is a 55 y.o. (DOB 1965/02/22) female.  Chief Complaint:  Chief Complaint  Patient presents with  . Follow-up    Mood disturbance, anxiety    HPI Diana Decker presents to the office today for follow-up of mood disturbance and anxiety. She reports that she received a notice from her insurance that states they will no longer cover Equetro. She reports that she had a negative interaction with a coworker this week "that has been difficult to shake." She reports that her job has been stressful. She reports that she has received positive feedback from supervisor. She reports that she has been exercising often and this helped with her mood. Denies any manic s/s. Sleep is ok overall "as long as I am not stressed about work." She reports some work related anxiety. She reports some perfectionistic tendencies. Appetite has been ok. She has been trying to lose weight. Denies binge eating. Reports that for the last 1-2 weeks she has made some poor food choices. Occasionally comforting herself with food. She reports feeling tired frequently. Concentration is adequate. Denies SI.   She is doing intakes and case management.   She is seeing a new therapist that is working on Adult Child of Alcoholics.   Past Psychiatric Medication Trials: Latuda- Had restless legs on higher doses.Good response to 60 mg dose Equetro-effective for mood stabilizationwithout visual changes Carbamazepine(generic)-had vision changes and drowsiness Abilify-effective. Experience compulsive eating and spending. Weight gain Saphris Depakote-some weight gain Lithium-took briefly Serzone Trazodone Lamictal-caused insomnia Melatonin   AIMS   Flowsheet Row Office Visit from 09/12/2018 in [place] Total Score 0    PHQ2-9   [place] Office Visit from 07/01/2017 in [facility] [place] Visit from 05/29/2016 in [place] at [place]  PHQ-2 Total Score 0 4  PHQ-9 Total Score 2 13       Review of Systems:  Review of Systems  Musculoskeletal: Negative for gait problem.       Knee pain  Skin: Negative for rash.  Neurological: Negative for tremors.  Psychiatric/Behavioral:       Please refer to HPI    Medications: I have reviewed the patient's current medications.  Current Outpatient Medications  Medication Sig Dispense Refill  . atorvastatin (LIPITOR) 20 MG tablet TAKE 1 TABLET BY MOUTH EVERY DAY 90 tablet 0  . carbamazepine (EQUETRO) 200 MG CP12 12 hr capsule Take 2 capsules (400 mg total) by mouth 2 (two) times daily. 120 capsule 5  . levothyroxine (SYNTHROID) 50 MCG tablet TAKE 1 TABLET BY MOUTH EVERY DAY BEFORE BREAKFAST 90 tablet 1  . Multiple Vitamin (MULTIVITAMIN) tablet Take 1 tablet by mouth daily.    . Omega-3 1000 MG CAPS Take by mouth.    . valACYclovir (VALTREX) 1000 MG tablet TAKE 1/2 TABLET BY MOUTH EVERY DAY 45 tablet 1  . Lurasidone HCl (LATUDA) 120 MG TABS TAKE 1/2-1 TAB EVERY EVENING WITH A MEAL 90 tablet 0   No current facility-administered medications for this visit.    Medication Side Effects: None  Allergies: No Known Allergies  Past Medical History:  Diagnosis Date  . Alcoholism ([place])    quit 09/23/1999  . Allergy   . Anemia   . Anxiety    patient denies this dx  . Bipolar 1 disorder ([place])   . Borderline personality disorder (  [place])   . Depression   . GERD (gastroesophageal reflux disease)    no current problems  . Hyperlipidemia 08/19/2017  . Hypothyroidism 06/08/2010  . IBS (irritable bowel syndrome)   . 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   Please see review of systems for further details on the patient's review from  today.   Objective:   Physical Exam:  Wt 220 lb (99.8 kg)   LMP 11/29/2015   BMI 34.46 kg/m   Physical Exam Constitutional:      General: She is not in acute distress. Musculoskeletal:        General: No deformity.  Neurological:     Mental Status: She is alert and oriented to person, place, and time.     Coordination: Coordination normal.  Psychiatric:        Attention and Perception: Attention and perception normal. She does not perceive auditory or visual hallucinations.        Mood and Affect: Mood normal. Mood is not anxious or depressed. Affect is not labile, blunt, angry or inappropriate.        Speech: Speech normal.        Behavior: Behavior normal.        Thought Content: Thought content normal. Thought content is not paranoid or delusional. Thought content does not include homicidal or suicidal ideation. Thought content does not include homicidal or suicidal plan.        Cognition and Memory: Cognition and memory normal.        Judgment: Judgment normal.     Comments: Insight intact     Lab Review:     Component Value Date/Time   NA 140 04/24/2019 1402   K 3.8 04/24/2019 1402   CL 103 04/24/2019 1402   CO2 31 04/24/2019 1402   GLUCOSE 112 (H) 04/24/2019 1402   BUN 14 04/24/2019 1402   CREATININE 0.74 04/24/2019 1402   CREATININE 0.64 03/18/2019 1450   CALCIUM 9.4 04/24/2019 1402   PROT 7.4 04/24/2019 1402   ALBUMIN 4.2 04/24/2019 1402   AST 12 04/24/2019 1402   ALT 13 04/24/2019 1402   ALKPHOS 89 04/24/2019 1402   BILITOT 0.2 04/24/2019 1402   GFRNONAA >90 07/11/2013 1511   GFRAA >90 07/11/2013 1511       Component Value Date/Time   WBC 5.0 04/24/2019 1402   RBC 3.86 (L) 04/24/2019 1402   HGB 12.6 04/24/2019 1402   HCT 37.6 04/24/2019 1402   PLT 281.0 04/24/2019 1402   MCV 97.3 04/24/2019 1402   MCH 32.7 03/18/2019 1450   MCHC 33.5 04/24/2019 1402   RDW 12.7 04/24/2019 1402   LYMPHSABS 2.1 04/24/2019 1402   MONOABS 0.4 04/24/2019 1402    EOSABS 0.2 04/24/2019 1402   BASOSABS 0.0 04/24/2019 1402    No results found for: POCLITH, LITHIUM   Lab Results  Component Value Date   CBMZ 8.0 03/18/2019     .res Assessment: Plan:   Pt is seen for 30 minutes and time spent counseling pt regarding concern about Equetro no longer being covered by her insurance. Discussed switching to generic Carbamazepine ER 400 mg po BID for mood s/s. Continue Latuda 60 mg po qd for mood s/s. Will schedule annual physical exam to obtain labs to monitor for adverse effects.  Pt to follow-up in 6 months or sooner if  clinically indicated.  Patient advised to contact office with any questions, adverse effects, or acute worsening in signs and symptoms.  Diana Decker was seen today for follow-up.  Diagnoses and all orders for this visit:  Bipolar disorder, in full remission, most recent episode depressed ([facility]) -     carbamazepine (EQUETRO) 200 MG CP12 12 hr capsule; Take 2 capsules (400 mg total) by mouth 2 (two) times daily. -     Lurasidone HCl (LATUDA) 120 MG TABS; TAKE 1/2-1 TAB EVERY EVENING WITH A MEAL     Please see After Visit Summary for patient specific instructions.  Future Appointments  Date Time Provider [place]  07/15/2020 11:00 AM [name], NP [facility]  12/19/2020  8:00 AM [place], Berdie Ogren, NP CP-CP None    No orders of the defined types were placed in this encounter.   -------------------------------

## 2020-06-24 DIAGNOSIS — F4320 Adjustment disorder, unspecified: Secondary | ICD-10-CM | POA: Diagnosis not present

## 2020-07-01 ENCOUNTER — Telehealth: Payer: Self-pay | Admitting: Adult Health

## 2020-07-01 ENCOUNTER — Other Ambulatory Visit: Payer: Self-pay | Admitting: Psychiatry

## 2020-07-01 DIAGNOSIS — F3176 Bipolar disorder, in full remission, most recent episode depressed: Secondary | ICD-10-CM

## 2020-07-01 MED ORDER — CARBAMAZEPINE ER 200 MG PO CP12: 400.0000 mg | ORAL_CAPSULE | Freq: Two times a day (BID) | ORAL | 5 refills | Status: DC

## 2020-07-01 NOTE — Telephone Encounter (Signed)
Diana Decker please send.And it shows she has a appt on 12/19/20 with regina was that a mistake?

## 2020-07-01 NOTE — Telephone Encounter (Signed)
This is Jessicas patient

## 2020-07-01 NOTE — Telephone Encounter (Signed)
Next visit is 12/19/20. Diana Decker said that she was taking Equietro brand name but it was causing headaches. She was put on generic Carbamazepine ER. She said the pharmacy doesn't have this RX there yet. Can this be sent to:  [id number] [place] [place] [address] Phone:  [phone]  Fax:  [phone]

## 2020-07-01 NOTE — Telephone Encounter (Signed)
Please send

## 2020-07-04 NOTE — Telephone Encounter (Signed)
Contacted pt's pharmacy and spoke with pharmacist. Clarified that plan is to switch from brand name Equetro to generic Carbamazepine ER. Pharmacist reports that he will make a note of this and fill script.

## 2020-07-05 DIAGNOSIS — M25562 Pain in left knee: Secondary | ICD-10-CM | POA: Diagnosis not present

## 2020-07-05 NOTE — Telephone Encounter (Signed)
They are not sure what you are requesting?

## 2020-07-06 MED ORDER — CARBAMAZEPINE ER 200 MG PO CP12: 400.0000 mg | ORAL_CAPSULE | Freq: Two times a day (BID) | ORAL | 5 refills | Status: DC

## 2020-07-06 NOTE — Telephone Encounter (Signed)
I spoke with the pharmacist the other day and he said he would make a note and they would fill it. I just sent it another way that says Carbatrol.

## 2020-07-08 DIAGNOSIS — F4320 Adjustment disorder, unspecified: Secondary | ICD-10-CM | POA: Diagnosis not present

## 2020-07-15 ENCOUNTER — Encounter: Payer: BC Managed Care – PPO | Admitting: Family

## 2020-07-22 DIAGNOSIS — F4320 Adjustment disorder, unspecified: Secondary | ICD-10-CM | POA: Diagnosis not present

## 2020-07-27 ENCOUNTER — Encounter: Payer: Self-pay | Admitting: Family

## 2020-07-30 DIAGNOSIS — M25562 Pain in left knee: Secondary | ICD-10-CM | POA: Diagnosis not present

## 2020-08-02 DIAGNOSIS — M25562 Pain in left knee: Secondary | ICD-10-CM | POA: Diagnosis not present

## 2020-08-02 DIAGNOSIS — M76892 Other specified enthesopathies of left lower limb, excluding foot: Secondary | ICD-10-CM | POA: Diagnosis not present

## 2020-08-05 DIAGNOSIS — F4320 Adjustment disorder, unspecified: Secondary | ICD-10-CM | POA: Diagnosis not present

## 2020-08-10 ENCOUNTER — Telehealth: Payer: Self-pay

## 2020-08-10 DIAGNOSIS — M25562 Pain in left knee: Secondary | ICD-10-CM | POA: Diagnosis not present

## 2020-08-10 DIAGNOSIS — S76312D Strain of muscle, fascia and tendon of the posterior muscle group at thigh level, left thigh, subsequent encounter: Secondary | ICD-10-CM | POA: Diagnosis not present

## 2020-08-10 DIAGNOSIS — M6281 Muscle weakness (generalized): Secondary | ICD-10-CM | POA: Diagnosis not present

## 2020-08-10 NOTE — Telephone Encounter (Signed)
Pt reports the generic carbamazepine causes blurred vision and headaches.   I initiated a Prior authorization for EQUETRO ER 200 MG 2 CAPSULES BID #120/30 day with [employer] ID# [id number]. Received an APPROVAL effective 08/08/2020-08/07/2021.

## 2020-08-18 DIAGNOSIS — S76312D Strain of muscle, fascia and tendon of the posterior muscle group at thigh level, left thigh, subsequent encounter: Secondary | ICD-10-CM | POA: Diagnosis not present

## 2020-08-18 DIAGNOSIS — M25562 Pain in left knee: Secondary | ICD-10-CM | POA: Diagnosis not present

## 2020-08-18 DIAGNOSIS — M6281 Muscle weakness (generalized): Secondary | ICD-10-CM | POA: Diagnosis not present

## 2020-08-19 DIAGNOSIS — F4320 Adjustment disorder, unspecified: Secondary | ICD-10-CM | POA: Diagnosis not present

## 2020-08-26 ENCOUNTER — Ambulatory Visit (INDEPENDENT_AMBULATORY_CARE_PROVIDER_SITE_OTHER): Payer: BC Managed Care – PPO | Admitting: Family

## 2020-08-26 ENCOUNTER — Other Ambulatory Visit: Payer: Self-pay

## 2020-08-26 ENCOUNTER — Telehealth: Payer: Self-pay | Admitting: Family

## 2020-08-26 ENCOUNTER — Encounter: Payer: Self-pay | Admitting: Family

## 2020-08-26 VITALS — BP 120/80 | HR 75 | Temp 98.7°F | Resp 20 | Ht 67.0 in | Wt 230.2 lb

## 2020-08-26 DIAGNOSIS — E785 Hyperlipidemia, unspecified: Secondary | ICD-10-CM

## 2020-08-26 DIAGNOSIS — Z0000 Encounter for general adult medical examination without abnormal findings: Secondary | ICD-10-CM

## 2020-08-26 DIAGNOSIS — B353 Tinea pedis: Secondary | ICD-10-CM | POA: Diagnosis not present

## 2020-08-26 DIAGNOSIS — M6281 Muscle weakness (generalized): Secondary | ICD-10-CM | POA: Diagnosis not present

## 2020-08-26 DIAGNOSIS — F319 Bipolar disorder, unspecified: Secondary | ICD-10-CM

## 2020-08-26 DIAGNOSIS — E039 Hypothyroidism, unspecified: Secondary | ICD-10-CM

## 2020-08-26 DIAGNOSIS — Z1159 Encounter for screening for other viral diseases: Secondary | ICD-10-CM | POA: Diagnosis not present

## 2020-08-26 DIAGNOSIS — A6000 Herpesviral infection of urogenital system, unspecified: Secondary | ICD-10-CM

## 2020-08-26 DIAGNOSIS — E663 Overweight: Secondary | ICD-10-CM

## 2020-08-26 DIAGNOSIS — B009 Herpesviral infection, unspecified: Secondary | ICD-10-CM

## 2020-08-26 DIAGNOSIS — S76312D Strain of muscle, fascia and tendon of the posterior muscle group at thigh level, left thigh, subsequent encounter: Secondary | ICD-10-CM | POA: Diagnosis not present

## 2020-08-26 DIAGNOSIS — M25562 Pain in left knee: Secondary | ICD-10-CM | POA: Diagnosis not present

## 2020-08-26 HISTORY — DX: Encounter for general adult medical examination without abnormal findings: Z00.00

## 2020-08-26 MED ORDER — FLUCONAZOLE 150 MG PO TABS: ORAL_TABLET | ORAL | 0 refills | Status: DC

## 2020-08-26 MED ORDER — LEVOTHYROXINE SODIUM 50 MCG PO TABS: ORAL_TABLET | ORAL | 1 refills | Status: DC

## 2020-08-26 MED ORDER — VALACYCLOVIR HCL 1 G PO TABS: 500.0000 mg | ORAL_TABLET | Freq: Every day | ORAL | 1 refills | Status: DC

## 2020-08-26 MED ORDER — ATORVASTATIN CALCIUM 20 MG PO TABS: 20.0000 mg | ORAL_TABLET | Freq: Every day | ORAL | 1 refills | Status: DC

## 2020-08-26 NOTE — Assessment & Plan Note (Signed)
Discussed healthy diet, exercise, weight loss. Obtain labs as ordered. She will send me a copy of her booster information. Tetanus up to date. Will request copy of pap/mammo. Colo is up to date.

## 2020-08-26 NOTE — Assessment & Plan Note (Signed)
Refractory to topical therapy.  Will rx with diflucan as below.

## 2020-08-26 NOTE — Assessment & Plan Note (Signed)
Stable on daily suppressive therapy with valtrex 500mg  once daily.

## 2020-08-26 NOTE — Progress Notes (Signed)
Subjective:   By signing my name below, I, Diana Decker, attest that this documentation has been prepared under the direction and in the presence of  [name] NP. 08/26/2020   Patient ID: Diana Decker, female    DOB: 1965-03-28, 55 y.o.   MRN: [id number]  Chief Complaint  Patient presents with   Annual Exam    Pt states not fasting     HPI Patient is in today for a comprehensive physical exam. She denies having any unexpected weight change, hearing loss and rhinorrhea, visual disturbance, cough, chest pain and leg swelling, nausea, vomiting, constipation, diarrhea and blood in stool, or dysuria and frequency, for myalgias and arthralgias, rash, headaches, adenopathy, depression or anxiety at this time. She has had a eye procedure recently, otherwise she has no other recent surgical history changes. Her half-sister has a history of diabetes, crohn's disease, alcohol abuse, and obesity, her half-brother has a history of degenerative disk disease, her maternal grandfather died from lung cancer, otherwise she has no other changes to her family history. She does not drink alcohol, does not smoke tobacco at this time, does not use drugs. She continues working as a Education officer, museum. She has a past history of oral opioids and alcohol.  Valtrex- She continues using 500 mg valtrex daily PO and is requesting a refill.  Thyroid- She is requesting a refill on 50 mcg synthroid daily PO. Athletes foot- She reports that her athletes foot is not improving since using powder and sprays. She is requesting another treatment method. She is willing to try oral treatments.  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 Hamstring tendonitis- She was recently diagnosed with hamstring tendonitis since injuring her knee. She has constant sore pain in her knee. She is going to physical therapy to manage her symptoms. Weight loss- She is interested  in taking weight loss medication to help manage her weight. She has met with a nutritionist to manage her weight but did not find it effective. She is interested in setting up an appointment with a healthy weight and wellness program.   Immunizations: She is willing to get a screening of HIV and hepatitis. She has 3 Covid-19 vaccines at this time. She is UTD on shingles vaccines. She is UTD on tetanus vaccines.  Diet: She does not consistently manage a healthy diet. She uses weight watchers program. She notes that she has stress eating habits and cites stress from her work as a cause for breaking her healthy habits.  Exercise: She does not participate in regular exercise since injuring her knee. She has started doing physical therapy to help manage her symptoms.  Colonoscopy: Last completed on 05/25/2019.Results showed one 3 mm polyp in the cecum which was removed with a cold snare, the anal papillae were hypertrophied, otherwise results are normal. Pap Smear : Last completed in 07/01/2017. Results were normal. Repeat in 3 years. (Due) Mammogram: Last completed on 07/01/2017. Results are normal. Repeat in one year. (Due) Dexa: Last completed on 05/01/2019. Results are normal. Dental- She is UTD on dental care.  Vision- She is UTD on vision care.   Health Maintenance Due  Topic Date Due   Hepatitis C Screening  Never done   MAMMOGRAM  07/02/2018   COVID-19 Vaccine (3 - Pfizer risk series) 04/17/2019   PAP SMEAR-Modifier  07/01/2020    Past Medical History:  Diagnosis Date   Alcoholism ([place])    quit 09/23/1999  Allergy    Anemia    Anxiety    patient denies this dx   Bipolar 1 disorder ([place])    Borderline personality disorder ([place])    Depression    GERD (gastroesophageal reflux disease)    no current problems   Hyperlipidemia 08/19/2017   Hypothyroidism 06/08/2010   IBS (irritable bowel syndrome)    Preventative health care 08/26/2020   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,  cancerous lesion removed    Past Surgical History:  Procedure Laterality Date   APPENDECTOMY     COLONOSCOPY  05/03/2009   Normal - [facility]   EYE SURGERY Left    moh's procedure left eyelid   FOOT SURGERY Right    HERNIA REPAIR     inguinal   SQUAMOUS CELL CARCINOMA EXCISION Left    Mohs procedure   UPPER GASTROINTESTINAL ENDOSCOPY  05/03/2009   [facility]   WISDOM TOOTH EXTRACTION      Family History  Problem Relation Age of Onset   Breast cancer Mother    Depression Mother    Prostate cancer Father    Heart disease Father    Alcohol abuse Father    Depression Father    Anxiety disorder Father    Cancer Maternal Grandfather        lung?   Depression Maternal Uncle    Alcohol abuse Maternal Uncle    Suicidality Maternal Uncle    Alcohol abuse Half-Sister    Bipolar disorder Half-Sister    Obesity Half-Sister    Diabetes type II Half-Sister    Colon cancer Neg Hx    Rectal cancer Neg Hx    Stomach cancer Neg Hx     Social History   Socioeconomic History   Marital status: Married    Spouse name: Not on file   Number of children: Not on file   Years of education: Not on file   Highest education level: Not on file  Occupational History   Occupation: Hospital doctor  Tobacco Use   Smoking status: Never   Smokeless tobacco: Never  Vaping Use   Vaping Use: Never used  Substance and Sexual Activity   Alcohol use: No   Drug use: No   Sexual activity: Yes    Birth control/protection: None    Comment: husband with vasectomy  Other Topics Concern   Not on file  Social History Narrative   Regular exercise-yes   Works as a Education officer, 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    Social Determinants of Health   Financial Resource Strain: Not on file  Food Insecurity: Not on file  Transportation Needs: Not on file  Physical Activity: Not on file  Stress: Not on file  Social Connections: Not on file  Intimate Partner Violence: Not on  file    Outpatient Medications Prior to Visit  Medication Sig Dispense Refill   Lurasidone HCl (LATUDA) 120 MG TABS TAKE 1/2-1 TAB EVERY EVENING WITH A MEAL 90 tablet 0   Multiple Vitamin (MULTIVITAMIN) tablet Take 1 tablet by mouth daily.     Omega-3 1000 MG CAPS Take by mouth.     atorvastatin (LIPITOR) 20 MG tablet TAKE 1 TABLET BY MOUTH EVERY DAY 90 tablet 0   levothyroxine (SYNTHROID) 50 MCG tablet TAKE 1 TABLET BY MOUTH EVERY DAY BEFORE BREAKFAST 90 tablet 1   valACYclovir (VALTREX) 1000 MG tablet TAKE 1/2 TABLET BY MOUTH EVERY DAY 45 tablet 1   carbamazepine (EQUETRO) 200  MG CP12 12 hr capsule Take 2 capsules (400 mg total) by mouth 2 (two) times daily. 120 capsule 5   carbamazepine (CARBATROL) 200 MG 12 hr capsule Take 2 capsules (400 mg total) by mouth 2 (two) times daily. 120 capsule 5   No facility-administered medications prior to visit.    No Known Allergies  Review of Systems  Constitutional:        (-)unexpected weight changes (-)adenopathy  HENT:  Negative for hearing loss.        (-)rhinorrhea   Eyes:        (-)visual disturbance  Respiratory:  Negative for cough.   Cardiovascular:  Negative for chest pain and leg swelling.  Gastrointestinal:  Negative for blood in stool, constipation, diarrhea, nausea and vomiting.  Genitourinary:  Negative for dysuria and frequency.  Musculoskeletal:  Negative for joint pain and myalgias.  Skin:  Negative for rash.  Neurological:  Negative for headaches.  Psychiatric/Behavioral:  Negative for depression. The patient is not nervous/anxious.       Objective:    Physical Exam Constitutional:      General: She is not in acute distress.    Appearance: Normal appearance. She is not ill-appearing.  HENT:     Head: Normocephalic and atraumatic.     Right Ear: Tympanic membrane, ear canal and external ear normal.     Left Ear: Tympanic membrane, ear canal and external ear normal.  Eyes:     Extraocular Movements: Extraocular  movements intact.     Pupils: Pupils are equal, round, and reactive to light.     Comments: (-)nystagmus   Cardiovascular:     Rate and Rhythm: Normal rate and regular rhythm.     Pulses: Normal pulses.     Heart sounds: Normal heart sounds. No murmur heard.   No gallop.  Pulmonary:     Effort: Pulmonary effort is normal. No respiratory distress.     Breath sounds: Normal breath sounds. No wheezing, rhonchi or rales.  Abdominal:     General: Bowel sounds are normal. There is no distension.     Palpations: Abdomen is soft.     Tenderness: There is no abdominal tenderness. There is no guarding or rebound.  Musculoskeletal:     Comments: 5/5 strength in upper and lower extremities  Lymphadenopathy:     Cervical: No cervical adenopathy.  Skin:    General: Skin is warm and dry.     Comments: Dry cracked skin between toes  Neurological:     Mental Status: She is alert and oriented to person, place, and time.     Deep Tendon Reflexes:     Reflex Scores:      Patellar reflexes are 2+ on the right side and 2+ on the left side. Psychiatric:        Behavior: Behavior normal.        Judgment: Judgment normal.    BP 120/80 (BP Location: Left Arm, Patient Position: Sitting, Cuff Size: Normal)   Pulse 75   Temp 98.7 F (37.1 C) (Oral)   Resp 20   Ht '5\' 7"'  (1.702 m)   Wt 230 lb 3.2 oz (104.4 kg)   LMP 11/29/2015   SpO2 98%   BMI 36.05 kg/m  Wt Readings from Last 3 Encounters:  08/26/20 230 lb 3.2 oz (104.4 kg)  10/30/19 228 lb (103.4 kg)  05/25/19 231 lb (104.8 kg)       Assessment & Plan:   Problem List Items Addressed This  Visit       Unprioritized   Hypothyroidism (Chronic)   Relevant Medications   levothyroxine (SYNTHROID) 50 MCG tablet   Other Relevant Orders   TSH   Bipolar 1 disorder (HCC) (Chronic)    Stable, management per psychiatry.       Tinea pedis of left foot    Refractory to topical therapy.  Will rx with diflucan as below.        Relevant  Medications   valACYclovir (VALTREX) 1000 MG tablet   fluconazole (DIFLUCAN) 150 MG tablet   Preventative health care - Primary    Discussed healthy diet, exercise, weight loss. Obtain labs as ordered. She will send me a copy of her booster information. Tetanus up to date. Will request copy of pap/mammo. Colo is up to date.        Hyperlipidemia   Relevant Medications   atorvastatin (LIPITOR) 20 MG tablet   Other Relevant Orders   Lipid panel   Comp Met (CMET)   Herpes simplex   Relevant Medications   valACYclovir (VALTREX) 1000 MG tablet   fluconazole (DIFLUCAN) 150 MG tablet   Genital herpes    Stable on daily suppressive therapy with valtrex 526m once daily.        Relevant Medications   valACYclovir (VALTREX) 1000 MG tablet   fluconazole (DIFLUCAN) 150 MG tablet   Other Visit Diagnoses     Overweight       Relevant Orders   Amb Ref to Medical Weight Management   Need for hepatitis C screening test       Relevant Orders   Hepatitis C Antibody         Meds ordered this encounter  Medications   atorvastatin (LIPITOR) 20 MG tablet    Sig: Take 1 tablet (20 mg total) by mouth daily.    Dispense:  90 tablet    Refill:  1    Order Specific Question:   Supervising Provider    Answer:   BPenni HomansA [[id number]]   levothyroxine (SYNTHROID) 50 MCG tablet    Sig: TAKE 1 TABLET BY MOUTH EVERY DAY BEFORE BREAKFAST    Dispense:  90 tablet    Refill:  1    Order Specific Question:   Supervising Provider    Answer:   BPenni HomansA [[id number]]   valACYclovir (VALTREX) 1000 MG tablet    Sig: Take 0.5 tablets (500 mg total) by mouth daily.    Dispense:  45 tablet    Refill:  1    Order Specific Question:   Supervising Provider    Answer:   BPenni HomansA [[id number]]   fluconazole (DIFLUCAN) 150 MG tablet    Sig: 1 tab by mouth today, and then once weekly for 2-3 weeks    Dispense:  3 tablet    Refill:  0    Order Specific Question:   Supervising Provider    Answer:   BPenni HomansA [[id number]]    I, MDebbrah AlarNP., personally performed the services described in this documentation.  All medical record entries made by the scribe were at my direction and in my presence.  I have reviewed the chart and discharge instructions (if applicable) and agree that the record reflects my personal performance and is accurate and complete. 08/26/2020   I,Diana Decker,acting as a sEducation administratorfor MNance Pear NP.,have documented all relevant documentation on the behalf of MNance Pear NP,as directed by  MNance Pear NP while  in the presence of Nance Pear, NP.    Nance Pear, NP

## 2020-08-26 NOTE — Telephone Encounter (Signed)
Please call [facility] office and request a copy of mammo and pap.

## 2020-08-26 NOTE — Patient Instructions (Signed)
Please begin diflucan for your foot. Complete lab work prior to leaving.

## 2020-08-26 NOTE — Assessment & Plan Note (Signed)
Stable, management per psychiatry.

## 2020-08-28 ENCOUNTER — Encounter: Payer: Self-pay | Admitting: Family

## 2020-08-29 LAB — COMPREHENSIVE METABOLIC PANEL
AG Ratio: 1.5 (calc) (ref 1.0–2.5)
ALT: 21 U/L (ref 6–29)
AST: 15 U/L (ref 10–35)
Albumin: 4.2 g/dL (ref 3.6–5.1)
Alkaline phosphatase (APISO): 112 U/L (ref 37–153)
BUN: 15 mg/dL (ref 7–25)
CO2: 28 mmol/L (ref 20–32)
Calcium: 9.3 mg/dL (ref 8.6–10.4)
Chloride: 100 mmol/L (ref 98–110)
Creat: 0.78 mg/dL (ref 0.50–1.03)
Globulin: 2.8 g/dL (calc) (ref 1.9–3.7)
Glucose, Bld: 128 mg/dL — ABNORMAL HIGH (ref 65–99)
Potassium: 3.8 mmol/L (ref 3.5–5.3)
Sodium: 138 mmol/L (ref 135–146)
Total Bilirubin: 0.2 mg/dL (ref 0.2–1.2)
Total Protein: 7 g/dL (ref 6.1–8.1)

## 2020-08-29 LAB — HEPATITIS C ANTIBODY
Hepatitis C Ab: NONREACTIVE
SIGNAL TO CUT-OFF: 0.01 (ref <1.00)

## 2020-08-29 LAB — LIPID PANEL
Cholesterol: 203 mg/dL — ABNORMAL HIGH (ref <200)
HDL: 72 mg/dL (ref >=50)
LDL Cholesterol (Calc): 101 mg/dL (calc) — ABNORMAL HIGH
Non-HDL Cholesterol (Calc): 131 mg/dL (calc) — ABNORMAL HIGH (ref <130)
Total CHOL/HDL Ratio: 2.8 (calc) (ref <5.0)
Triglycerides: 187 mg/dL — ABNORMAL HIGH (ref <150)

## 2020-08-29 LAB — TSH: TSH: 3.07 mIU/L

## 2020-08-29 NOTE — Telephone Encounter (Signed)
Medical release form faxed to [facility]   Fax number: [phone]

## 2020-09-01 ENCOUNTER — Encounter: Payer: Self-pay | Admitting: Family

## 2020-09-02 DIAGNOSIS — F4320 Adjustment disorder, unspecified: Secondary | ICD-10-CM | POA: Diagnosis not present

## 2020-09-02 DIAGNOSIS — M25562 Pain in left knee: Secondary | ICD-10-CM | POA: Diagnosis not present

## 2020-09-02 DIAGNOSIS — M6281 Muscle weakness (generalized): Secondary | ICD-10-CM | POA: Diagnosis not present

## 2020-09-02 DIAGNOSIS — S76312D Strain of muscle, fascia and tendon of the posterior muscle group at thigh level, left thigh, subsequent encounter: Secondary | ICD-10-CM | POA: Diagnosis not present

## 2020-09-16 DIAGNOSIS — F4320 Adjustment disorder, unspecified: Secondary | ICD-10-CM | POA: Diagnosis not present

## 2020-10-11 DIAGNOSIS — R635 Abnormal weight gain: Secondary | ICD-10-CM | POA: Diagnosis not present

## 2020-10-19 DIAGNOSIS — E782 Mixed hyperlipidemia: Secondary | ICD-10-CM | POA: Diagnosis not present

## 2020-10-19 DIAGNOSIS — E669 Obesity, unspecified: Secondary | ICD-10-CM | POA: Diagnosis not present

## 2020-10-19 DIAGNOSIS — F319 Bipolar disorder, unspecified: Secondary | ICD-10-CM | POA: Diagnosis not present

## 2020-10-19 DIAGNOSIS — E039 Hypothyroidism, unspecified: Secondary | ICD-10-CM | POA: Diagnosis not present

## 2020-11-01 DIAGNOSIS — E669 Obesity, unspecified: Secondary | ICD-10-CM | POA: Diagnosis not present

## 2020-11-01 DIAGNOSIS — Z6837 Body mass index (BMI) 37.0-37.9, adult: Secondary | ICD-10-CM | POA: Diagnosis not present

## 2020-11-01 DIAGNOSIS — F319 Bipolar disorder, unspecified: Secondary | ICD-10-CM | POA: Diagnosis not present

## 2020-11-01 DIAGNOSIS — E039 Hypothyroidism, unspecified: Secondary | ICD-10-CM | POA: Diagnosis not present

## 2020-11-22 DIAGNOSIS — E039 Hypothyroidism, unspecified: Secondary | ICD-10-CM | POA: Diagnosis not present

## 2020-11-22 DIAGNOSIS — E669 Obesity, unspecified: Secondary | ICD-10-CM | POA: Diagnosis not present

## 2020-11-22 DIAGNOSIS — F319 Bipolar disorder, unspecified: Secondary | ICD-10-CM | POA: Diagnosis not present

## 2020-11-29 DIAGNOSIS — E669 Obesity, unspecified: Secondary | ICD-10-CM | POA: Diagnosis not present

## 2020-11-29 DIAGNOSIS — Z713 Dietary counseling and surveillance: Secondary | ICD-10-CM | POA: Diagnosis not present

## 2020-12-19 ENCOUNTER — Ambulatory Visit: Payer: BC Managed Care – PPO | Admitting: Adult Health

## 2020-12-19 ENCOUNTER — Ambulatory Visit: Payer: BC Managed Care – PPO | Admitting: Psychiatry

## 2020-12-27 DIAGNOSIS — Z6834 Body mass index (BMI) 34.0-34.9, adult: Secondary | ICD-10-CM | POA: Diagnosis not present

## 2020-12-27 DIAGNOSIS — E669 Obesity, unspecified: Secondary | ICD-10-CM | POA: Diagnosis not present

## 2020-12-27 DIAGNOSIS — F319 Bipolar disorder, unspecified: Secondary | ICD-10-CM | POA: Diagnosis not present

## 2020-12-27 DIAGNOSIS — E039 Hypothyroidism, unspecified: Secondary | ICD-10-CM | POA: Diagnosis not present

## 2020-12-30 DIAGNOSIS — Z01419 Encounter for gynecological examination (general) (routine) without abnormal findings: Secondary | ICD-10-CM | POA: Diagnosis not present

## 2020-12-30 DIAGNOSIS — Z6833 Body mass index (BMI) 33.0-33.9, adult: Secondary | ICD-10-CM | POA: Diagnosis not present

## 2020-12-30 DIAGNOSIS — Z1231 Encounter for screening mammogram for malignant neoplasm of breast: Secondary | ICD-10-CM | POA: Diagnosis not present

## 2021-01-17 ENCOUNTER — Ambulatory Visit: Payer: BC Managed Care – PPO | Admitting: Psychiatry

## 2021-01-19 ENCOUNTER — Other Ambulatory Visit: Payer: Self-pay

## 2021-01-19 ENCOUNTER — Ambulatory Visit (INDEPENDENT_AMBULATORY_CARE_PROVIDER_SITE_OTHER): Payer: BC Managed Care – PPO | Admitting: Psychiatry

## 2021-01-19 ENCOUNTER — Encounter: Payer: Self-pay | Admitting: Psychiatry

## 2021-01-19 DIAGNOSIS — F3176 Bipolar disorder, in full remission, most recent episode depressed: Secondary | ICD-10-CM | POA: Diagnosis not present

## 2021-01-19 MED ORDER — LATUDA 120 MG PO TABS: ORAL_TABLET | ORAL | 0 refills | Status: DC

## 2021-01-19 MED ORDER — CARBAMAZEPINE ER 200 MG PO CP12: 400.0000 mg | ORAL_CAPSULE | Freq: Two times a day (BID) | ORAL | 1 refills | Status: DC

## 2021-01-19 NOTE — Progress Notes (Signed)
Diana Decker [id number] 08-29-1965 55 y.o.  Subjective:   Patient ID:  Diana Decker is a 55 y.o. (DOB 1965/05/11) female.  Chief Complaint:  Chief Complaint  Patient presents with   Follow-up    Bipolar Disorder and anxiety    HPI Diana Decker presents to the office today for follow-up of Bipolar D/O and anxiety. Has lost 30 lbs intentionally through Clinical wt loss Management at [facility] since August 30th. Location is 5 minutes from her work. Has been enjoying yoga, going to the gym, and is also working with a fitness specialist. She reports that her motivation for wt loss has been good.   Mood has been ok and thinks that healthier eating has helped her mood. She reports that she will periodically have slightly lower mood. Had some manic s/s about a month ago and thinks it may be related to taking Metamucil too close to the time of her night-time meds. She is now taking Metamucil mid-day and mood has been more stable. Continues to "have some" anxiety and it is typically work related, ie. What she said or didn't say. She reports that anxiety has not been debilitating. Sleep is fair with some frequent awakenings due to various reasons (temperature, needing to urinate). Concentration is ok. Energy has been good. Reports "minimal" SI and that SI has significantly improved compared to the past.   No longer seeing therapist.   Continues to help take care of her mother. Stress with sister.   Received promotion at work and reports that work is going well.   Past Psychiatric Medication Trials: Latuda- Had restless legs on higher doses.  Good response to 60 mg dose Equetro-effective for mood stabilization without visual changes Carbamazepine (generic)-had vision changes and drowsiness Abilify-effective.  Experience compulsive eating and spending.  Weight gain Saphris Depakote-some weight gain Lithium-took briefly Serzone Trazodone Lamictal-caused  insomnia Melatonin   AIMS    Flowsheet Row Office Visit from 01/19/2021 in [place] Office Visit from 09/12/2018 in [place] Total Score 0 0      PHQ2-9    [place] Office Visit from 07/01/2017 in [facility] [place] Visit from 05/29/2016 in [facility] [place]  PHQ-2 Total Score 0 4  PHQ-9 Total Score 2 13        Review of Systems:  Review of Systems  Musculoskeletal:  Negative for gait problem.  Neurological:  Negative for tremors.  Psychiatric/Behavioral:         Please refer to HPI   Medications: I have reviewed the patient's current medications.  Current Outpatient Medications  Medication Sig Dispense Refill   atorvastatin (LIPITOR) 20 MG tablet Take 1 tablet (20 mg total) by mouth daily. 90 tablet 1   levothyroxine (SYNTHROID) 50 MCG tablet TAKE 1 TABLET BY MOUTH EVERY DAY BEFORE BREAKFAST 90 tablet 1   Multiple Vitamin (MULTIVITAMIN) tablet Take 1 tablet by mouth daily.     psyllium (METAMUCIL) 58.6 % packet Take 1 packet by mouth daily.     valACYclovir (VALTREX) 1000 MG tablet Take 0.5 tablets (500 mg total) by mouth daily. 45 tablet 1   carbamazepine (EQUETRO) 200 MG CP12 12 hr capsule Take 2 capsules (400 mg total) by mouth 2 (two) times daily. 360 capsule 1   Lurasidone HCl (LATUDA) 120 MG TABS TAKE 1/2-1 TAB EVERY EVENING WITH A MEAL 90 tablet 0   No current facility-administered  medications for this visit.    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   Please see review of systems for further details on the patient's review from today.   Objective:   Physical Exam:  LMP 11/29/2015   Physical Exam Constitutional:      General: She is not in acute distress. Musculoskeletal:        General: No deformity.  Neurological:     Mental Status: She is alert and oriented to person, place, and time.     Coordination: Coordination normal.  Psychiatric:        Attention and Perception: Attention and perception normal. She does not perceive auditory or visual hallucinations.        Mood and Affect: Mood normal. Mood is not anxious or depressed. Affect is not labile, blunt, angry or inappropriate.        Speech: Speech normal.        Behavior: Behavior normal.        Thought Content: Thought content normal. Thought content is not paranoid or delusional. Thought content does not include homicidal or suicidal ideation. Thought content does not include homicidal or suicidal plan.        Cognition and Memory: Cognition and memory normal.        Judgment: Judgment normal.     Comments: Insight intact    Lab Review:     Component Value Date/Time   NA 138 08/26/2020 1440   K 3.8 08/26/2020 1440   CL 100 08/26/2020 1440   CO2 28 08/26/2020 1440   GLUCOSE 128 (H) 08/26/2020 1440   BUN 15 08/26/2020 1440   CREATININE 0.78 08/26/2020 1440   CALCIUM 9.3 08/26/2020 1440   PROT 7.0 08/26/2020 1440   ALBUMIN 4.2 04/24/2019 1402   AST 15 08/26/2020 1440   ALT 21 08/26/2020 1440   ALKPHOS 89 04/24/2019 1402   BILITOT 0.2 08/26/2020 1440   GFRNONAA >90 07/11/2013 1511   GFRAA >90 07/11/2013 1511       Component Value Date/Time   WBC 5.0 04/24/2019 1402   RBC 3.86 (L) 04/24/2019 1402   HGB 12.6  04/24/2019 1402   HCT 37.6 04/24/2019 1402   PLT 281.0 04/24/2019 1402   MCV 97.3 04/24/2019 1402   MCH 32.7 03/18/2019 1450   MCHC 33.5 04/24/2019 1402   RDW 12.7 04/24/2019 1402   LYMPHSABS 2.1 04/24/2019 1402   MONOABS 0.4 04/24/2019 1402   EOSABS 0.2 04/24/2019 1402   BASOSABS 0.0 04/24/2019 1402    No results found for: POCLITH, LITHIUM   Lab Results  Component Value Date   CBMZ 8.0 03/18/2019     .res Assessment: Plan:   Pt seen for 30 minutes and time spent reviewing changes since last visit to include receiving services through  weight loss clinic, no longer seeing therapist, and family stressors. Discussed that it may be beneficial to obtain Carbamazepine level. She reports that she would prefer this provider to contact her PCP to request addition of Carbamazepine level to other labs. Message sent of PCP with request for January.  Continue Latuda 60 mg po qd with evening meal.  Continue Equetro 400 mg po BID for mood s/s.  Pt to follow-up in 6 months or sooner if clinically indicated.  Patient advised to contact office with any questions, adverse effects, or acute worsening in signs and symptoms.   Samar was seen today for follow-up.  Diagnoses and all orders for this visit:  Bipolar disorder, in full remission, most recent episode depressed (HCC) -     carbamazepine (EQUETRO) 200 MG CP12 12 hr capsule; Take 2 capsules (400 mg total) by mouth 2 (two) times daily. -     Lurasidone HCl (LATUDA) 120 MG TABS; TAKE 1/2-1 TAB EVERY EVENING WITH A MEAL    Please see After Visit Summary for patient specific instructions.  Future Appointments  Date Time Provider [place]  02/24/2021  1:20 PM [name], NP [facility]  07/20/2021  8:30 AM Thayer Headings, PMHNP [facility] None    No orders of the defined types were placed in this encounter.   -------------------------------

## 2021-02-11 ENCOUNTER — Encounter (INDEPENDENT_AMBULATORY_CARE_PROVIDER_SITE_OTHER): Payer: Self-pay

## 2021-02-24 ENCOUNTER — Ambulatory Visit: Payer: BC Managed Care – PPO | Admitting: Family

## 2021-03-03 ENCOUNTER — Ambulatory Visit (INDEPENDENT_AMBULATORY_CARE_PROVIDER_SITE_OTHER): Payer: BC Managed Care – PPO | Admitting: Family

## 2021-03-03 VITALS — BP 110/76 | HR 68 | Temp 98.1°F | Resp 16 | Ht 67.0 in | Wt 204.4 lb

## 2021-03-03 DIAGNOSIS — E669 Obesity, unspecified: Secondary | ICD-10-CM | POA: Diagnosis not present

## 2021-03-03 DIAGNOSIS — B009 Herpesviral infection, unspecified: Secondary | ICD-10-CM | POA: Diagnosis not present

## 2021-03-03 DIAGNOSIS — E039 Hypothyroidism, unspecified: Secondary | ICD-10-CM

## 2021-03-03 DIAGNOSIS — B353 Tinea pedis: Secondary | ICD-10-CM

## 2021-03-03 DIAGNOSIS — E785 Hyperlipidemia, unspecified: Secondary | ICD-10-CM

## 2021-03-03 LAB — LIPID PANEL
Cholesterol: 168 mg/dL (ref 0–200)
HDL: 64.1 mg/dL (ref >39.00)
LDL Cholesterol: 87 mg/dL (ref 0–99)
NonHDL: 103.66
Total CHOL/HDL Ratio: 3
Triglycerides: 83 mg/dL (ref 0.0–149.0)
VLDL: 16.6 mg/dL (ref 0.0–40.0)

## 2021-03-03 LAB — TSH: TSH: 2.44 u[IU]/mL (ref 0.35–5.50)

## 2021-03-03 MED ORDER — FLUCONAZOLE 150 MG PO TABS: ORAL_TABLET | ORAL | 0 refills | Status: DC

## 2021-03-03 MED ORDER — ATORVASTATIN CALCIUM 20 MG PO TABS: 20.0000 mg | ORAL_TABLET | Freq: Every day | ORAL | 1 refills | Status: DC

## 2021-03-03 MED ORDER — LEVOTHYROXINE SODIUM 50 MCG PO TABS: ORAL_TABLET | ORAL | 1 refills | Status: DC

## 2021-03-03 NOTE — Patient Instructions (Signed)
Please complete lab work prior to leaving.  Great job on your [employer] loss!

## 2021-03-03 NOTE — Assessment & Plan Note (Signed)
Repeat diflucan. She only took one tablet last time the other two got lost.

## 2021-03-03 NOTE — Assessment & Plan Note (Signed)
She has been working hard with the Weight loss clinic and I commended her on her weight loss.

## 2021-03-03 NOTE — Assessment & Plan Note (Signed)
Stable on Valtrex, management per GYN.

## 2021-03-03 NOTE — Assessment & Plan Note (Signed)
Clinically stable on synthroid. Continue synthroid, check follow up TSH.

## 2021-03-03 NOTE — Assessment & Plan Note (Signed)
Tolerating statin, obtain follow-up lipid panel. 

## 2021-03-03 NOTE — Progress Notes (Signed)
Subjective:     Patient ID: Diana Decker, female    DOB: 12/07/1965, 56 y.o.   MRN: [id number]  Chief Complaint  Patient presents with   Hypothyroidism    Here for follow up   Hyperlipidemia   Knee Pain    Here for follow up    Hyperlipidemia  Knee Pain   Patient is in today for follow up.  Hypothyroid- had [place] of her thyroid performed 9/21 which noted the following:  Marked thyroid heterogeneity and enlargement. Diffuse hypervascularity. Findings compatible with medical thyroid disease including thyroiditis.   No significant nodule that warrants follow-up or biopsy.  Still having some peeling of her left foot and itching.   Genital HSV- using valtex which is now being rx'd by her GYN. Reports well controlled.   Lab Results  Component Value Date   TSH 3.07 08/26/2020   Hyperlipidemia-  Lab Results  Component Value Date   CHOL 203 (H) 08/26/2020   HDL 72 08/26/2020   LDLCALC 101 (H) 08/26/2020   LDLDIRECT 191.0 04/24/2019   TRIG 187 (H) 08/26/2020   CHOLHDL 2.8 08/26/2020   She has been working on weight loss.  She is working with [facility]. She is doing Optifast.  Doing yoga 2 x a week, Gym 3 days a week. Her max weight was 234.2.    Wt Readings from Last 3 Encounters:  03/03/21 204 lb 6.4 oz (92.7 kg)  08/26/20 230 lb 3.2 oz (104.4 kg)  10/30/19 228 lb (103.4 kg)      Health Maintenance Due  Topic Date Due   MAMMOGRAM  10/22/2020   COVID-19 Vaccine (5 - Booster for [employer] series) 01/27/2021    Past Medical History:  Diagnosis Date   Alcoholism ([place])    quit 09/23/1999   Allergy    Anemia    Anxiety    patient denies this dx   Bipolar 1 disorder ([place])    Borderline personality disorder ([place])    Depression    GERD (gastroesophageal reflux disease)    no current problems   Hyperlipidemia 08/19/2017   Hypothyroidism 06/08/2010   IBS (irritable bowel syndrome)    Preventative health care 08/26/2020   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, cancerous lesion removed    Past Surgical History:  Procedure Laterality Date   APPENDECTOMY     COLONOSCOPY  05/03/2009   Normal - [place]   EYE SURGERY Left    moh's procedure left eyelid   FOOT SURGERY Right    HERNIA REPAIR     inguinal   SQUAMOUS CELL CARCINOMA EXCISION Left    Mohs procedure   UPPER GASTROINTESTINAL ENDOSCOPY  05/03/2009   [facility]   WISDOM TOOTH EXTRACTION      Family History  Problem Relation Age of Onset   Breast cancer Mother    Depression Mother    Prostate cancer Father    Heart disease Father    Alcohol abuse Father    Depression Father    Anxiety disorder Father    Cancer Maternal Grandfather        lung?   Depression Maternal Uncle    Alcohol abuse Maternal Uncle    Suicidality Maternal Uncle    Alcohol abuse Half-Sister    Bipolar disorder Half-Sister    Obesity Half-Sister    Diabetes type II Half-Sister    Colon cancer Neg Hx    Rectal cancer Neg Hx    Stomach cancer  Neg Hx     Social History   Socioeconomic History   Marital status: Married    Spouse name: Not on file   Number of children: Not on file   Years of education: Not on file   Highest education level: Not on file  Occupational History   Occupation: Hospital doctor  Tobacco Use   Smoking status: Never   Smokeless tobacco: Never  Vaping Use   Vaping Use: Never used  Substance and Sexual Activity   Alcohol use: No   Drug use: No   Sexual activity: Yes    Birth control/protection: None    Comment: husband with vasectomy  Other Topics Concern   Not on file  Social History Narrative   Regular exercise-yes   Works as a Education officer, 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    Social Determinants of Health   Financial Resource Strain: Not on file  Food Insecurity: Not on file  Transportation Needs: Not on file  Physical Activity: Not on file  Stress: Not on file  Social Connections: Not on file  Intimate Partner  Violence: Not on file    Outpatient Medications Prior to Visit  Medication Sig Dispense Refill   carbamazepine (EQUETRO) 200 MG CP12 12 hr capsule Take 2 capsules (400 mg total) by mouth 2 (two) times daily. 360 capsule 1   Lurasidone HCl (LATUDA) 120 MG TABS TAKE 1/2-1 TAB EVERY EVENING WITH A MEAL 90 tablet 0   Multiple Vitamin (MULTIVITAMIN) tablet Take 1 tablet by mouth daily.     psyllium (METAMUCIL) 58.6 % packet Take 1 packet by mouth daily.     valACYclovir (VALTREX) 1000 MG tablet Take 0.5 tablets (500 mg total) by mouth daily. 45 tablet 1   atorvastatin (LIPITOR) 20 MG tablet Take 1 tablet (20 mg total) by mouth daily. 90 tablet 1   levothyroxine (SYNTHROID) 50 MCG tablet TAKE 1 TABLET BY MOUTH EVERY DAY BEFORE BREAKFAST 90 tablet 1   No facility-administered medications prior to visit.    No Known Allergies  ROS     Objective:    Physical Exam Constitutional:      General: She is not in acute distress.    Appearance: Normal appearance. She is well-developed.  HENT:     Head: Normocephalic and atraumatic.     Right Ear: External ear normal.     Left Ear: External ear normal.  Eyes:     General: No scleral icterus. Neck:     Thyroid: No thyromegaly.  Cardiovascular:     Rate and Rhythm: Normal rate and regular rhythm.     Heart sounds: Normal heart sounds. No murmur heard. Pulmonary:     Effort: Pulmonary effort is normal. No respiratory distress.     Breath sounds: Normal breath sounds. No wheezing.  Musculoskeletal:     Cervical back: Neck supple.  Skin:    General: Skin is warm and dry.     Comments: Some peeling sole of left foot and between toes.  Neurological:     Mental Status: She is alert and oriented to person, place, and time.  Psychiatric:        Mood and Affect: Mood normal.        Behavior: Behavior normal.        Thought Content: Thought content normal.        Judgment: Judgment normal.    BP 110/76 (BP Location: Right Arm, Patient  Position: Sitting, Cuff Size: Large)  Pulse 68    Temp 98.1 F (36.7 C) (Oral)    Resp 16    Ht 5\' 7"  (1.702 m)    Wt 204 lb 6.4 oz (92.7 kg)    LMP 11/29/2015    SpO2 99%    BMI 32.01 kg/m  Wt Readings from Last 3 Encounters:  03/03/21 204 lb 6.4 oz (92.7 kg)  08/26/20 230 lb 3.2 oz (104.4 kg)  10/30/19 228 lb (103.4 kg)       Assessment & Plan:   Problem List Items Addressed This Visit       Unprioritized   Hypothyroidism - Primary (Chronic)   Relevant Medications   levothyroxine (SYNTHROID) 50 MCG tablet   Other Relevant Orders   TSH   Tinea pedis of left foot    Repeat diflucan. She only took one tablet last time the other two got lost.       Relevant Medications   fluconazole (DIFLUCAN) 150 MG tablet   Obesity with body mass index 30 or greater    She has been working hard with the Weight loss clinic and I commended her on her weight loss.       Hyperlipidemia    Tolerating statin, obtain follow up lipid panel.       Relevant Medications   atorvastatin (LIPITOR) 20 MG tablet   Other Relevant Orders   Lipid panel   Herpes simplex    Stable on Valtrex, management per GYN.       Relevant Medications   fluconazole (DIFLUCAN) 150 MG tablet    I am having Diana Decker start on fluconazole. I am also having her maintain her multivitamin, valACYclovir, psyllium, carbamazepine, Latuda, atorvastatin, and levothyroxine.  Meds ordered this encounter  Medications   fluconazole (DIFLUCAN) 150 MG tablet    Sig: Take 1 tablet by mouth once weekly for 3 weeks    Dispense:  3 tablet    Refill:  0    Order Specific Question:   Supervising Provider    Answer:   Penni Homans A [[id number]]   atorvastatin (LIPITOR) 20 MG tablet    Sig: Take 1 tablet (20 mg total) by mouth daily.    Dispense:  90 tablet    Refill:  1    Order Specific Question:   Supervising Provider    Answer:   Penni Homans A [[id number]]   levothyroxine (SYNTHROID) 50 MCG tablet    Sig: TAKE 1 TABLET BY  MOUTH EVERY DAY BEFORE BREAKFAST    Dispense:  90 tablet    Refill:  1    Order Specific Question:   Supervising Provider    Answer:   Penni Homans A [[id number]]

## 2021-03-29 ENCOUNTER — Telehealth: Payer: Self-pay | Admitting: Psychiatry

## 2021-03-29 NOTE — Telephone Encounter (Signed)
Pt LVM that she is experiencing depressive symptoms and needs to talk to [place] or the nurse to seee what can be done. If she can get in sooner she would like to see [place].  Please give her a call at [phone]

## 2021-03-29 NOTE — Telephone Encounter (Signed)
Called patient. She is having suicidal thoughts but has no plan. She said thoughts happen mostly at night while she is laying in bed. She said she had not had these thoughts in a while and it was kind of nice.  She is having "negative perceptions." She said work is stressful, but it usually is. She could not provide a trigger for current sx. She also c/o a "depression headache". The pain is all over and not relieved by OTC medications.  She was able to laugh a couple of times during our conversation. She said she is stable, but did want to be seen sooner than her scheduled F/U.  She said she is aware of the [facility] or [facility] should she need it.

## 2021-03-29 NOTE — Telephone Encounter (Signed)
It looks like her apt was moved up to Friday. Was she needing a call before then or was she planning to discuss symptoms further during her apt?

## 2021-03-29 NOTE — Telephone Encounter (Signed)
She will discuss issues with you on Friday. I was just trying to get some background info for you. She is not in crisis.

## 2021-03-29 NOTE — Telephone Encounter (Signed)
Pt is scheduled to come in on friday

## 2021-03-31 ENCOUNTER — Encounter: Payer: Self-pay | Admitting: Psychiatry

## 2021-03-31 ENCOUNTER — Ambulatory Visit (INDEPENDENT_AMBULATORY_CARE_PROVIDER_SITE_OTHER): Payer: BC Managed Care – PPO | Admitting: Psychiatry

## 2021-03-31 ENCOUNTER — Other Ambulatory Visit: Payer: Self-pay

## 2021-03-31 DIAGNOSIS — F3162 Bipolar disorder, current episode mixed, moderate: Secondary | ICD-10-CM | POA: Diagnosis not present

## 2021-03-31 DIAGNOSIS — F3176 Bipolar disorder, in full remission, most recent episode depressed: Secondary | ICD-10-CM

## 2021-03-31 MED ORDER — CARBAMAZEPINE ER 200 MG PO CP12: ORAL_CAPSULE | ORAL | 0 refills | Status: DC

## 2021-03-31 NOTE — Progress Notes (Signed)
Diana Decker [id number] 1965-08-25 56 y.o.  Subjective:   Patient ID:  Diana Decker is a 56 y.o. (DOB 1965-10-14) female.  Chief Complaint:  Chief Complaint  Patient presents with   Depression    Passive suicidal thoughts    Depression       Diana Decker presents to the office today emergently for worsening mood and anxiety. She reports, "I'm just not doing good." She reports that this is the second week she has not been feeling well. She reports difficulty getting up and getting out of bed in the morning. She reports passive suicidal thoughts without plan or intent. She reports that passive suicidal thoughts "do not stick around but they keep coming back." She reports negative thoughts.   She reports that she has been taking things personally at work and her supervisor talked with her about being dismissive of a coworker's solutions and commented about her tone of voice. She reports that she has had "a lot of frustration" and others at works are identifying irritability. She reports that she has been tearful. She reports that this week she has felt consistently like she is on the verge of tears. She reports that she has had some anxiety. She reports that her husband baked a cake for a church bake off and had anxiety about where she was supposed to drop off the cake, when,concern about cake sitting out, etc. Denies recent panic. She reports that she has had ongoing increased spending (online shopping, buying new clothes). She reports that she has not been sleeping well due to menopause s/s (feeling hot, sweats, etc). She reports that she has not been able to sleep throughout the night. Energy has been low in the morning and this is unusual for her because she is usually a morning person. She reports energy and motivation has been ok after the early morning. Concentration is "ok" but not as good due to rumination.   She has lost 40 lbs with weight management program at [facility]. Has  ben going to the gym 3 days a week and yoga 2 days a week. Denies any food restriction. She reports that there are days it has been difficult not giving into food cravings.   She had HA's on generic Carbamazepine and has resumed Equetro.   Joined a new church in January.   She started new job in August and reports that it has been challenging. She reports that she is not feeling supported. She reports that she does not get along well with coworkers. Taking care of 75 yo mother frequently and reports that it is difficult seeing mother aging and her sister "taking advantage of her."   Has not seen therapist recently. Has sought a new therapist and found a therapist with Saturday availability. Has apt on 04/08/21 with therapist virtually.   Past Psychiatric Medication Trials: Latuda- Had restless legs on higher doses.  Good response to 60 mg dose Equetro-effective for mood stabilization without visual changes Carbamazepine (generic)-had vision changes and drowsiness Abilify-effective.  Experience compulsive eating and spending.  Weight gain Saphris Depakote-some weight gain Lithium-took briefly Serzone Trazodone Lamictal-caused insomnia Melatonin  AIMS    Flowsheet Row Office Visit from 01/19/2021 in [place] Office Visit from 09/12/2018 in [place] Total Score 0 0      PHQ2-9    [place] Office Visit from 07/01/2017 in [facility] Visit from 05/29/2016 in [facility]  at [place]  PHQ-2 Total Score 0 4  PHQ-9 Total Score 2 13        Review of Systems:  Review of Systems  Musculoskeletal:  Negative for gait problem.  Neurological:  Negative for tremors.  Psychiatric/Behavioral:  Positive for depression.        Please refer to HPI   Medications: I have reviewed the patient's current medications.  Current Outpatient Medications  Medication Sig  Dispense Refill   atorvastatin (LIPITOR) 20 MG tablet Take 1 tablet (20 mg total) by mouth daily. 90 tablet 1   levothyroxine (SYNTHROID) 50 MCG tablet TAKE 1 TABLET BY MOUTH EVERY DAY BEFORE BREAKFAST 90 tablet 1   Lurasidone HCl (LATUDA) 120 MG TABS TAKE 1/2-1 TAB EVERY EVENING WITH A MEAL 90 tablet 0   Multiple Vitamin (MULTIVITAMIN) tablet Take 1 tablet by mouth daily.     psyllium (METAMUCIL) 58.6 % packet Take 1 packet by mouth daily.     valACYclovir (VALTREX) 1000 MG tablet Take 0.5 tablets (500 mg total) by mouth daily. 45 tablet 1   carbamazepine (EQUETRO) 200 MG CP12 12 hr capsule Take 2 capsules (400 mg total) by mouth every morning AND 3 capsules (600 mg total) at bedtime. 150 capsule 0   fluconazole (DIFLUCAN) 150 MG tablet Take 1 tablet by mouth once weekly for 3 weeks (Patient not taking: Reported on 03/31/2021) 3 tablet 0   No current facility-administered medications for this visit.    Medication Side Effects: None  Allergies: No Known Allergies  Past Medical History:  Diagnosis Date   Alcoholism ([place])    quit 09/23/1999   Allergy    Anemia    Anxiety    patient denies this dx   Bipolar 1 disorder ([place])    Borderline personality disorder ([place])    Depression    GERD (gastroesophageal reflux disease)    no current problems   Hyperlipidemia 08/19/2017   Hypothyroidism 06/08/2010   IBS (irritable bowel syndrome)    Preventative health care 08/26/2020   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, cancerous lesion removed    Past Medical History, Surgical history, Social history, and Family history were reviewed and updated as appropriate.   Please see review of systems for further details on the patient's review from today.   Objective:   Physical Exam:  LMP 11/29/2015   Physical Exam Constitutional:      General: She is not in acute distress. Musculoskeletal:        General: No deformity.  Neurological:     Mental Status: She is alert and oriented to  person, place, and time.     Coordination: Coordination normal.  Psychiatric:        Attention and Perception: Attention and perception normal. She does not perceive auditory or visual hallucinations.        Mood and Affect: Mood is anxious. Mood is not depressed. Affect is not labile, blunt, angry or inappropriate.        Speech: Speech normal.        Behavior: Behavior normal.        Thought Content: Thought content normal. Thought content is not paranoid or delusional. Thought content does not include homicidal or suicidal ideation. Thought content does not include homicidal or suicidal plan.        Cognition and Memory: Cognition and memory normal.        Judgment: Judgment normal.     Comments: Insight intact  Dysphoric mood    Lab Review:     Component Value Date/Time   NA 138 08/26/2020 1440   K 3.8 08/26/2020 1440   CL 100 08/26/2020 1440   CO2 28 08/26/2020 1440   GLUCOSE 128 (H) 08/26/2020 1440   BUN 15 08/26/2020 1440   CREATININE 0.78 08/26/2020 1440   CALCIUM 9.3 08/26/2020 1440   PROT 7.0 08/26/2020 1440   ALBUMIN 4.2 04/24/2019 1402   AST 15 08/26/2020 1440   ALT 21 08/26/2020 1440   ALKPHOS 89 04/24/2019 1402   BILITOT 0.2 08/26/2020 1440   GFRNONAA >90 07/11/2013 1511   GFRAA >90 07/11/2013 1511       Component Value Date/Time   WBC 5.0 04/24/2019 1402   RBC 3.86 (L) 04/24/2019 1402   HGB 12.6 04/24/2019 1402   HCT 37.6 04/24/2019 1402   PLT 281.0 04/24/2019 1402   MCV 97.3 04/24/2019 1402   MCH 32.7 03/18/2019 1450   MCHC 33.5 04/24/2019 1402   RDW 12.7 04/24/2019 1402   LYMPHSABS 2.1 04/24/2019 1402   MONOABS 0.4 04/24/2019 1402   EOSABS 0.2 04/24/2019 1402   BASOSABS 0.0 04/24/2019 1402    No results found for: POCLITH, LITHIUM   Lab Results  Component Value Date   CBMZ 8.0 03/18/2019     .res Assessment: Plan:    Pt seen for 30 minutes and time spent discussing recent s/s and possible treatment options. Discussed potential benefits,  risks, and side effects of increasing Carbamazepine to 400 mg in the morning and 600 mg at bedtime to improved s/s of a mixed episode. Discussed that Carbamazepine may also be helpful for anxiety and insomnia. Pt agrees to increase in Carbamazepine.  Will continue Latuda 60 mg daily at supper for mood s/s.  Pt to follow-up in 4 weeks or sooner if clinically indicated.  Patient advised to contact office with any questions, adverse effects, or acute worsening in signs and symptoms.   Diana Decker was seen today for depression.  Diagnoses and all orders for this visit:  Bipolar disorder, current episode mixed, moderate (HCC) -     carbamazepine (EQUETRO) 200 MG CP12 12 hr capsule; Take 2 capsules (400 mg total) by mouth every morning AND 3 capsules (600 mg total) at bedtime.     Please see After Visit Summary for patient specific instructions.  Future Appointments  Date Time Provider [place]  04/28/2021  1:15 PM Thayer Headings, PMHNP [facility] None  07/20/2021  8:30 AM Thayer Headings, PMHNP [facility] None    No orders of the defined types were placed in this encounter.   -------------------------------

## 2021-04-28 ENCOUNTER — Other Ambulatory Visit: Payer: Self-pay

## 2021-04-28 ENCOUNTER — Ambulatory Visit (INDEPENDENT_AMBULATORY_CARE_PROVIDER_SITE_OTHER): Payer: BC Managed Care – PPO | Admitting: Psychiatry

## 2021-04-28 ENCOUNTER — Encounter: Payer: Self-pay | Admitting: Psychiatry

## 2021-04-28 VITALS — Wt 192.0 lb

## 2021-04-28 DIAGNOSIS — Z79899 Other long term (current) drug therapy: Secondary | ICD-10-CM | POA: Diagnosis not present

## 2021-04-28 DIAGNOSIS — F3177 Bipolar disorder, in partial remission, most recent episode mixed: Secondary | ICD-10-CM | POA: Diagnosis not present

## 2021-04-28 MED ORDER — LURASIDONE HCL 120 MG PO TABS: ORAL_TABLET | ORAL | 0 refills | Status: DC

## 2021-04-28 MED ORDER — CARBAMAZEPINE ER 200 MG PO CP12: ORAL_CAPSULE | ORAL | 1 refills | Status: DC

## 2021-04-28 NOTE — Progress Notes (Signed)
Bridey Brookover ?[id number] ?March 16, 1965 ?56 y.o. ? ?Subjective:  ? ?Patient ID:  Diana Decker is a 56 y.o. (DOB 1965/12/23) female. ? ?Chief Complaint:  ?Chief Complaint  ?Patient presents with  ? Follow-up  ?  Depression, anxiety  ? ? ?HPI ?Diana Decker presents to the office today for follow-up of mood disturbance and anxiety. She reports that she has been feeling "much better." She reports improved depression. Reports minimal depression. She reports that irritability has improved and she is "getting along better" with coworkers. Energy and motivation have improved. She has started exercising again and going to her previous activities. She reports that spending has been ok. She has had intentional 43 lbs weight loss in 6 months. She would like to lose 15-20 lbs. Appetite has been ok. Denies any manic s/s.  ? ?Husband is getting ready to retire and this has caused stress. Husband has been at current job for almost 30 years. He has been considering other options after retirement. She had some anxiety and sleep disturbance prior to husband's retirement being finalized. She reports that her anxiety and sleep improved once his retirement was announced. She reports that anxiety has been minimal. Rumination has improved.  ? ?Reports that suicidal thoughts have happened on occasion and were shorter in duration and less intense.  ? ?Doing strength training 3 times a week.  ? ?Seeing a new therapist, Ernst Breach, who offers Saturday appointments.  ? ?Past Psychiatric Medication Trials: ?Latuda- Had restless legs on higher doses.  Good response to 60 mg dose ?Equetro-effective for mood stabilization without visual changes ?Carbamazepine (generic)-had vision changes and drowsiness ?Abilify-effective.  Experience compulsive eating and spending.  Weight gain. ?Saphris ?Depakote-some weight gain ?Lithium-took briefly ?Serzone ?Trazodone ?Lamictal-caused insomnia ?Melatonin ? ?AIMS   ? ?[place] Office Visit from  01/19/2021 in [place] Office Visit from 09/12/2018 in [place]  ?AIMS Total Score 0 0  ? ?  ? ?PHQ2-9   ? ?[place] Office Visit from 07/01/2017 in [facility] [place] Visit from 05/29/2016 in [facility]  ?PHQ-2 Total Score 0 4  ?PHQ-9 Total Score 2 13  ? ?  ?  ? ?Review of Systems:  ?Review of Systems  ?Eyes:   ?     No blurred vision  ?Musculoskeletal:  Negative for gait problem.  ?Skin:  Negative for rash.  ?Neurological:  Negative for headaches.  ?Psychiatric/Behavioral:    ?     Please refer to HPI  ? ?Medications: I have reviewed the patient's current medications. ? ?Current Outpatient Medications  ?Medication Sig Dispense Refill  ? atorvastatin (LIPITOR) 20 MG tablet Take 1 tablet (20 mg total) by mouth daily. 90 tablet 1  ? levothyroxine (SYNTHROID) 50 MCG tablet TAKE 1 TABLET BY MOUTH EVERY DAY BEFORE BREAKFAST 90 tablet 1  ? Multiple Vitamin (MULTIVITAMIN) tablet Take 1 tablet by mouth daily.    ? valACYclovir (VALTREX) 1000 MG tablet Take 0.5 tablets (500 mg total) by mouth daily. 45 tablet 1  ? carbamazepine (EQUETRO) 200 MG CP12 12 hr capsule Take 2 capsules (400 mg total) by mouth every morning AND 3 capsules (600 mg total) at bedtime. 450 capsule 1  ? fluconazole (DIFLUCAN) 150 MG tablet Take 1 tablet by mouth once weekly for 3 weeks (Patient not taking: Reported on 03/31/2021) 3 tablet 0  ? Lurasidone HCl (LATUDA) 120 MG TABS TAKE 1/2-1 TAB EVERY EVENING WITH A  MEAL 90 tablet 0  ? psyllium (METAMUCIL) 58.6 % packet Take 1 packet by mouth daily as needed.    ? ?No current facility-administered medications for this visit.  ? ? ?Medication Side Effects: None ? ?Allergies: No Known Allergies ? ?Past Medical History:  ?Diagnosis Date  ? Alcoholism ([place])   ? quit 09/23/1999  ? Allergy   ? Anemia   ? Anxiety   ? patient denies this dx  ? Bipolar 1 disorder ([place])   ? Borderline  personality disorder ([place])   ? Depression   ? GERD (gastroesophageal reflux disease)   ? no current problems  ? Hyperlipidemia 08/19/2017  ? Hypothyroidism 06/08/2010  ? IBS (irritable bowel syndrome)   ? Preventative health care 08/26/2020  ? 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 ? ?Past Medical History, Surgical history, Social history, and Family history were reviewed and updated as appropriate.  ? ?Please see review of systems for further details on the patient's review from today.  ? ?Objective:  ? ?Physical Exam:  ?Wt 192 lb (87.1 kg)   LMP 11/29/2015   BMI 30.07 kg/m?  ? ?Physical Exam ?Constitutional:   ?   General: She is not in acute distress. ?Musculoskeletal:     ?   General: No deformity.  ?Neurological:  ?   Mental Status: She is alert and oriented to person, place, and time.  ?   Coordination: Coordination normal.  ?Psychiatric:     ?   Attention and Perception: Attention and perception normal. She does not perceive auditory or visual hallucinations.     ?   Mood and Affect: Mood normal. Mood is not anxious or depressed. Affect is not labile, blunt, angry or inappropriate.     ?   Speech: Speech normal.     ?   Behavior: Behavior normal.     ?   Thought Content: Thought content normal. Thought content is not paranoid or delusional. Thought content does not include homicidal or suicidal ideation. Thought content does not include homicidal or suicidal plan.     ?   Cognition and Memory: Cognition and memory normal.     ?   Judgment: Judgment normal.  ?   Comments: Insight intact  ? ? ?Lab Review:  ?   ?Component Value Date/Time  ? NA 138 08/26/2020 1440  ? K 3.8 08/26/2020 1440  ? CL 100 08/26/2020 1440  ? CO2 28 08/26/2020 1440  ? GLUCOSE 128 (H) 08/26/2020 1440  ? BUN 15 08/26/2020 1440  ? CREATININE 0.78 08/26/2020 1440  ? CALCIUM 9.3 08/26/2020 1440  ? PROT 7.0 08/26/2020 1440  ? ALBUMIN 4.2 04/24/2019 1402  ? AST 15 08/26/2020 1440  ? ALT 21 08/26/2020 1440  ?  ALKPHOS 89 04/24/2019 1402  ? BILITOT 0.2 08/26/2020 1440  ? GFRNONAA >90 07/11/2013 1511  ? GFRAA >90 07/11/2013 1511  ? ? ?   ?Component Value Date/Time  ? WBC 5.0 04/24/2019 1402  ? RBC 3.86 (L) 04/24/2019 1402  ? HGB 12.6 04/24/2019 1402  ? HCT 37.6 04/24/2019 1402  ? PLT 281.0 04/24/2019 1402  ? MCV 97.3 04/24/2019 1402  ? MCH 32.7 03/18/2019 1450  ? MCHC 33.5 04/24/2019 1402  ? RDW 12.7 04/24/2019 1402  ? LYMPHSABS 2.1 04/24/2019 1402  ? MONOABS 0.4 04/24/2019 1402  ? EOSABS 0.2 04/24/2019 1402  ? BASOSABS 0.0 04/24/2019 1402  ? ? ?No results found for: POCLITH, LITHIUM  ? ?  Lab Results  ?Component Value Date  ? CBMZ 8.0 03/18/2019  ?  ? ?.res ?Assessment: Plan:   ?Pt seen for 30 minutes and time spent discussing response to recent increase in Equetro and need for lab monitoring. Will order CBC and CMP levels to monitor for potential adverse effects with Equetro. Will also order Carbamazepine level. ?Will continue current plan of care since target signs and symptoms are well controlled without any tolerability issues. ?Continue Equetro 400 mg po q am and 600 mg at bedtime for mood symptoms.  ?Continue Latuda 60 mg daily with supper for mood s/s.  ?Pt to follow-up in 3 months or sooner if clinically indicated.  ?Patient advised to contact office with any questions, adverse effects, or acute worsening in signs and symptoms. ? ?Basha was seen today for follow-up. ? ?Diagnoses and all orders for this visit: ? ?Bipolar disorder, in partial remission, most recent episode mixed (HCC) ?-     carbamazepine (EQUETRO) 200 MG CP12 12 hr capsule; Take 2 capsules (400 mg total) by mouth every morning AND 3 capsules (600 mg total) at bedtime. ?-     Lurasidone HCl (LATUDA) 120 MG TABS; TAKE 1/2-1 TAB EVERY EVENING WITH A MEAL ? ?High risk medication use ?-     Comprehensive metabolic panel ?-     CBC ?-     Carbamazepine level, total ? ?  ? ?Please see After Visit Summary for patient specific instructions. ? ?Future  Appointments  ?Date Time Provider [place]  ?07/20/2021  8:30 AM Thayer Headings, PMHNP [facility] None  ? ? ?Orders Placed This Encounter  ?Procedures  ? Comprehensive metabolic panel  ? CBC  ? Carbamazepine level, total  ? ? ?-

## 2021-05-03 LAB — CBC
HCT: 38.5 % (ref 35.0–45.0)
Hemoglobin: 12.8 g/dL (ref 11.7–15.5)
MCH: 32.6 pg (ref 27.0–33.0)
MCHC: 33.2 g/dL (ref 32.0–36.0)
MCV: 98 fL (ref 80.0–100.0)
MPV: 11.1 fL (ref 7.5–12.5)
Platelets: 277 10*3/uL (ref 140–400)
RBC: 3.93 10*6/uL (ref 3.80–5.10)
RDW: 11.4 % (ref 11.0–15.0)
WBC: 4.4 10*3/uL (ref 3.8–10.8)

## 2021-05-03 LAB — COMPREHENSIVE METABOLIC PANEL
AG Ratio: 1.4 (calc) (ref 1.0–2.5)
ALT: 16 U/L (ref 6–29)
AST: 14 U/L (ref 10–35)
Albumin: 4.2 g/dL (ref 3.6–5.1)
Alkaline phosphatase (APISO): 76 U/L (ref 37–153)
BUN: 17 mg/dL (ref 7–25)
CO2: 30 mmol/L (ref 20–32)
Calcium: 9.6 mg/dL (ref 8.6–10.4)
Chloride: 97 mmol/L — ABNORMAL LOW (ref 98–110)
Creat: 0.64 mg/dL (ref 0.50–1.03)
Globulin: 3 g/dL (calc) (ref 1.9–3.7)
Glucose, Bld: 90 mg/dL (ref 65–99)
Potassium: 4.1 mmol/L (ref 3.5–5.3)
Sodium: 134 mmol/L — ABNORMAL LOW (ref 135–146)
Total Bilirubin: 0.3 mg/dL (ref 0.2–1.2)
Total Protein: 7.2 g/dL (ref 6.1–8.1)

## 2021-05-03 LAB — CARBAMAZEPINE LEVEL, TOTAL: Carbamazepine Lvl: 6.7 mg/L (ref 4.0–12.0)

## 2021-07-13 ENCOUNTER — Ambulatory Visit: Payer: Managed Care, Other (non HMO) | Admitting: Podiatry

## 2021-07-19 ENCOUNTER — Ambulatory Visit (INDEPENDENT_AMBULATORY_CARE_PROVIDER_SITE_OTHER): Payer: Managed Care, Other (non HMO)

## 2021-07-19 ENCOUNTER — Telehealth (INDEPENDENT_AMBULATORY_CARE_PROVIDER_SITE_OTHER): Payer: Managed Care, Other (non HMO) | Admitting: Psychiatry

## 2021-07-19 ENCOUNTER — Ambulatory Visit (INDEPENDENT_AMBULATORY_CARE_PROVIDER_SITE_OTHER): Payer: Managed Care, Other (non HMO) | Admitting: Podiatry

## 2021-07-19 ENCOUNTER — Encounter: Payer: Self-pay | Admitting: Psychiatry

## 2021-07-19 ENCOUNTER — Encounter: Payer: Self-pay | Admitting: Podiatry

## 2021-07-19 VITALS — Wt 185.0 lb

## 2021-07-19 DIAGNOSIS — F5105 Insomnia due to other mental disorder: Secondary | ICD-10-CM | POA: Diagnosis not present

## 2021-07-19 DIAGNOSIS — M7752 Other enthesopathy of left foot: Secondary | ICD-10-CM

## 2021-07-19 DIAGNOSIS — F99 Mental disorder, not otherwise specified: Secondary | ICD-10-CM

## 2021-07-19 DIAGNOSIS — F3177 Bipolar disorder, in partial remission, most recent episode mixed: Secondary | ICD-10-CM

## 2021-07-19 DIAGNOSIS — M779 Enthesopathy, unspecified: Secondary | ICD-10-CM

## 2021-07-19 MED ORDER — TRIAMCINOLONE ACETONIDE 10 MG/ML IJ SUSP
10.0000 mg | Freq: Once | INTRAMUSCULAR | Status: AC
  Administered 2021-07-19: 10 mg

## 2021-07-19 MED ORDER — CARBAMAZEPINE ER 200 MG PO CP12: ORAL_CAPSULE | ORAL | 1 refills | Status: DC

## 2021-07-19 MED ORDER — LURASIDONE HCL 120 MG PO TABS: ORAL_TABLET | ORAL | 0 refills | Status: DC

## 2021-07-19 NOTE — Progress Notes (Signed)
Diana Decker [id number] [birth year] 56 y.o.  Virtual Visit via Video Note  I connected with pt @ on 07/19/21 at  1:15 PM EDT by a video enabled telemedicine application and verified that I am speaking with the correct person using two identifiers.   I discussed the limitations of evaluation and management by telemedicine and the availability of in person appointments. The patient expressed understanding and agreed to proceed.  I discussed the assessment and treatment plan with the patient. The patient was provided an opportunity to ask questions and all were answered. The patient agreed with the plan and demonstrated an understanding of the instructions.   The patient was advised to call back or seek an in-person evaluation if the symptoms worsen or if the condition fails to improve as anticipated.  I provided 25 minutes of non-face-to-face time during this encounter.  The patient was located in her personal vehicle outside of her office in [place].  The provider was located at home.   Thayer Headings, PMHNP   Subjective:   Patient ID:  Diana Decker is a 56 y.o. (DOB [birth year]) female.  Chief Complaint:  Chief Complaint  Patient presents with   Follow-up    Mood disturbance, anxiety    HPI Diana Decker presents for follow-up of mood disturbance and anxiety. She reports that her mood has been stable overall. She reports that she has been walking regularly and some strength training and feels that this has been helpful for her mood. Taking a walk at times during her lunch break. Denies depressed mood. She reports that she has had some increased spending, which is also related to weight loss and needing smaller clothing. She reports that at times she has been spending excessively, ie. Buying 2-3 pairs of jeans instead of 1 pair. Denies excessive spending on things other than clothes or shoes. She reports that she has not been spending more than she can afford. She reports that she has not  spent extravagant amounts and will hold on to receipts and keep tags on clothing in case she wants to return something. Denies impulsivity or thoughts of wanting to use. She reports some irritability with coworkers at times when coworkers are not doing things the way she thinks they should. She reports that she is not acting on these feelings and is getting along better with coworkers. She reports that her supervisor told her that interaction with coworkers has improved. 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 She has been going to different trainings and reading what she can about the subject. She is having some anxiety around husband's retirement. She describes her sleep as "ok." She is awakening at times to urinate. Occ waking up and thinking about work or relationship issues. Energy and motivation have been good. She reports that has been wanting to go to the gym. Concentration has been good. Reports that suicidal thoughts are less frequent and "pass pretty quickly." Denies current SI.   She has lost 49 lbs intentionally. She would like to lose another 20 lbs.   She has been on 3 trips in the last 6-8 weeks.   She reports that her work is going ok overall. 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 Husband will retire June 15th. He will continue to work some.   Continues to see therapist virtually on Saturday mornings.   Past Psychiatric Medication Trials: Latuda- Had restless legs on higher doses.  Good response to 60 mg dose Equetro-effective  for mood stabilization without visual changes Carbamazepine (generic)-had vision changes and drowsiness Abilify-effective.  Experience compulsive eating and spending.  Weight gain. Saphris Depakote-some weight gain Lithium-took briefly Serzone Trazodone Lamictal-caused insomnia Melatonin   Review of Systems:  Review of Systems  Gastrointestinal: Negative.   Musculoskeletal:  Negative for gait problem.       Foot  pain  Neurological:  Negative for tremors and headaches.  Psychiatric/Behavioral:         Please refer to HPI   Medications: I have reviewed the patient's current medications.  Current Outpatient Medications  Medication Sig Dispense Refill   atorvastatin (LIPITOR) 20 MG tablet Take 1 tablet (20 mg total) by mouth daily. 90 tablet 1   levothyroxine (SYNTHROID) 50 MCG tablet TAKE 1 TABLET BY MOUTH EVERY DAY BEFORE BREAKFAST 90 tablet 1   Multiple Vitamin (MULTIVITAMIN) tablet Take 1 tablet by mouth daily.     psyllium (METAMUCIL) 58.6 % packet Take 1 packet by mouth daily as needed.     valACYclovir (VALTREX) 1000 MG tablet Take 0.5 tablets (500 mg total) by mouth daily. 45 tablet 1   carbamazepine (EQUETRO) 200 MG CP12 12 hr capsule Take 2 capsules (400 mg total) by mouth every morning AND 3 capsules (600 mg total) at bedtime. 450 capsule 1   fluconazole (DIFLUCAN) 150 MG tablet Take 1 tablet by mouth once weekly for 3 weeks (Patient not taking: Reported on 03/31/2021) 3 tablet 0   Lurasidone HCl (LATUDA) 120 MG TABS TAKE 1/2-1 TAB EVERY EVENING WITH A MEAL 90 tablet 0   No current facility-administered medications for this visit.    Medication Side Effects: None  Allergies: No Known Allergies  Past Medical History:  Diagnosis Date   Alcoholism ([place])    quit 09/23/1999   Allergy    Anemia    Anxiety    patient denies this dx   Bipolar 1 disorder ([place])    Borderline personality disorder ([place])    Depression    GERD (gastroesophageal reflux disease)    no current problems   Hyperlipidemia 08/19/2017   Hypothyroidism 06/08/2010   IBS (irritable bowel syndrome)    Preventative health care 08/26/2020   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, cancerous lesion removed    Family History  Problem Relation Age of Onset   Breast cancer Mother    Depression Mother    Prostate cancer Father    Heart disease Father    Alcohol abuse Father    Depression Father    Anxiety disorder  Father    Cancer Maternal Grandfather        lung?   Depression Maternal Uncle    Alcohol abuse Maternal Uncle    Suicidality Maternal Uncle    Alcohol abuse Half-Sister    Bipolar disorder Half-Sister    Obesity Half-Sister    Diabetes type II Half-Sister    Colon cancer Neg Hx    Rectal cancer Neg Hx    Stomach cancer Neg Hx     Social History   Socioeconomic History   Marital status: Married    Spouse name: Not on file   Number of children: Not on file   Years of education: Not on file   Highest education level: Not on file  Occupational History   Occupation: Hospital doctor  Tobacco Use   Smoking status: Never   Smokeless tobacco: Never  Vaping Use   Vaping Use: Never used  Substance and Sexual Activity  Alcohol use: No   Drug use: No   Sexual activity: Yes    Birth control/protection: None    Comment: husband with vasectomy  Other Topics Concern   Not on file  Social History Narrative   Regular exercise-yes   Works as a Education officer, 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    Social Determinants of Health   Financial Resource Strain: Not on file  Food Insecurity: Not on file  Transportation Needs: Not on file  Physical Activity: Not on file  Stress: Not on file  Social Connections: Not on file  Intimate Partner Violence: Not on file    Past Medical History, Surgical history, Social history, and Family history were reviewed and updated as appropriate.   Please see review of systems for further details on the patient's review from today.   Objective:   Physical Exam:  Wt 185 lb (83.9 kg)   LMP 11/29/2015   BMI 28.98 kg/m   Physical Exam Neurological:     Mental Status: She is alert and oriented to person, place, and time.     Cranial Nerves: No dysarthria.  Psychiatric:        Attention and Perception: Attention and perception normal.        Mood and Affect: Mood normal.        Speech: Speech normal.        Behavior:  Behavior is cooperative.        Thought Content: Thought content normal. Thought content is not paranoid or delusional. Thought content does not include homicidal or suicidal ideation. Thought content does not include homicidal or suicidal plan.        Cognition and Memory: Cognition and memory normal.        Judgment: Judgment normal.     Comments: Insight intact    Lab Review:     Component Value Date/Time   NA 134 (L) 05/02/2021 1501   K 4.1 05/02/2021 1501   CL 97 (L) 05/02/2021 1501   CO2 30 05/02/2021 1501   GLUCOSE 90 05/02/2021 1501   BUN 17 05/02/2021 1501   CREATININE 0.64 05/02/2021 1501   CALCIUM 9.6 05/02/2021 1501   PROT 7.2 05/02/2021 1501   ALBUMIN 4.2 04/24/2019 1402   AST 14 05/02/2021 1501   ALT 16 05/02/2021 1501   ALKPHOS 89 04/24/2019 1402   BILITOT 0.3 05/02/2021 1501   GFRNONAA >90 07/11/2013 1511   GFRAA >90 07/11/2013 1511       Component Value Date/Time   WBC 4.4 05/02/2021 1501   RBC 3.93 05/02/2021 1501   HGB 12.8 05/02/2021 1501   HCT 38.5 05/02/2021 1501   PLT 277 05/02/2021 1501   MCV 98.0 05/02/2021 1501   MCH 32.6 05/02/2021 1501   MCHC 33.2 05/02/2021 1501   RDW 11.4 05/02/2021 1501   LYMPHSABS 2.1 04/24/2019 1402   MONOABS 0.4 04/24/2019 1402   EOSABS 0.2 04/24/2019 1402   BASOSABS 0.0 04/24/2019 1402    No results found for: POCLITH, LITHIUM   Lab Results  Component Value Date   CBMZ 6.7 05/02/2021     .res Assessment: Plan:    Pt seen for 25 minutes and time spent discussing recent symptoms and ongoing treatment plan. She reports that overall her mood has been stable and she would like to continue current medications without changes at this time.  Will continue Equetro 400 mg in the morning and 600 mg at bedtime for mood stabilization.  Continue Latuda 60  mg daily for mood stabilization.  Recommend continuing psychotherapy.  Pt to follow-up in 4 months or sooner if clinically indicated.  Patient advised to contact  office with any questions, adverse effects, or acute worsening in signs and symptoms.   Doll was seen today for follow-up.  Diagnoses and all orders for this visit:  Bipolar disorder, in partial remission, most recent episode mixed (HCC) -     carbamazepine (EQUETRO) 200 MG CP12 12 hr capsule; Take 2 capsules (400 mg total) by mouth every morning AND 3 capsules (600 mg total) at bedtime. -     Lurasidone HCl (LATUDA) 120 MG TABS; TAKE 1/2-1 TAB EVERY EVENING WITH A MEAL  Insomnia due to other mental disorder     Please see After Visit Summary for patient specific instructions.  Future Appointments  Date Time Provider [place]  08/18/2021  8:45 AM Regal, Tamala Fothergill, DPM [facility]    No orders of the defined types were placed in this encounter.     -------------------------------

## 2021-07-19 NOTE — Progress Notes (Signed)
Subjective:   Patient ID: Diana Decker, female   DOB: 56 y.o.   MRN: [id number]   HPI Patient states she started develop a lot of pain in her left big toe joint does not remember specific injury but states that it is very sore and its been going on for about 2 months.  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  She is tried wider shoes she is tried soaks she has tried cushioning devices and at this point I did go ahead I did sterile prep injected the first MPJ 3 mg Kenalog 5 mg Xylocaine advised on wider shoes to be continued and soaks this if symptoms do not get better will require distal osteotomy  X-rays indicate there is an elevation of the intermetatarsal angle approximately 15 degrees with bone spur formation around the first metatarsal head left

## 2021-07-20 ENCOUNTER — Telehealth: Payer: BC Managed Care – PPO | Admitting: Psychiatry

## 2021-08-18 ENCOUNTER — Encounter: Payer: Self-pay | Admitting: Podiatry

## 2021-08-18 ENCOUNTER — Ambulatory Visit (INDEPENDENT_AMBULATORY_CARE_PROVIDER_SITE_OTHER): Payer: Self-pay | Admitting: Podiatry

## 2021-08-18 DIAGNOSIS — M21612 Bunion of left foot: Secondary | ICD-10-CM

## 2021-08-18 DIAGNOSIS — M21619 Bunion of unspecified foot: Secondary | ICD-10-CM

## 2021-08-19 NOTE — Progress Notes (Signed)
Subjective:   Patient ID: Diana Decker, female   DOB: 56 y.o.   MRN: [id number]   HPI Patient states that her big toe joint left remains very tender and she is not able to take care of it and she has tried wider shoes she has tried modalities and we tried injection treatment which did not give her relief   ROS      Objective:  Physical Exam  Neurovascular status intact with patient found to have enlargement around the first metatarsal head left with redness around the joint surface and pain and minimal restriction no crepitus of the joint noted.  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  Due to inability to respond conservatively surgical intervention has been recommended and patient wants this done and I explained the procedure to patient and risk associated with surgery.  She is willing to accept risk wants procedure done and I went ahead today and I allowed her to read the consent form going over all possible complications alternative treatments and after extensive review she signed and I did explain there is no guarantee that may not be some arthritis in the joint and that we will not be able to address this.  Scheduled for outpatient surgery  Reviewed also utilization of air fracture walker which was dispensed and fitted properly to her foot and patient will bring that to surgery and wear it prior to surgery to get used to

## 2021-08-24 ENCOUNTER — Telehealth: Payer: Self-pay | Admitting: Urology

## 2021-08-24 NOTE — Telephone Encounter (Signed)
DOS - 09/12/21  [place] BUNIONECTOMY LEFT --- 93570  [employer] EFFECTIVE DATE - 06/12/21  PLAN DEDUCTIBLE - $3,000.00 W/ $2,235.70 REMAINING OUT OF POCKET -  $3,000.00 W/ $2,235.70 REMAINING COINSURANCE - 0% COPAY - $0.00  PER [employer]'S AUTOMATIVE SYSTEM FOR CPT CODE 17793 NO PRIOR AUTH IS REQUIRED.  REF # [id number]

## 2021-09-11 MED ORDER — ONDANSETRON HCL 4 MG PO TABS: 4.0000 mg | ORAL_TABLET | Freq: Three times a day (TID) | ORAL | 0 refills | Status: DC | PRN

## 2021-09-11 MED ORDER — OXYCODONE-ACETAMINOPHEN 10-325 MG PO TABS: 1.0000 | ORAL_TABLET | Freq: Four times a day (QID) | ORAL | 0 refills | Status: DC | PRN

## 2021-09-11 NOTE — Addendum Note (Signed)
Addended by: Wallene Huh on: 09/11/2021 01:45 PM   Modules accepted: Orders

## 2021-09-12 ENCOUNTER — Encounter: Payer: Self-pay | Admitting: Podiatry

## 2021-09-12 DIAGNOSIS — M2012 Hallux valgus (acquired), left foot: Secondary | ICD-10-CM | POA: Diagnosis not present

## 2021-09-18 ENCOUNTER — Ambulatory Visit (INDEPENDENT_AMBULATORY_CARE_PROVIDER_SITE_OTHER): Payer: Managed Care, Other (non HMO)

## 2021-09-18 ENCOUNTER — Encounter: Payer: Managed Care, Other (non HMO) | Admitting: Podiatry

## 2021-09-18 ENCOUNTER — Encounter: Payer: Self-pay | Admitting: Podiatry

## 2021-09-18 ENCOUNTER — Ambulatory Visit (INDEPENDENT_AMBULATORY_CARE_PROVIDER_SITE_OTHER): Payer: Managed Care, Other (non HMO) | Admitting: Podiatry

## 2021-09-18 DIAGNOSIS — Z9889 Other specified postprocedural states: Secondary | ICD-10-CM | POA: Diagnosis not present

## 2021-09-18 DIAGNOSIS — M2012 Hallux valgus (acquired), left foot: Secondary | ICD-10-CM

## 2021-09-20 ENCOUNTER — Encounter (INDEPENDENT_AMBULATORY_CARE_PROVIDER_SITE_OTHER): Payer: Self-pay

## 2021-09-20 ENCOUNTER — Telehealth: Payer: Self-pay | Admitting: Podiatry

## 2021-09-20 NOTE — Telephone Encounter (Signed)
The patient called the answering service, she took her first shower today after surgery and noticed purple and blue splotchiness around the foot and incision.  There is no new drainage or issues with the incision itself no areas of fluctuance or raised bumps.  The description sounds like it livedo reticularis type reaction, advised her this is common after surgery especially with temperature changes and she will take the day off work and ice and elevate the foot.  Advised her if it begins to hurt more or she has significant swelling or redness or streaking to let us know for a nursing visit to be scheduled

## 2021-09-20 NOTE — Progress Notes (Signed)
Subjective:   Patient ID: Diana Decker, female   DOB: 56 y.o.   MRN: [id number]   HPI Patient states doing very well with surgery very pleased so far with minimal discomfort   ROS      Objective:  Physical Exam  Neurovascular status intact negative Bevelyn Buckles' sign noted left foot healing well wound edges well coapted good range of motion stitches intact     Assessment:  Doing well post osteotomy first metatarsal left     Plan:  Reviewed condition and recommended to continue of elevation and compression as needed along with surgical shoe uses that was dispensed today continue boot usage and the patient will be seen back to recheck in the next 3  X-rays indicate that there is good healing of the osteotomy fixation in place joint congruence

## 2021-09-26 ENCOUNTER — Telehealth: Payer: Self-pay

## 2021-09-26 NOTE — Telephone Encounter (Signed)
No additional notes needed  

## 2021-09-27 NOTE — Telephone Encounter (Signed)
Looks like she has appointment with me on the 6th

## 2021-10-01 ENCOUNTER — Other Ambulatory Visit: Payer: Self-pay | Admitting: Podiatry

## 2021-10-01 ENCOUNTER — Telehealth: Payer: Self-pay | Admitting: Podiatry

## 2021-10-01 MED ORDER — CEPHALEXIN 500 MG PO CAPS: 500.0000 mg | ORAL_CAPSULE | Freq: Three times a day (TID) | ORAL | 0 refills | Status: DC

## 2021-10-01 NOTE — Telephone Encounter (Signed)
Patient called the answering service stating that she thinks her incision is infection. She states that there is some drainage and welling. It is "reddish", stating it is more red than normal but not overly red. No fevers or chills. I Have sent Keflex to the pharmacy for her.   Estill Bamberg, can you please get her in this week?   Thanks.

## 2021-10-02 NOTE — Telephone Encounter (Signed)
Attempted to follow up with the patient. Left voicemail for the patient to call the office back and to get scheduled with Dr. Paulla Dolly per Dr. Jacqualyn Posey this week.

## 2021-10-02 NOTE — Telephone Encounter (Signed)
Could you check on her today and if not improved she can come in

## 2021-10-03 ENCOUNTER — Telehealth: Payer: Self-pay | Admitting: Podiatry

## 2021-10-03 NOTE — Telephone Encounter (Signed)
Noted, thanks!

## 2021-10-03 NOTE — Telephone Encounter (Signed)
Spoke w/ pt about setting up an appt this week to be seen by Dr. Paulla Dolly; pt states that the her issue w/ her foot has improved since she has been taking the RX that was called in by Dr. Jacqualyn Posey and no longer needs to come for office visit.

## 2021-10-05 ENCOUNTER — Ambulatory Visit (INDEPENDENT_AMBULATORY_CARE_PROVIDER_SITE_OTHER): Payer: Managed Care, Other (non HMO) | Admitting: Podiatry

## 2021-10-05 ENCOUNTER — Encounter: Payer: Self-pay | Admitting: Podiatry

## 2021-10-05 ENCOUNTER — Ambulatory Visit (INDEPENDENT_AMBULATORY_CARE_PROVIDER_SITE_OTHER): Payer: Managed Care, Other (non HMO)

## 2021-10-05 DIAGNOSIS — M2012 Hallux valgus (acquired), left foot: Secondary | ICD-10-CM | POA: Diagnosis not present

## 2021-10-05 DIAGNOSIS — Z9889 Other specified postprocedural states: Secondary | ICD-10-CM

## 2021-10-05 MED ORDER — CEPHALEXIN 500 MG PO CAPS: 500.0000 mg | ORAL_CAPSULE | Freq: Three times a day (TID) | ORAL | 1 refills | Status: DC

## 2021-10-05 NOTE — Progress Notes (Signed)
Subjective:   Patient ID: Diana Decker, female   DOB: 56 y.o.   MRN: [id number]   HPI Patient states she has some light redness around the metatarsal head left she has been quite active on her foot wearing her surgical shoe and felt like she had some drainage at the end of the incision site.  She is been on cephalexin it seems some improved since then but still wants it checked   ROS      Objective:  Physical Exam  Neurovascular status intact negative Bevelyn Buckles' sign noted wound edges left are healing well there is a slight bit of crusted tissue in the distal portion of the incision near where we finished the incision site but is local there is no active drainage minimal erythema surrounding this area no proximal edema erythema drainage noted     Assessment:  May have very low-grade distal infection secondary to activity levels or it may just be an inflammatory complex     Plan:  X-rays taken reviewed I went ahead today and I want her to go back into the boot she can start soaks I want her to wear a Band-Aid on it with Neosporin on the distal portion of the incision site there is no indications of dehiscence and we will continue the cephalexin for 1 more week.  Strict instructions if any further redness swelling drainage she is to let us know immediately if not she will be seen back regular visit in 2 weeks  X-rays were negative for signs of pathology fixation in place joint congruence

## 2021-10-10 ENCOUNTER — Ambulatory Visit (INDEPENDENT_AMBULATORY_CARE_PROVIDER_SITE_OTHER): Payer: Managed Care, Other (non HMO) | Admitting: Family

## 2021-10-10 VITALS — BP 120/76 | HR 55 | Temp 97.8°F | Resp 16 | Wt 185.0 lb

## 2021-10-10 DIAGNOSIS — R55 Syncope and collapse: Secondary | ICD-10-CM

## 2021-10-10 DIAGNOSIS — E785 Hyperlipidemia, unspecified: Secondary | ICD-10-CM

## 2021-10-10 HISTORY — DX: Syncope and collapse: R55

## 2021-10-10 NOTE — Progress Notes (Signed)
 Subjective:     Patient ID: Diana Decker, female    DOB: 01/09/1966, 56 y.o.   MRN: [id number]  Chief Complaint  Patient presents with   Loss of Consciousness    Patient reports passing out on Friday, was evaluated by ems    Loss of Consciousness   Patient pulled into [employer] parking lot, felt hot and very nauseaus. She was walking across the parking lot.  Lost consciousness. Fell into the bushes. She thinks she had a brief LOC.  Bystanders helped her to get up. No witnessed seizure activity. No loss of bowel or bladder. She reports bp and blood sugar were OK when checked by EMS. Reports that they did an EKG as well which was normal.    She was advised to go to the ER by EMS but she declined.  She was given some water and "it seemed to help."   Denies CP/SOB. Has had no further episodes. She is continuing with medical weight management and has lost a considerable amount of weight.    Wt Readings from Last 3 Encounters:  10/10/21 185 lb (83.9 kg)  03/03/21 204 lb 6.4 oz (92.7 kg)  08/26/20 230 lb 3.2 oz (104.4 kg)     Health Maintenance Due  Topic Date Due   MAMMOGRAM  10/22/2020   COVID-19 Vaccine (5 - Pfizer risk series) 01/27/2021   INFLUENZA VACCINE  09/12/2021    Past Medical History:  Diagnosis Date   Alcoholism (HCC)    quit 09/23/1999   Allergy    Anemia    Anxiety    patient denies this dx   Bipolar 1 disorder (HCC)    Borderline personality disorder (HCC)    Depression    GERD (gastroesophageal reflux disease)    no current problems   Hyperlipidemia 08/19/2017   Hypothyroidism 06/08/2010   IBS (irritable bowel syndrome)    Preventative health care 08/26/2020   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, cancerous lesion removed    Past Surgical History:  Procedure Laterality Date   APPENDECTOMY     COLONOSCOPY  05/03/2009   Normal - [facility]   EYE SURGERY Left    moh's procedure left eyelid   FOOT SURGERY Right    HERNIA REPAIR     inguinal    SQUAMOUS CELL CARCINOMA EXCISION Left    Mohs procedure   UPPER GASTROINTESTINAL ENDOSCOPY  05/03/2009   [facility]   WISDOM TOOTH EXTRACTION      Family History  Problem Relation Age of Onset   Breast cancer Mother    Depression Mother    Prostate cancer Father    Heart disease Father    Alcohol abuse Father    Depression Father    Anxiety disorder Father    Cancer Maternal Grandfather        lung?   Depression Maternal Uncle    Alcohol abuse Maternal Uncle    Suicidality Maternal Uncle    Alcohol abuse Half-Sister    Bipolar disorder Half-Sister    Obesity Half-Sister    Diabetes type II Half-Sister    Colon cancer Neg Hx    Rectal cancer Neg Hx    Stomach cancer Neg Hx     Social History   Socioeconomic History   Marital status: Married    Spouse name: Not on file   Number of children: Not on file   Years of education: Not on file   Highest education level: Not on file    Occupational History   Occupation: Hospital doctor  Tobacco Use   Smoking status: Never   Smokeless tobacco: Never  Vaping Use   Vaping Use: Never used  Substance and Sexual Activity   Alcohol use: No   Drug use: No   Sexual activity: Yes    Birth control/protection: None    Comment: husband with vasectomy  Other Topics Concern   Not on file  Social History Narrative   Regular exercise-yes   Works as a Education officer, 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    Social Determinants of Health   Financial Resource Strain: Not on file  Food Insecurity: No Food Insecurity (11/29/2017)   Hunger Vital Sign    Worried About Running Out of Food in the Last Year: Never true    Ran Out of Food in the Last Year: Never true  Transportation Needs: Not on file  Physical Activity: Not on file  Stress: Not on file  Social Connections: Not on file  Intimate Partner Violence: Not on file    Outpatient Medications Prior to Visit  Medication Sig Dispense Refill   atorvastatin  (LIPITOR) 20 MG tablet Take 1 tablet (20 mg total) by mouth daily. 90 tablet 1   carbamazepine (EQUETRO) 200 MG CP12 12 hr capsule Take 2 capsules (400 mg total) by mouth every morning AND 3 capsules (600 mg total) at bedtime. 450 capsule 1   cephALEXin (KEFLEX) 500 MG capsule Take 1 capsule (500 mg total) by mouth 3 (three) times daily. 15 capsule 1   levothyroxine (SYNTHROID) 50 MCG tablet TAKE 1 TABLET BY MOUTH EVERY DAY BEFORE BREAKFAST 90 tablet 1   Lurasidone HCl (LATUDA) 120 MG TABS TAKE 1/2-1 TAB EVERY EVENING WITH A MEAL 90 tablet 0   Multiple Vitamin (MULTIVITAMIN) tablet Take 1 tablet by mouth daily.     valACYclovir (VALTREX) 1000 MG tablet Take 0.5 tablets (500 mg total) by mouth daily. 45 tablet 1   cephALEXin (KEFLEX) 500 MG capsule Take 1 capsule (500 mg total) by mouth 3 (three) times daily. 21 capsule 0   fluconazole (DIFLUCAN) 150 MG tablet Take 1 tablet by mouth once weekly for 3 weeks 3 tablet 0   ondansetron (ZOFRAN) 4 MG tablet Take 1 tablet (4 mg total) by mouth every 8 (eight) hours as needed for nausea or vomiting. 20 tablet 0   oxyCODONE-acetaminophen (PERCOCET) 10-325 MG tablet Take 1 tablet by mouth every 6 (six) hours as needed for pain. 20 tablet 0   psyllium (METAMUCIL) 58.6 % packet Take 1 packet by mouth daily as needed.     No facility-administered medications prior to visit.    No Known Allergies  Review of Systems  Cardiovascular:  Positive for syncope.   See HPI    Objective:    Physical Exam Constitutional:      General: She is not in acute distress.    Appearance: Normal appearance. She is well-developed.  HENT:     Head: Normocephalic and atraumatic.     Right Ear: External ear normal.     Left Ear: External ear normal.  Eyes:     General: No scleral icterus. Neck:     Thyroid: No thyromegaly.  Cardiovascular:     Rate and Rhythm: Normal rate and regular rhythm.     Heart sounds: Normal heart sounds. No murmur heard. Pulmonary:      Effort: Pulmonary effort is normal. No respiratory distress.     Breath sounds: Normal breath sounds.  No wheezing.  Musculoskeletal:     Cervical back: Neck supple.  Skin:    General: Skin is warm and dry.  Neurological:     Mental Status: She is alert and oriented to person, place, and time.  Psychiatric:        Mood and Affect: Mood normal.        Behavior: Behavior normal.        Thought Content: Thought content normal.        Judgment: Judgment normal.     BP 120/76 (BP Location: Right Arm, Patient Position: Sitting, Cuff Size: Small)   Pulse (!) 55   Temp 97.8 F (36.6 C) (Oral)   Resp 16   Wt 185 lb (83.9 kg)   LMP 11/29/2015   SpO2 100%   BMI 28.98 kg/m  Wt Readings from Last 3 Encounters:  10/10/21 185 lb (83.9 kg)  03/03/21 204 lb 6.4 oz (92.7 kg)  08/26/20 230 lb 3.2 oz (104.4 kg)       Assessment & Plan:   Problem List Items Addressed This Visit       Unprioritized   Syncope - Primary    It sounds like she was most likely dehydrated. Will check baseline labs as ordered. Discussed importance of adequate hydration.  She is advised not to drive if she has recurrent symptoms and to let us know if she has any future symptoms.       Relevant Orders   Comp Met (CMET)   CBC with Differential/Platelet   Hyperlipidemia    She is interested in the possibility of coming off of statin. Will check follow up lipid panel.       Relevant Orders   Lipid panel    I have discontinued Marvis Repress Yow's psyllium, fluconazole, oxyCODONE-acetaminophen, and ondansetron. I am also having her maintain her multivitamin, valACYclovir, atorvastatin, levothyroxine, carbamazepine, Lurasidone HCl, and cephALEXin.  No orders of the defined types were placed in this encounter.

## 2021-10-10 NOTE — Assessment & Plan Note (Signed)
It sounds like she was most likely dehydrated. Will check baseline labs as ordered. Discussed importance of adequate hydration.  She is advised not to drive if she has recurrent symptoms and to let us know if she has any future symptoms.

## 2021-10-10 NOTE — Assessment & Plan Note (Signed)
She is interested in the possibility of coming off of statin. Will check follow up lipid panel.

## 2021-10-11 ENCOUNTER — Telehealth: Payer: Self-pay | Admitting: Family

## 2021-10-11 DIAGNOSIS — E871 Hypo-osmolality and hyponatremia: Secondary | ICD-10-CM

## 2021-10-11 LAB — CBC WITH DIFFERENTIAL/PLATELET
Basophils Absolute: 0 10*3/uL (ref 0.0–0.1)
Basophils Relative: 1 % (ref 0.0–3.0)
Eosinophils Absolute: 0.1 10*3/uL (ref 0.0–0.7)
Eosinophils Relative: 2.5 % (ref 0.0–5.0)
HCT: 36.9 % (ref 36.0–46.0)
Hemoglobin: 12.6 g/dL (ref 12.0–15.0)
Lymphocytes Relative: 37.1 % (ref 12.0–46.0)
Lymphs Abs: 1.7 10*3/uL (ref 0.7–4.0)
MCHC: 34 g/dL (ref 30.0–36.0)
MCV: 99.8 fl (ref 78.0–100.0)
Monocytes Absolute: 0.6 10*3/uL (ref 0.1–1.0)
Monocytes Relative: 11.9 % (ref 3.0–12.0)
Neutro Abs: 2.2 10*3/uL (ref 1.4–7.7)
Neutrophils Relative %: 47.5 % (ref 43.0–77.0)
Platelets: 244 10*3/uL (ref 150.0–400.0)
RBC: 3.7 Mil/uL — ABNORMAL LOW (ref 3.87–5.11)
RDW: 12.3 % (ref 11.5–15.5)
WBC: 4.7 10*3/uL (ref 4.0–10.5)

## 2021-10-11 LAB — COMPREHENSIVE METABOLIC PANEL
ALT: 18 U/L (ref 0–35)
AST: 14 U/L (ref 0–37)
Albumin: 4.4 g/dL (ref 3.5–5.2)
Alkaline Phosphatase: 86 U/L (ref 39–117)
BUN: 16 mg/dL (ref 6–23)
CO2: 29 mEq/L (ref 19–32)
Calcium: 9.2 mg/dL (ref 8.4–10.5)
Chloride: 94 mEq/L — ABNORMAL LOW (ref 96–112)
Creatinine, Ser: 0.57 mg/dL (ref 0.40–1.20)
GFR: 102.14 mL/min (ref >60.00)
Glucose, Bld: 84 mg/dL (ref 70–99)
Potassium: 4 mEq/L (ref 3.5–5.1)
Sodium: 130 mEq/L — ABNORMAL LOW (ref 135–145)
Total Bilirubin: 0.4 mg/dL (ref 0.2–1.2)
Total Protein: 7.2 g/dL (ref 6.0–8.3)

## 2021-10-11 LAB — LIPID PANEL
Cholesterol: 181 mg/dL (ref 0–200)
HDL: 65.4 mg/dL (ref >39.00)
LDL Cholesterol: 98 mg/dL (ref 0–99)
NonHDL: 115.48
Total CHOL/HDL Ratio: 3
Triglycerides: 85 mg/dL (ref 0.0–149.0)
VLDL: 17 mg/dL (ref 0.0–40.0)

## 2021-10-11 NOTE — Telephone Encounter (Signed)
Sodium is a little low.  I would like her to liberalize sodium in her diet- ok to add to things.  Limit water to 64 oz a day. Repeat bmet in 2 weeks, dx hyponatremia.

## 2021-10-12 NOTE — Telephone Encounter (Signed)
Lvm for patient to call back

## 2021-10-13 ENCOUNTER — Other Ambulatory Visit: Payer: Self-pay | Admitting: Family

## 2021-10-13 NOTE — Addendum Note (Signed)
Addended by: Jiles Prows on: 10/13/2021 11:29 AM   Modules accepted: Orders

## 2021-10-18 ENCOUNTER — Encounter: Payer: Self-pay | Admitting: Podiatry

## 2021-10-18 ENCOUNTER — Ambulatory Visit: Payer: Managed Care, Other (non HMO) | Admitting: Podiatry

## 2021-10-18 ENCOUNTER — Ambulatory Visit: Payer: Managed Care, Other (non HMO)

## 2021-10-18 DIAGNOSIS — Z9889 Other specified postprocedural states: Secondary | ICD-10-CM

## 2021-10-19 NOTE — Progress Notes (Signed)
Subjective:   Patient ID: Diana Decker, female   DOB: 56 y.o.   MRN: [id number]   HPI Patient states she is improving stating that the pain has gotten better and the redness is gone   ROS      Objective:  Physical Exam  Neurovascular status intact with the left first MPJ healing well with minimal erythema noted no drainage noted wound edges healed well good alignment good range of motion     Assessment:  Doing well post osteotomy first metatarsal left with superficial infection resolved currently     Plan:  Advised on continued elevation compression and gradual increase in activity levels keeping a Band-Aid on the distal portion of the incision and if any redness or drainage were to occur inform us immediately.  Patient to be checked again approximate 4 weeks  X-rays indicate osteotomies healing well fixation in place no indications of pathology with good joint congruence

## 2021-10-26 ENCOUNTER — Other Ambulatory Visit (INDEPENDENT_AMBULATORY_CARE_PROVIDER_SITE_OTHER): Payer: Managed Care, Other (non HMO)

## 2021-10-26 DIAGNOSIS — E871 Hypo-osmolality and hyponatremia: Secondary | ICD-10-CM

## 2021-10-26 LAB — BASIC METABOLIC PANEL
BUN: 23 mg/dL (ref 6–23)
CO2: 31 mEq/L (ref 19–32)
Calcium: 9.5 mg/dL (ref 8.4–10.5)
Chloride: 103 mEq/L (ref 96–112)
Creatinine, Ser: 0.71 mg/dL (ref 0.40–1.20)
GFR: 95.53 mL/min (ref >60.00)
Glucose, Bld: 79 mg/dL (ref 70–99)
Potassium: 4.3 mEq/L (ref 3.5–5.1)
Sodium: 140 mEq/L (ref 135–145)

## 2021-11-02 ENCOUNTER — Other Ambulatory Visit: Payer: Self-pay | Admitting: Family

## 2021-11-16 ENCOUNTER — Ambulatory Visit (INDEPENDENT_AMBULATORY_CARE_PROVIDER_SITE_OTHER): Payer: Managed Care, Other (non HMO) | Admitting: Psychiatry

## 2021-11-16 ENCOUNTER — Encounter: Payer: Self-pay | Admitting: Psychiatry

## 2021-11-16 DIAGNOSIS — F3177 Bipolar disorder, in partial remission, most recent episode mixed: Secondary | ICD-10-CM

## 2021-11-16 MED ORDER — CARBAMAZEPINE ER 200 MG PO CP12: ORAL_CAPSULE | ORAL | 1 refills | Status: DC

## 2021-11-16 MED ORDER — LURASIDONE HCL 60 MG PO TABS: 60.0000 mg | ORAL_TABLET | Freq: Every day | ORAL | 1 refills | Status: DC

## 2021-11-16 NOTE — Progress Notes (Signed)
Diana Decker [id number] Jul 12, 1965 56 y.o.  Subjective:   Patient ID:  Diana Decker is a 56 y.o. (DOB 12-07-1965) female.  Chief Complaint:  Chief Complaint  Patient presents with   Follow-up    Bipolar disorder, anxiety    HPI Diana Decker presents to the office today for follow-up of mood disturbance and anxiety.   She has lost 55 lbs intentionally with weight loss program. Going to the gym 4-5 times a week "and I love it." She sees a Physiological scientist twice a week. She reports "I think it has helped me mentally and physically" with regular exercise. She reports that she "hasn't developed the healthy eating habits I thought I would." She has been eating mostly shakes and bars. She reports that she tends to eat different foods and more food while traveling.   She reports some anxiety about the future with finances, medical benefits, etc.   Denies depressed mood. She reports that she had some increased spending when she lost weight and this has started to improve. Had hyponatremia in late August and attributes this to change in diet plan. Sodium has returned to normal. Energy and motivation have been improving. Concentration has been adequate. Denies SI.   Went to [facility] over the weekend and returned late Monday. Husband retired and has been Marketing executive and watching TV.  She continues to take care of her mother.   Continues to see therapist every other Saturday.   Past Psychiatric Medication Trials: Latuda- Had restless legs on higher doses.  Good response to 60 mg dose Equetro-effective for mood stabilization without visual changes Carbamazepine (generic)-had vision changes and drowsiness Abilify-effective.  Experience compulsive eating and spending.  Weight gain. Saphris Depakote-some weight gain Lithium-took briefly Serzone Trazodone Lamictal-caused insomnia Melatonin    AIMS    Flowsheet Row Office Visit from 11/16/2021 in [place] [facility]  Visit from 01/19/2021 in [place] Office Visit from 09/12/2018 in [facility] Total Score 0 0 0      PHQ2-9    [place] Office Visit from 07/01/2017 in [facility] [place] Visit from 05/29/2016 in [place] [facility] [place] [facility]  PHQ-2 Total Score 0 4  PHQ-9 Total Score 2 13        Review of Systems:  Review of Systems  Musculoskeletal:  Negative for gait problem.       Recovering from foot surgery  Neurological:  Negative for tremors.  Psychiatric/Behavioral:         Please refer to HPI    Medications: I have reviewed the patient's current medications.  Current Outpatient Medications  Medication Sig Dispense Refill   atorvastatin (LIPITOR) 20 MG tablet TAKE 1 TABLET BY MOUTH EVERY DAY 90 tablet 1   levothyroxine (SYNTHROID) 50 MCG tablet Take 1 tablet (50 mcg total) by mouth daily before breakfast. 90 tablet 1   Lurasidone HCl 60 MG TABS Take 1 tablet (60 mg total) by mouth daily with supper. 90 tablet 1   Multiple Vitamin (MULTIVITAMIN) tablet Take 1 tablet by mouth daily.     valACYclovir (VALTREX) 1000 MG tablet Take 0.5 tablets (500 mg total) by mouth daily. 45 tablet 1   carbamazepine (EQUETRO) 200 MG CP12 12 hr capsule Take 2 capsules (400 mg total) by mouth every morning AND 3 capsules (600 mg total) at bedtime. 450 capsule 1   cephALEXin (KEFLEX) 500 MG capsule Take 1 capsule (500  mg total) by mouth 3 (three) times daily. 15 capsule 1   No current facility-administered medications for this visit.    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   Please see review of systems for further details on the patient's review from today.   Objective:   Physical Exam:  Wt 179 lb (81.2 kg)   LMP 11/29/2015   BMI 28.04 kg/m   Physical Exam Constitutional:      General: She is not in acute distress. Musculoskeletal:        General: No deformity.  Neurological:     Mental Status: She is alert and oriented to person, place, and time.     Coordination: Coordination normal.  Psychiatric:        Attention and Perception: Attention and perception normal. She does not perceive auditory or visual hallucinations.        Mood and Affect: Mood normal. Mood is not anxious or depressed. Affect is not labile, blunt, angry or inappropriate.        Speech: Speech normal.        Behavior: Behavior normal.        Thought Content: Thought content normal. Thought content is not paranoid or delusional. Thought content does not include homicidal or suicidal ideation. Thought content does not include homicidal or suicidal plan.        Cognition and Memory: Cognition and memory normal.        Judgment: Judgment normal.     Comments: Insight intact     Lab Review:     Component Value Date/Time   NA 140 10/26/2021 0803   K 4.3 10/26/2021 0803   CL 103 10/26/2021 0803   CO2 31 10/26/2021 0803   GLUCOSE 79 10/26/2021 0803   BUN 23 10/26/2021 0803   CREATININE 0.71 10/26/2021 0803   CREATININE 0.64 05/02/2021 1501   CALCIUM 9.5 10/26/2021 0803   PROT 7.2 10/10/2021 1333   ALBUMIN 4.4 10/10/2021 1333   AST 14 10/10/2021 1333   ALT 18 10/10/2021 1333   ALKPHOS 86 10/10/2021 1333   BILITOT 0.4 10/10/2021 1333   GFRNONAA >90 07/11/2013  1511   GFRAA >90 07/11/2013 1511       Component Value Date/Time   WBC 4.7 10/10/2021 1333   RBC 3.70 (L) 10/10/2021 1333   HGB 12.6 10/10/2021 1333   HCT 36.9 10/10/2021 1333   PLT 244.0 10/10/2021 1333   MCV 99.8 10/10/2021 1333   MCH 32.6 05/02/2021 1501   MCHC 34.0 10/10/2021 1333   RDW 12.3 10/10/2021 1333   LYMPHSABS 1.7 10/10/2021 1333   MONOABS 0.6 10/10/2021 1333   EOSABS 0.1 10/10/2021 1333   BASOSABS 0.0 10/10/2021 1333    No results found for: "POCLITH", "LITHIUM"   Lab Results  Component  Value Date   CBMZ 6.7 05/02/2021     .res Assessment: Plan:    Pt seen for 25 minutes and time spent discussing recent symptoms and episode of hyponatremia. Discussed that hyponatremia does not appear to be related to medications since sodium levels returned to normal limits following change in nutritional supplements.  Continue Equetro 400 mg in the morning and 600 mg at bedtime for mood stabilization.  Continue Latuda 60 mg daily with supper for mood stabilization.  Recommend continuing psychotherapy.  Pt to follow-up in 6 months or sooner if clinically indicated.  Patient advised to contact office with any questions, adverse effects, or acute worsening in signs and symptoms.   Breauna was seen today for follow-up.  Diagnoses and all orders for this visit:  Bipolar disorder, in partial remission, most recent episode mixed (HCC) -     Lurasidone HCl 60 MG TABS; Take 1 tablet (60 mg total) by mouth daily with supper. -     carbamazepine (EQUETRO) 200 MG CP12 12 hr capsule; Take 2 capsules (400 mg total) by mouth every morning AND 3 capsules (600 mg total) at bedtime.     Please see After Visit Summary for patient specific instructions.  Future Appointments  Date Time Provider [place]  11/22/2021  8:15 AM Wallene Huh, DPM [facility]  01/10/2022  8:00 AM [name], NP [facility]  05/17/2022  8:30 AM Thayer Headings, PMHNP [facility] None     No orders of the defined types were placed in this encounter.   -------------------------------

## 2021-11-22 ENCOUNTER — Ambulatory Visit (INDEPENDENT_AMBULATORY_CARE_PROVIDER_SITE_OTHER): Payer: Managed Care, Other (non HMO) | Admitting: Podiatry

## 2021-11-22 ENCOUNTER — Ambulatory Visit: Payer: Managed Care, Other (non HMO)

## 2021-11-22 ENCOUNTER — Encounter: Payer: Self-pay | Admitting: Podiatry

## 2021-11-22 DIAGNOSIS — Z9889 Other specified postprocedural states: Secondary | ICD-10-CM | POA: Diagnosis not present

## 2021-11-22 NOTE — Progress Notes (Signed)
Subjective:   Patient ID: Diana Decker, female   DOB: 56 y.o.   MRN: [id number]   HPI Patient presents stating that she is doing well with surgery still having some pain but overall improved neurovascular   ROS      Objective:  Physical Exam  Status intact negative Bevelyn Buckles' sign noted left foot healing well wound edges well coapted good range of motion no crepitus of the joint mild swelling consistent with this.  Postop     Assessment:  Doing well post osteotomy first metatarsal left foot     Plan:  H&P x-ray reviewed discussed importance of range of motion exercises patient's discharge will be seen back as needed  X-rays indicate osteotomies healing well joint looks congruent with fixation in place

## 2021-12-18 IMAGING — US US THYROID
1 series · 13 of 25 positions shown · non-contrast
Comparison: None.

CLINICAL DATA: Hypothyroidism, thyromegaly 07/15/2017

EXAM:
THYROID ULTRASOUND
TECHNIQUE: Ultrasound examination of the thyroid gland and adjacent soft
tissues was performed.

[Series 1: us thyroid · 0.09mm/px · 13 of 38 slices shown]
[im 1/38]
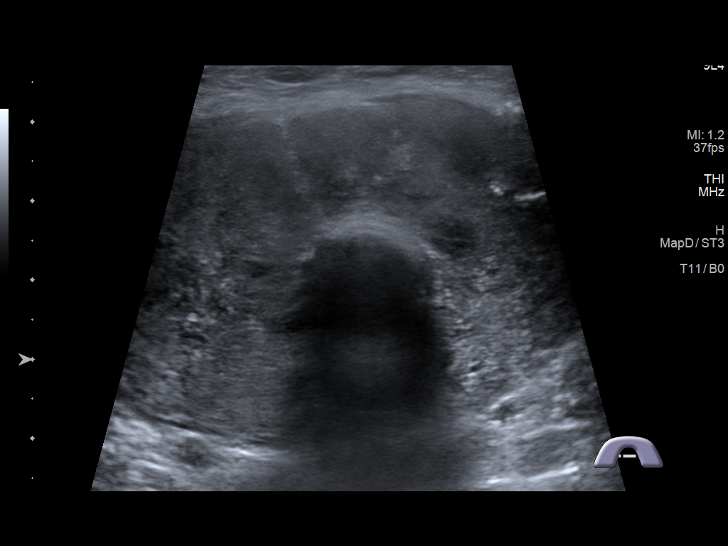
[im 4/38]
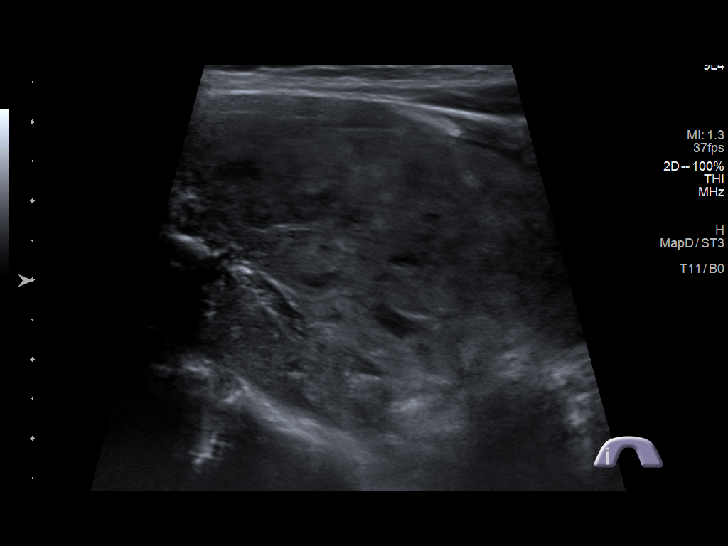
[im 7/38]
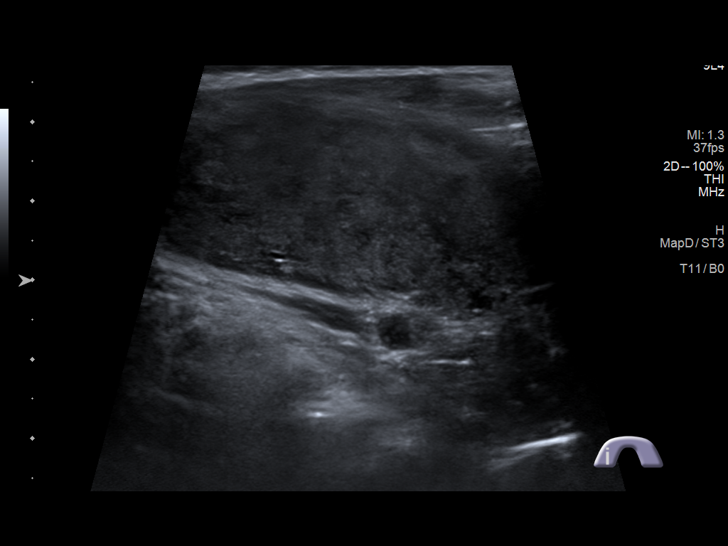
[im 10/38]
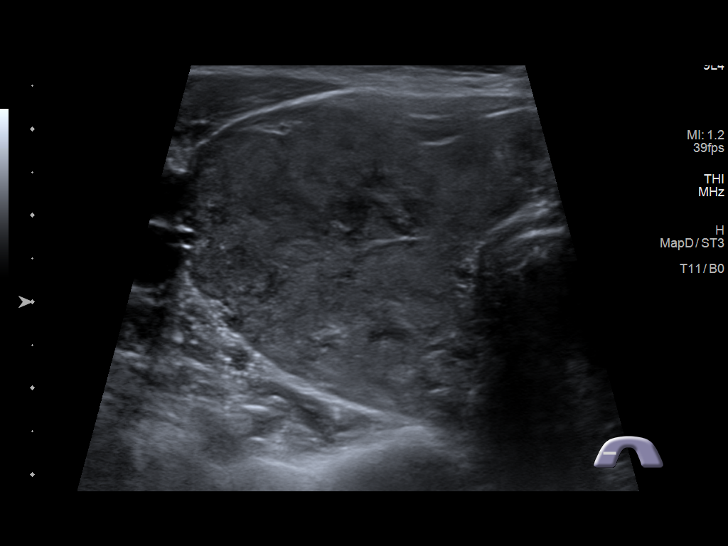
[im 13/38]
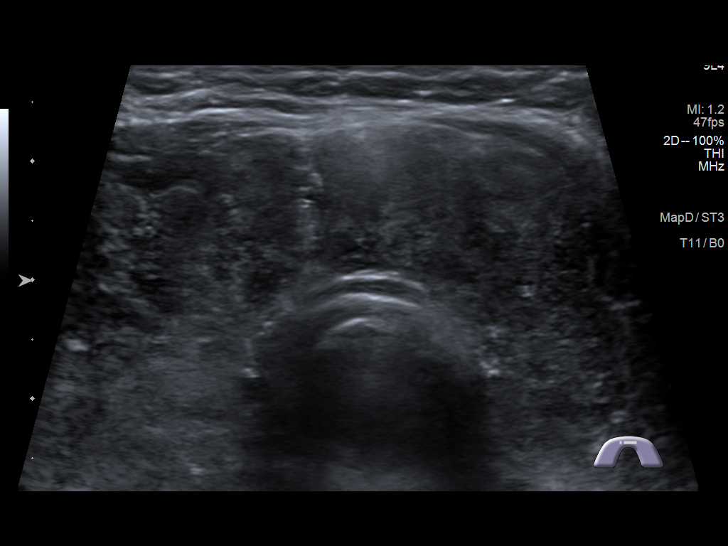
[im 16/38]
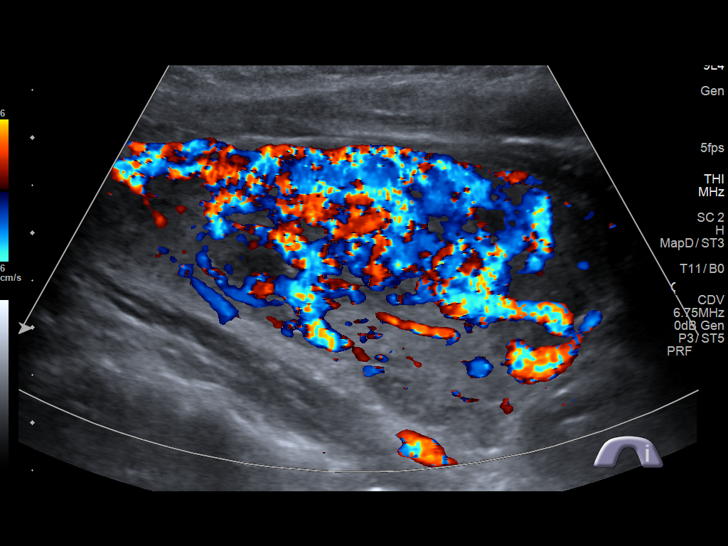
[im 19/38]
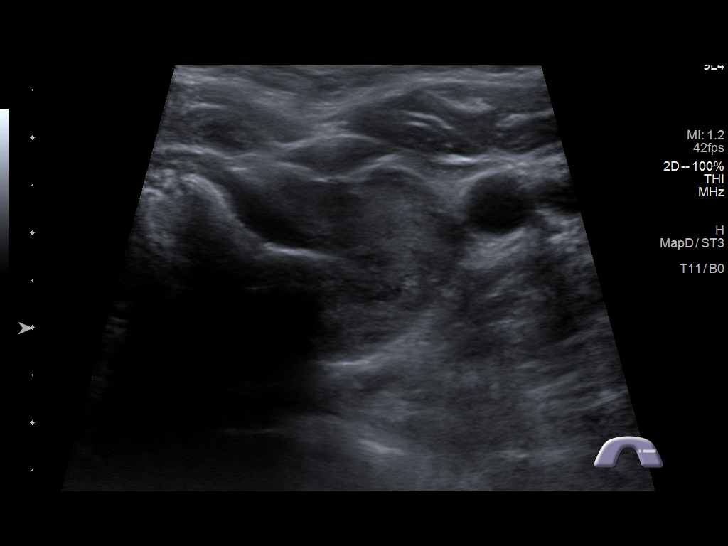
[im 22/38]
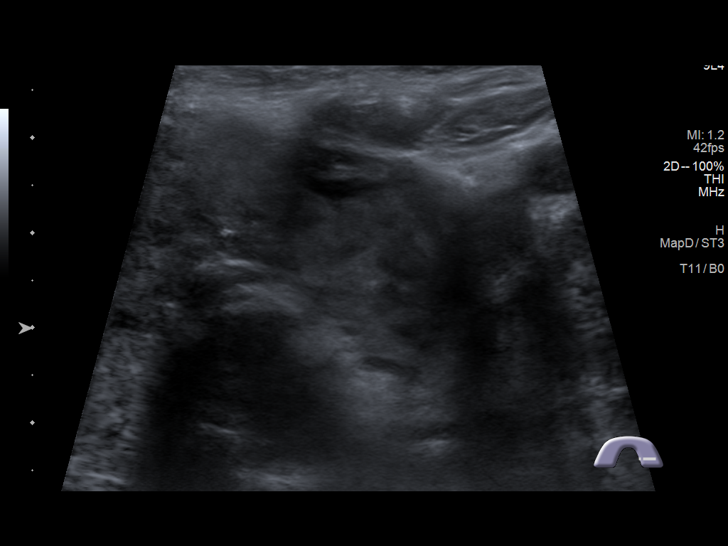
[im 25/38]
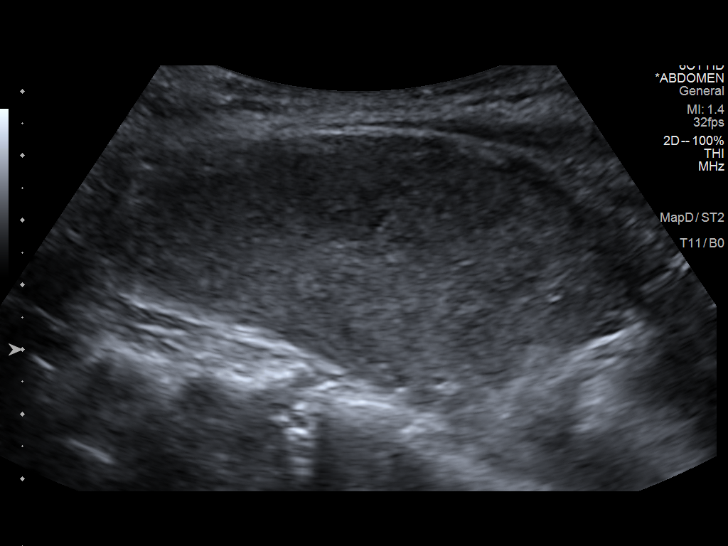
[im 28/38]
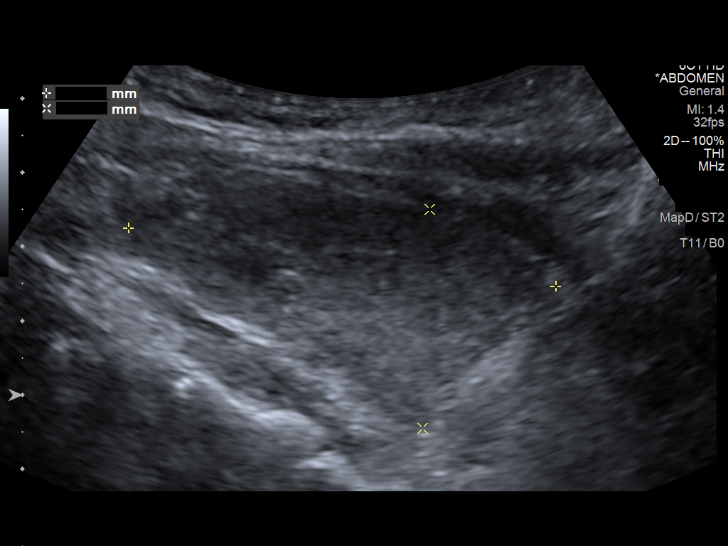
[im 31/38]
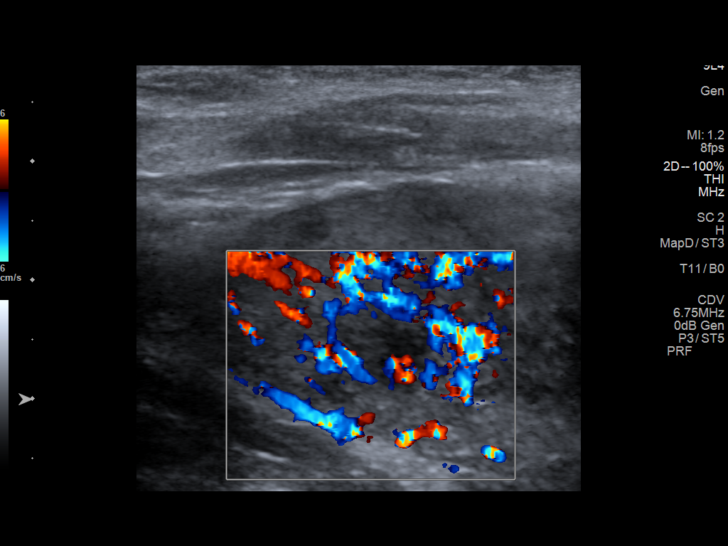
[im 34/38]
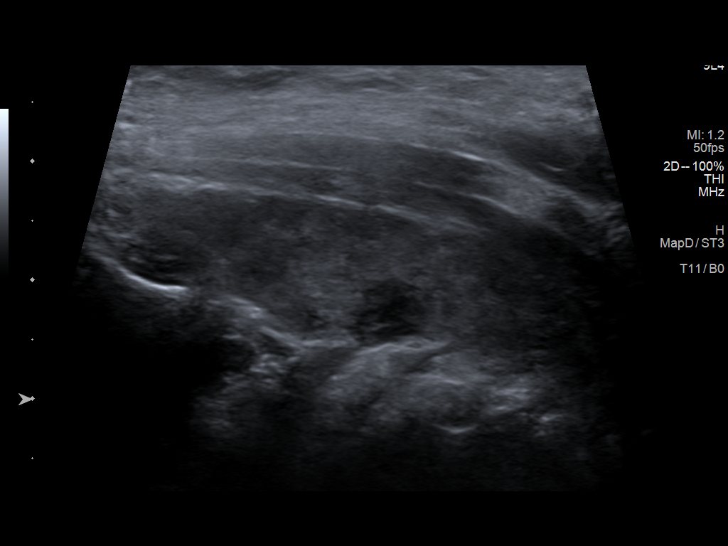
[im 38/38]
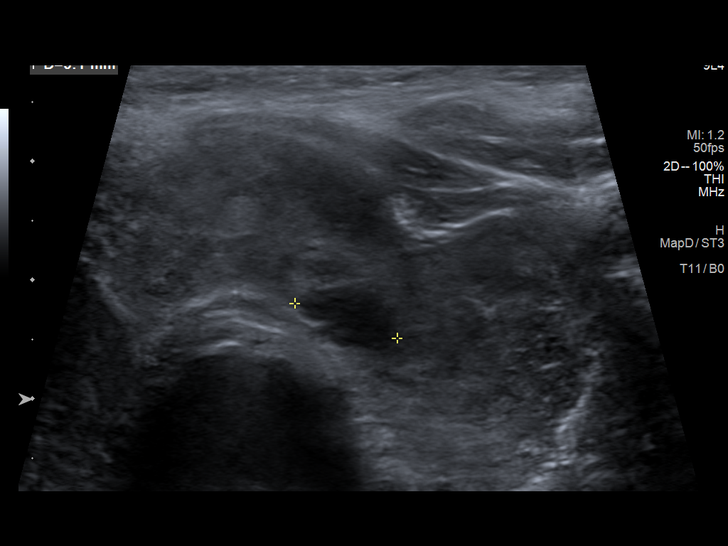

[13 of 25 positions shown; findings below may reference images not displayed]

FINDINGS: Parenchymal Echotexture: Markedly heterogenous

Isthmus: 1.3 cm

Right lobe: 8.1 x 4.2 x 3.3 cm

Left lobe: 5.8 x 3.0 x 2.3 cm

_________________________________________________________

Estimated total number of nodules >/= 1 cm: 0

Number of spongiform nodules >/=  2 cm not described below (TR1): 0

Number of mixed cystic and solid nodules >/= 1.5 cm not described
below (TR2): 0

_________________________________________________________

Marked thyroid heterogeneity. Thyroid gland is also hypervascular
and mildly enlarged.

Left mid thyroid subcentimeter hypoechoic nodules noted, all
measuring 9 mm or less in size. These would not meet criteria for
any biopsy or follow-up and are not fully described by TI rads
criteria. No bulky adenopathy.
IMPRESSION: Marked thyroid heterogeneity and enlargement. Diffuse
hypervascularity. Findings compatible with medical thyroid disease
including thyroiditis.

No significant nodule that warrants follow-up or biopsy.

The above is in keeping with the ACR TI-RADS recommendations - [HOSPITAL] 5150;[DATE].

## 2022-01-10 ENCOUNTER — Encounter: Payer: Self-pay | Admitting: Family

## 2022-01-10 ENCOUNTER — Ambulatory Visit (INDEPENDENT_AMBULATORY_CARE_PROVIDER_SITE_OTHER): Payer: Managed Care, Other (non HMO) | Admitting: Family

## 2022-01-10 VITALS — BP 116/63 | HR 61 | Temp 97.9°F | Resp 16 | Ht 67.0 in | Wt 188.0 lb

## 2022-01-10 DIAGNOSIS — E663 Overweight: Secondary | ICD-10-CM | POA: Diagnosis not present

## 2022-01-10 DIAGNOSIS — A6000 Herpesviral infection of urogenital system, unspecified: Secondary | ICD-10-CM

## 2022-01-10 DIAGNOSIS — E871 Hypo-osmolality and hyponatremia: Secondary | ICD-10-CM

## 2022-01-10 DIAGNOSIS — E039 Hypothyroidism, unspecified: Secondary | ICD-10-CM

## 2022-01-10 LAB — BASIC METABOLIC PANEL
BUN: 28 mg/dL — ABNORMAL HIGH (ref 6–23)
CO2: 31 mEq/L (ref 19–32)
Calcium: 9.3 mg/dL (ref 8.4–10.5)
Chloride: 103 mEq/L (ref 96–112)
Creatinine, Ser: 0.55 mg/dL (ref 0.40–1.20)
GFR: 102.84 mL/min (ref >60.00)
Glucose, Bld: 81 mg/dL (ref 70–99)
Potassium: 4.4 mEq/L (ref 3.5–5.1)
Sodium: 139 mEq/L (ref 135–145)

## 2022-01-10 LAB — TSH: TSH: 2.42 u[IU]/mL (ref 0.35–5.50)

## 2022-01-10 NOTE — Progress Notes (Signed)
Subjective:     Patient ID: Diana Decker, female    DOB: 03-25-1965, 56 y.o.   MRN: [id number]  Chief Complaint  Patient presents with   Hypothyroidism    Here for follow up   Hyperlipidemia    Here for follow up    Hyperlipidemia   Patient is in today for follow up.  Hypothyroid-  Lab Results  Component Value Date   TSH 2.44 03/03/2021   Maintained on synthroid 50 mcg.    Bipolar 1 Disorder- following with psychiatry.  Reports mood overall has been stable.  Younger brother diagnosed with terminal cancer, niece is struggling with breast cancer, older sister (they have a terrible relationship) and she is having a lot work stress.   Genital herpes- maintained on daily Valtrex. No outbreaks.   She is exercising regularly continuing to work on a high protein diet.    Wt Readings from Last 3 Encounters:  01/10/22 188 lb (85.3 kg)  10/10/21 185 lb (83.9 kg)  03/03/21 204 lb 6.4 oz (92.7 kg)       Health Maintenance Due  Topic Date Due   MAMMOGRAM  10/22/2020   COVID-19 Vaccine (5 - 2023-24 season) 10/13/2021    Past Medical History:  Diagnosis Date   Alcoholism ([place])    quit 09/23/1999   Allergy    Anemia    Anxiety    patient denies this dx   Bipolar 1 disorder ([place])    Borderline personality disorder ([place])    Depression    GERD (gastroesophageal reflux disease)    no current problems   Hyperlipidemia 08/19/2017   Hypothyroidism 06/08/2010   IBS (irritable bowel syndrome)    Preventative health care 08/26/2020   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, cancerous lesion removed    Past Surgical History:  Procedure Laterality Date   APPENDECTOMY     COLONOSCOPY  05/03/2009   Normal - [facility]   EYE SURGERY Left    moh's procedure left eyelid   FOOT SURGERY Right    HERNIA REPAIR     inguinal   SQUAMOUS CELL CARCINOMA EXCISION Left    Mohs procedure   UPPER GASTROINTESTINAL ENDOSCOPY  05/03/2009   [facility]   WISDOM TOOTH EXTRACTION       Family History  Problem Relation Age of Onset   Breast cancer Mother    Depression Mother    Prostate cancer Father    Heart disease Father    Alcohol abuse Father    Depression Father    Anxiety disorder Father    Pancreatic cancer Brother    Cancer Maternal Grandfather        lung?   Depression Maternal Uncle    Alcohol abuse Maternal Uncle    Suicidality Maternal Uncle    Alcohol abuse Half-Sister    Bipolar disorder Half-Sister    Obesity Half-Sister    Diabetes type II Half-Sister    Breast cancer Niece    Colon cancer Neg Hx    Rectal cancer Neg Hx    Stomach cancer Neg Hx     Social History   Socioeconomic History   Marital status: Married    Spouse name: Not on file   Number of children: Not on file   Years of education: Not on file   Highest education level: Not on file  Occupational History   Occupation: Hospital doctor  Tobacco Use   Smoking status: Never   Smokeless tobacco: Never  Vaping Use   Vaping Use: Never used  Substance and Sexual Activity   Alcohol use: No   Drug use: No   Sexual activity: Yes    Birth control/protection: None    Comment: husband with vasectomy  Other Topics Concern   Not on file  Social History Narrative   Regular exercise-yes   Works as a Education officer, 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    Social Determinants of Health   Financial Resource Strain: Not on file  Food Insecurity: No Food Insecurity (11/29/2017)   Hunger Vital Sign    Worried About Running Out of Food in the Last Year: Never true    Ran Out of Food in the Last Year: Never true  Transportation Needs: Not on file  Physical Activity: Not on file  Stress: Not on file  Social Connections: Not on file  Intimate Partner Violence: Not on file    Outpatient Medications Prior to Visit  Medication Sig Dispense Refill   atorvastatin (LIPITOR) 20 MG tablet TAKE 1 TABLET BY MOUTH EVERY DAY 90 tablet 1   carbamazepine (EQUETRO)  200 MG CP12 12 hr capsule Take 2 capsules (400 mg total) by mouth every morning AND 3 capsules (600 mg total) at bedtime. 450 capsule 1   levothyroxine (SYNTHROID) 50 MCG tablet Take 1 tablet (50 mcg total) by mouth daily before breakfast. 90 tablet 1   Lurasidone HCl 60 MG TABS Take 1 tablet (60 mg total) by mouth daily with supper. 90 tablet 1   Multiple Vitamin (MULTIVITAMIN) tablet Take 1 tablet by mouth daily.     valACYclovir (VALTREX) 1000 MG tablet Take 0.5 tablets (500 mg total) by mouth daily. 45 tablet 1   cephALEXin (KEFLEX) 500 MG capsule Take 1 capsule (500 mg total) by mouth 3 (three) times daily. 15 capsule 1   No facility-administered medications prior to visit.    No Known Allergies  ROS    See HPI Objective:    Physical Exam Constitutional:      General: She is not in acute distress.    Appearance: Normal appearance. She is well-developed.  HENT:     Head: Normocephalic and atraumatic.     Right Ear: External ear normal.     Left Ear: External ear normal.  Eyes:     General: No scleral icterus. Neck:     Thyroid: No thyromegaly.  Cardiovascular:     Rate and Rhythm: Normal rate and regular rhythm.     Heart sounds: Normal heart sounds. No murmur heard. Pulmonary:     Effort: Pulmonary effort is normal. No respiratory distress.     Breath sounds: Normal breath sounds. No wheezing.  Musculoskeletal:     Cervical back: Neck supple.  Skin:    General: Skin is warm and dry.  Neurological:     Mental Status: She is alert and oriented to person, place, and time.  Psychiatric:        Mood and Affect: Mood normal.        Behavior: Behavior normal.        Thought Content: Thought content normal.        Judgment: Judgment normal.     BP 116/63 (BP Location: Right Arm, Patient Position: Sitting, Cuff Size: Small)   Pulse 61   Temp 97.9 F (36.6 C) (Oral)   Resp 16   Ht '5\' 7"'$  (1.702 m)   Wt 188 lb (85.3 kg)   LMP 11/29/2015   SpO2  98%   BMI 29.44 kg/m   Wt Readings from Last 3 Encounters:  01/10/22 188 lb (85.3 kg)  10/10/21 185 lb (83.9 kg)  03/03/21 204 lb 6.4 oz (92.7 kg)       Assessment & Plan:   Problem List Items Addressed This Visit       Unprioritized   Hypothyroidism - Primary (Chronic)    Clinically stable on synthroid, continue same.       Relevant Orders   TSH   Overweight (BMI 25.0-29.9)    Continue work on [employer], exercise, weight loss. She is really enjoying Spin class.       Genital herpes    Stable on daily valtrex.  Continue same.       Other Visit Diagnoses     Hyponatremia       Relevant Orders   Basic metabolic panel       I have discontinued Marvis Repress Yow's cephALEXin. I am also having her maintain her multivitamin, valACYclovir, levothyroxine, atorvastatin, Lurasidone HCl, and carbamazepine.  No orders of the defined types were placed in this encounter.

## 2022-01-10 NOTE — Assessment & Plan Note (Signed)
Continue work on [employer], exercise, weight loss. She is really enjoying Spin class.

## 2022-01-10 NOTE — Assessment & Plan Note (Signed)
Stable on daily valtrex.  Continue same.

## 2022-01-10 NOTE — Assessment & Plan Note (Signed)
Clinically stable on synthroid, continue same.

## 2022-01-24 ENCOUNTER — Other Ambulatory Visit: Payer: Self-pay | Admitting: Family

## 2022-02-02 LAB — HM MAMMOGRAPHY

## 2022-02-13 ENCOUNTER — Telehealth: Payer: Self-pay | Admitting: Family

## 2022-02-13 NOTE — Telephone Encounter (Signed)
Please call Dr. Allyn Kenner and request copy of mammogram.

## 2022-02-13 NOTE — Telephone Encounter (Signed)
Records request faxed 

## 2022-02-14 ENCOUNTER — Encounter: Payer: Self-pay | Admitting: Family

## 2022-02-14 ENCOUNTER — Ambulatory Visit (INDEPENDENT_AMBULATORY_CARE_PROVIDER_SITE_OTHER): Payer: Managed Care, Other (non HMO) | Admitting: Family

## 2022-02-14 VITALS — BP 106/67 | HR 66 | Temp 97.9°F | Resp 16 | Wt 187.0 lb

## 2022-02-14 DIAGNOSIS — B349 Viral infection, unspecified: Secondary | ICD-10-CM

## 2022-02-14 NOTE — Progress Notes (Signed)
Subjective:   By signing my name below, I, Diana Decker, attest that this documentation has been prepared under the direction and in the presence of Diana Alar, NP. 02/14/2022   Patient ID: Diana Decker, female    DOB: 1965-04-07, 57 y.o.   MRN: [id number]  Chief Complaint  Patient presents with   Cough    Complains of cough, negative flu, covid and strep yesterday at an walk in clinic   Nasal Congestion    Complains of nasal congestion    Cough   Patient is in today for a office visit.   She complains of cough and nausea yesterday. She also has mild diarrhea. Her mother and sister had coughing symptoms prior to her developing symptoms. She seen an urgent care yesterday and vomited while in the office. She was given Zofran for nausea and tessalon Perles for the cough. Since then her nausea improved but her cough continues. She is starting to eat more since her nausea has decreased. She tested negative for Covid-19, flu, and strept throat.    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, cancerous lesion removed    Past Surgical History:  Procedure Laterality Date   APPENDECTOMY     COLONOSCOPY  05/03/2009   Normal - [facility]   EYE SURGERY Left    moh's procedure left eyelid   FOOT SURGERY Right    HERNIA REPAIR     inguinal   SQUAMOUS CELL CARCINOMA EXCISION Left    Mohs procedure   UPPER GASTROINTESTINAL ENDOSCOPY  05/03/2009   [facility]   WISDOM TOOTH EXTRACTION      Family History  Problem Relation Age of Onset   Breast cancer Mother     Depression Mother    Prostate cancer Father    Heart disease Father    Alcohol abuse Father    Depression Father    Anxiety disorder Father    Pancreatic cancer Brother    Cancer Maternal Grandfather        lung?   Depression Maternal Uncle    Alcohol abuse Maternal Uncle    Suicidality Maternal Uncle    Alcohol abuse Half-Sister    Bipolar disorder Half-Sister    Obesity Half-Sister    Diabetes type II Half-Sister    Breast cancer Niece    Colon cancer Neg Hx    Rectal cancer Neg Hx    Stomach cancer Neg Hx     Social History   Socioeconomic History   Marital status: Married    Spouse name: Not on file   Number of children: Not on file   Years of education: Not on file   Highest education level: Not on file  Occupational History   Occupation: Hospital doctor  Tobacco Use   Smoking status: Never   Smokeless tobacco: Never  Vaping Use   Vaping Use: Never used  Substance and Sexual Activity   Alcohol use: No   Drug use: No   Sexual activity:  Yes    Birth control/protection: None    Comment: husband with vasectomy  Other Topics Concern   Not on file  Social History Narrative   Regular exercise-yes   Works as a Education officer, 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    Social Determinants of Health   Financial Resource Strain: Not on file  Food Insecurity: No Food Insecurity (11/29/2017)   Hunger Vital Sign    Worried About Running Out of Food in the Last Year: Never true    Ran Out of Food in the Last Year: Never true  Transportation Needs: Not on file  Physical Activity: Not on file  Stress: Not on file  Social Connections: Not on file  Intimate Partner Violence: Not on file    Outpatient Medications Prior to Visit  Medication Sig Dispense Refill   atorvastatin (LIPITOR) 20 MG tablet TAKE 1 TABLET BY MOUTH EVERY DAY 90 tablet 1   carbamazepine (EQUETRO) 200 MG CP12 12 hr capsule Take 2 capsules (400 mg total) by mouth every morning AND  3 capsules (600 mg total) at bedtime. 450 capsule 1   levothyroxine (SYNTHROID) 50 MCG tablet Take 1 tablet (50 mcg total) by mouth daily before breakfast. 90 tablet 1   Lurasidone HCl 60 MG TABS Take 1 tablet (60 mg total) by mouth daily with supper. 90 tablet 1   Multiple Vitamin (MULTIVITAMIN) tablet Take 1 tablet by mouth daily.     valACYclovir (VALTREX) 1000 MG tablet TAKE 1/2 TABLET BY MOUTH DAILY 45 tablet 1   No facility-administered medications prior to visit.    No Known Allergies  Review of Systems  Respiratory:  Positive for cough.        Objective:    Physical Exam Constitutional:      General: She is not in acute distress.    Appearance: Normal appearance. She is not ill-appearing.  HENT:     Head: Normocephalic and atraumatic.     Right Ear: External ear normal.     Left Ear: External ear normal.  Eyes:     Extraocular Movements: Extraocular movements intact.     Pupils: Pupils are equal, round, and reactive to light.  Cardiovascular:     Rate and Rhythm: Normal rate and regular rhythm.     Heart sounds: Normal heart sounds. No murmur heard.    No gallop.  Pulmonary:     Effort: Pulmonary effort is normal. No respiratory distress.     Breath sounds: Normal breath sounds. No wheezing or rales.  Abdominal:     General: There is no distension.     Palpations: Abdomen is soft.     Tenderness: There is no abdominal tenderness. There is no guarding.  Skin:    General: Skin is warm and dry.  Neurological:     Mental Status: She is alert and oriented to person, place, and time.  Psychiatric:        Judgment: Judgment normal.     BP 106/67 (BP Location: Right Arm, Patient Position: Sitting, Cuff Size: Small)   Pulse 66   Temp 97.9 F (36.6 C) (Oral)   Resp 16   Wt 187 lb (84.8 kg)   LMP 11/29/2015   SpO2 98%   BMI 29.29 kg/m  Wt Readings from Last 3 Encounters:  02/14/22 187 lb (84.8 kg)  01/10/22 188 lb (85.3 kg)  10/10/21 185 lb (83.9 kg)        Assessment & Plan:  Viral illness Assessment &  Plan: Nausea and vomiting have resolved. Still having some cough and congestion which is typical since she is on day #2 of illness. Continue tessalon prn cough, encouraged fluids, tylenol prn, rest. Call if symptoms worsen or if not improved in 4-5 days. Not provided for work.      I, Nance Pear, NP, personally preformed the services described in this documentation.  All medical record entries made by the scribe were at my direction and in my presence.  I have reviewed the chart and discharge instructions (if applicable) and agree that the record reflects my personal performance and is accurate and complete. 02/14/2022   I,Diana Decker,acting as a Education administrator for Nance Pear, NP.,have documented all relevant documentation on the behalf of Nance Pear, NP,as directed by  Nance Pear, NP while in the presence of Nance Pear, NP.   Nance Pear, NP

## 2022-02-14 NOTE — Assessment & Plan Note (Signed)
Nausea and vomiting have resolved. Still having some cough and congestion which is typical since she is on day #2 of illness. Continue tessalon prn cough, encouraged fluids, tylenol prn, rest. Call if symptoms worsen or if not improved in 4-5 days. Not provided for work.

## 2022-02-15 ENCOUNTER — Encounter: Payer: Self-pay | Admitting: Family

## 2022-02-15 ENCOUNTER — Telehealth: Payer: Managed Care, Other (non HMO) | Admitting: Family Medicine

## 2022-02-15 DIAGNOSIS — R051 Acute cough: Secondary | ICD-10-CM

## 2022-02-15 MED ORDER — PSEUDOEPH-BROMPHEN-DM 30-2-10 MG/5ML PO SYRP: 5.0000 mL | ORAL_SOLUTION | Freq: Four times a day (QID) | ORAL | 0 refills | Status: DC | PRN

## 2022-02-15 MED ORDER — PROMETHAZINE-DM 6.25-15 MG/5ML PO SYRP: 5.0000 mL | ORAL_SOLUTION | Freq: Four times a day (QID) | ORAL | 0 refills | Status: DC | PRN

## 2022-02-15 NOTE — Progress Notes (Signed)
[place]   Has followed up with PCP

## 2022-02-15 NOTE — Addendum Note (Signed)
Addended by: [name] on: 02/15/2022 03:59 PM   Modules accepted: Orders

## 2022-02-16 LAB — HM PAP SMEAR
HM Pap smear: NEGATIVE
HPV, high-risk: NEGATIVE

## 2022-02-19 ENCOUNTER — Telehealth: Payer: Self-pay | Admitting: Family

## 2022-02-19 NOTE — Telephone Encounter (Signed)
Patient called to see if she could get a refill on promethazine-dextromethorphan (PROMETHAZINE-DM) 6.25-15 MG/5ML syrup  She still has a terrible cough and cannot sleep because of it.

## 2022-02-20 ENCOUNTER — Ambulatory Visit (HOSPITAL_BASED_OUTPATIENT_CLINIC_OR_DEPARTMENT_OTHER)
Admission: RE | Admit: 2022-02-20 | Discharge: 2022-02-20 | Disposition: A | Discharge status: Home / Self Care | Payer: Managed Care, Other (non HMO) | Source: Ambulatory Visit | Attending: Family | Admitting: Family

## 2022-02-20 ENCOUNTER — Ambulatory Visit: Payer: Managed Care, Other (non HMO) | Admitting: Family

## 2022-02-20 VITALS — BP 121/68 | HR 66 | Temp 98.3°F | Resp 16 | Wt 193.0 lb

## 2022-02-20 DIAGNOSIS — J40 Bronchitis, not specified as acute or chronic: Secondary | ICD-10-CM | POA: Diagnosis not present

## 2022-02-20 MED ORDER — PREDNISONE 10 MG PO TABS: ORAL_TABLET | ORAL | 0 refills | Status: DC

## 2022-02-20 MED ORDER — PROMETHAZINE-DM 6.25-15 MG/5ML PO SYRP: 5.0000 mL | ORAL_SOLUTION | Freq: Four times a day (QID) | ORAL | 0 refills | Status: DC | PRN

## 2022-02-20 MED ORDER — AZITHROMYCIN 250 MG PO TABS: ORAL_TABLET | ORAL | 0 refills | Status: AC

## 2022-02-20 NOTE — Addendum Note (Signed)
Addended by: [name] on: 02/20/2022 09:33 AM   Modules accepted: Orders

## 2022-02-20 NOTE — Assessment & Plan Note (Signed)
Symptoms are worsening. Will obtain CXR to exclude pneumonia. Begin empiric zpak and prednisone taper.  Promethazine DM cough syrup prn.  Call if symptoms do not improve in 3-4 days.

## 2022-02-20 NOTE — Progress Notes (Signed)
Subjective:   By signing my name below, I, Diana Decker, attest that this documentation has been prepared under the direction and in the presence of Diana Alar, NP. 02/20/2022   Patient ID: Diana Decker, female    DOB: [birth year], 57 y.o.   MRN: [id number]  Chief Complaint  Patient presents with   Cough    Complains of persistent cough, hard to sleep due to cough   Nasal Congestion    Still has nasal congestion    Cough Pertinent negatives include no wheezing.   Patient is in today for a office visit.  We last saw her on 1/3. She was having mild cough and congestion at that time and she was on day 2 of her illness at that time.  Today she reports that her cough has worsened since that time.   Her cough worsens at night. She had a fever on Thursday 02/15/2021, otherwise she's had no other fevers. She continues taking promethazine DM at night and finds mild relief. She denies having any wheezing.    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, cancerous lesion removed    Past Surgical History:  Procedure Laterality Date   APPENDECTOMY     COLONOSCOPY  05/03/2009   Normal - [facility]   EYE SURGERY Left    moh's procedure left eyelid   FOOT SURGERY Right    HERNIA REPAIR     inguinal   SQUAMOUS CELL CARCINOMA EXCISION Left    Mohs procedure   UPPER GASTROINTESTINAL ENDOSCOPY  05/03/2009   [facility]   WISDOM TOOTH EXTRACTION      Family History  Problem Relation Age of Onset   Breast cancer Mother    Depression Mother    Prostate cancer Father     Heart disease Father    Alcohol abuse Father    Depression Father    Anxiety disorder Father    Pancreatic cancer Brother    Cancer Maternal Grandfather        lung?   Depression Maternal Uncle    Alcohol abuse Maternal Uncle    Suicidality Maternal Uncle    Alcohol abuse Half-Sister    Bipolar disorder Half-Sister    Obesity Half-Sister    Diabetes type II Half-Sister    Breast cancer Niece    Colon cancer Neg Hx    Rectal cancer Neg Hx    Stomach cancer Neg Hx     Social History   Socioeconomic History   Marital status: Married    Spouse name: Not on file   Number of children: Not on file   Years of education: Not on file   Highest education level: Not on file  Occupational History   Occupation: Hospital doctor  Tobacco Use   Smoking status: Never   Smokeless tobacco: Never  Vaping Use   Vaping Use: Never used  Substance and Sexual Activity   Alcohol use: No   Drug use: No   Sexual activity: Yes  Birth control/protection: None    Comment: husband with vasectomy  Other Topics Concern   Not on file  Social History Narrative   Regular exercise-yes   Works as a Education officer, 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    Social Determinants of Health   Financial Resource Strain: Not on file  Food Insecurity: No Food Insecurity (11/29/2017)   Hunger Vital Sign    Worried About Running Out of Food in the Last Year: Never true    Ran Out of Food in the Last Year: Never true  Transportation Needs: Not on file  Physical Activity: Not on file  Stress: Not on file  Social Connections: Not on file  Intimate Partner Violence: Not on file    Outpatient Medications Prior to Visit  Medication Sig Dispense Refill   atorvastatin (LIPITOR) 20 MG tablet TAKE 1 TABLET BY MOUTH EVERY DAY 90 tablet 1   levothyroxine (SYNTHROID) 50 MCG tablet Take 1 tablet (50 mcg total) by mouth daily before breakfast. 90 tablet 1   Lurasidone HCl 60 MG TABS Take 1 tablet  (60 mg total) by mouth daily with supper. 90 tablet 1   Multiple Vitamin (MULTIVITAMIN) tablet Take 1 tablet by mouth daily.     promethazine-dextromethorphan (PROMETHAZINE-DM) 6.25-15 MG/5ML syrup Take 5 mLs by mouth 4 (four) times daily as needed for cough. 120 mL 0   valACYclovir (VALTREX) 1000 MG tablet TAKE 1/2 TABLET BY MOUTH DAILY 45 tablet 1   carbamazepine (EQUETRO) 200 MG CP12 12 hr capsule Take 2 capsules (400 mg total) by mouth every morning AND 3 capsules (600 mg total) at bedtime. 450 capsule 1   No facility-administered medications prior to visit.    No Known Allergies  Review of Systems  Respiratory:  Positive for cough. Negative for wheezing.        Objective:    Physical Exam Constitutional:      General: She is not in acute distress.    Appearance: Normal appearance. She is not ill-appearing.  HENT:     Head: Normocephalic and atraumatic.     Right Ear: External ear normal.     Left Ear: External ear normal.  Eyes:     Extraocular Movements: Extraocular movements intact.     Pupils: Pupils are equal, round, and reactive to light.  Cardiovascular:     Rate and Rhythm: Normal rate and regular rhythm.     Heart sounds: Normal heart sounds. No murmur heard.    No gallop.  Pulmonary:     Effort: Pulmonary effort is normal. No respiratory distress.     Breath sounds: Normal breath sounds. No wheezing or rales.  Skin:    General: Skin is warm and dry.  Neurological:     Mental Status: She is alert and oriented to person, place, and time.  Psychiatric:        Judgment: Judgment normal.     BP 121/68 (BP Location: Right Arm, Patient Position: Sitting, Cuff Size: Small)   Pulse 66   Temp 98.3 F (36.8 C) (Oral)   Resp 16   Wt 193 lb (87.5 kg)   LMP 11/29/2015   SpO2 100%   BMI 30.23 kg/m  Wt Readings from Last 3 Encounters:  02/20/22 193 lb (87.5 kg)  02/14/22 187 lb (84.8 kg)  01/10/22 188 lb (85.3 kg)       Assessment & Plan:   Bronchitis Assessment & Plan: Symptoms are worsening. Will obtain CXR to exclude pneumonia. Begin  empiric zpak and prednisone taper.  Promethazine DM cough syrup prn.  Call if symptoms do not improve in 3-4 days.   Orders: -     DG Chest 2 View; Future  Other orders -     Azithromycin; Take 2 tablets on day 1, then 1 tablet daily on days 2 through 5  Dispense: 6 tablet; Refill: 0 -     predniSONE; 4 tabs by mouth once daily for 2 days, then 3 tabs daily x 2 days, then 2 tabs daily x 2 days, then 1 tab daily x 2 days  Dispense: 20 tablet; Refill: 0    I, Nance Pear, NP, personally preformed the services described in this documentation.  All medical record entries made by the scribe were at my direction and in my presence.  I have reviewed the chart and discharge instructions (if applicable) and agree that the record reflects my personal performance and is accurate and complete. 02/20/2022   I,Diana Decker,acting as a Education administrator for Nance Pear, NP.,have documented all relevant documentation on the behalf of Nance Pear, NP,as directed by  Nance Pear, NP while in the presence of Nance Pear, NP.   Nance Pear, NP

## 2022-02-20 NOTE — Telephone Encounter (Signed)
I sent rx for promethazine DM.  However if her symptoms have not improved I would recommend that she schedule a follow up visit please.

## 2022-02-21 NOTE — Telephone Encounter (Signed)
Patient was seen yesterday.

## 2022-03-09 ENCOUNTER — Ambulatory Visit (INDEPENDENT_AMBULATORY_CARE_PROVIDER_SITE_OTHER): Payer: Managed Care, Other (non HMO) | Admitting: Family Medicine

## 2022-03-09 ENCOUNTER — Encounter: Payer: Self-pay | Admitting: Family Medicine

## 2022-03-09 ENCOUNTER — Ambulatory Visit: Payer: Managed Care, Other (non HMO) | Admitting: Family Medicine

## 2022-03-09 VITALS — BP 105/96 | HR 64 | Temp 97.7°F | Resp 16 | Ht 67.0 in | Wt 189.6 lb

## 2022-03-09 DIAGNOSIS — J40 Bronchitis, not specified as acute or chronic: Secondary | ICD-10-CM

## 2022-03-09 MED ORDER — ALBUTEROL SULFATE HFA 108 (90 BASE) MCG/ACT IN AERS: 2.0000 | INHALATION_SPRAY | Freq: Four times a day (QID) | RESPIRATORY_TRACT | 0 refills | Status: DC | PRN

## 2022-03-09 MED ORDER — HYDROCOD POLI-CHLORPHE POLI ER 10-8 MG/5ML PO SUER: 5.0000 mL | Freq: Two times a day (BID) | ORAL | 0 refills | Status: AC | PRN

## 2022-03-09 MED ORDER — AMOXICILLIN-POT CLAVULANATE 875-125 MG PO TABS: 1.0000 | ORAL_TABLET | Freq: Two times a day (BID) | ORAL | 0 refills | Status: AC

## 2022-03-09 NOTE — Patient Instructions (Signed)
Possible double sickening so let's try Augmentin. Adding Tussionex (this will make you drowsy). Adding as needed albuterol inhaler for coughing fits, shortness of breath. Continue supportive measures including rest, hydration, humidifier use, steam showers, warm compresses to sinuses, warm liquids with lemon and honey, and over-the-counter cough, cold, and analgesics as needed.  Consider taking Claritin during the day to help with any drainage. Try to avoid throat clearing. Sip on warm/cool liquids to keep the throat settled and prevent coughing fits.

## 2022-03-09 NOTE — Progress Notes (Signed)
Acute Office Visit  Subjective:     Patient ID: Diana Decker, female    DOB: [birth year], 57 y.o.   MRN: [id number]  Chief Complaint  Patient presents with   Cough     Patient is in today for persistent cough.  Patient last saw PCP on 02/20/22 for cough since 02/12/22. She had reported running a fever once at that time. She was started on a Zpak, prednisone taper (she had previously tried promethazine DM with no improvement). CXR at that visit revealed no active cardiopulmonary disease.  Patient states she was finally starting to notice some improvement until this worsened this week. Cough has become more productive (but not able to clear anything out), fatgiue, rhinorrhea, occasional left ear discomfort and chest soreness. She has not noticed any headaches/sinus pressure, chest pain, dyspnea, wheezing, hemoptysis, GI/GU symptoms. Several of her coworkers have been sick recently.       All review of systems negative except what is listed in the HPI      Objective:    BP (!) 105/96   Pulse 64   Temp 97.7 F (36.5 C)   Resp 16   Ht '5\' 7"'$  (1.702 m)   Wt 189 lb 9.6 oz (86 kg)   LMP 11/29/2015   SpO2 96%   BMI 29.70 kg/m    Physical Exam Vitals reviewed.  Constitutional:      Appearance: Normal appearance.  HENT:     Head: Normocephalic and atraumatic.     Right Ear: Tympanic membrane normal.     Left Ear: Tympanic membrane normal.  Cardiovascular:     Rate and Rhythm: Normal rate and regular rhythm.     Pulses: Normal pulses.     Heart sounds: Normal heart sounds.  Pulmonary:     Effort: Pulmonary effort is normal.     Breath sounds: Normal breath sounds. No wheezing, rhonchi or rales.  Musculoskeletal:     Cervical back: Normal range of motion and neck supple. No tenderness.  Lymphadenopathy:     Cervical: No cervical adenopathy.  Skin:    General: Skin is warm and dry.  Neurological:     Mental Status: She is alert and oriented to person, place, and time.   Psychiatric:        Mood and Affect: Mood normal.        Behavior: Behavior normal.        Thought Content: Thought content normal.        Judgment: Judgment normal.     No results found for any visits on 03/09/22.      Assessment & Plan:   Problem List Items Addressed This Visit       Respiratory   Bronchitis - Primary Possible double sickening so let's try Augmentin. Adding Tussionex (this will make you drowsy). Adding as needed albuterol inhaler for coughing fits, shortness of breath. Continue supportive measures including rest, hydration, humidifier use, steam showers, warm compresses to sinuses, warm liquids with lemon and honey, and over-the-counter cough, cold, and analgesics as needed.  Consider taking Claritin during the day to help with any drainage. Try to avoid throat clearing. Sip on warm/cool liquids to keep the throat settled and prevent coughing fits.      Relevant Medications   chlorpheniramine-HYDROcodone (TUSSIONEX) 10-8 MG/5ML   amoxicillin-clavulanate (AUGMENTIN) 875-125 MG tablet   albuterol (VENTOLIN HFA) 108 (90 Base) MCG/ACT inhaler    Meds ordered this encounter  Medications   chlorpheniramine-HYDROcodone (TUSSIONEX) 10-8 MG/5ML  Sig: Take 5 mLs by mouth every 12 (twelve) hours as needed for up to 5 days.    Dispense:  50 mL    Refill:  0    Order Specific Question:   Supervising Provider    Answer:   Penni Homans A [[id number]]   amoxicillin-clavulanate (AUGMENTIN) 875-125 MG tablet    Sig: Take 1 tablet by mouth 2 (two) times daily for 7 days.    Dispense:  14 tablet    Refill:  0    Order Specific Question:   Supervising Provider    Answer:   Penni Homans A [[id number]]   albuterol (VENTOLIN HFA) 108 (90 Base) MCG/ACT inhaler    Sig: Inhale 2 puffs into the lungs every 6 (six) hours as needed for wheezing or shortness of breath.    Dispense:  8 g    Refill:  0    Order Specific Question:   Supervising Provider    Answer:   Penni Homans A  [[id number]]    Return if symptoms worsen or fail to improve.  Terrilyn Saver, NP

## 2022-03-15 ENCOUNTER — Telehealth: Payer: Self-pay

## 2022-03-15 NOTE — Telephone Encounter (Signed)
Lvm to call back to see if she still needed the tussionex that we sent in for pt.

## 2022-03-23 ENCOUNTER — Other Ambulatory Visit: Payer: Self-pay | Admitting: Family

## 2022-03-31 ENCOUNTER — Other Ambulatory Visit: Payer: Self-pay | Admitting: Family Medicine

## 2022-03-31 DIAGNOSIS — J40 Bronchitis, not specified as acute or chronic: Secondary | ICD-10-CM

## 2022-04-25 ENCOUNTER — Other Ambulatory Visit: Payer: Self-pay | Admitting: Psychiatry

## 2022-04-25 DIAGNOSIS — F3177 Bipolar disorder, in partial remission, most recent episode mixed: Secondary | ICD-10-CM

## 2022-05-17 ENCOUNTER — Encounter: Payer: Self-pay | Admitting: Psychiatry

## 2022-05-17 ENCOUNTER — Ambulatory Visit (INDEPENDENT_AMBULATORY_CARE_PROVIDER_SITE_OTHER): Payer: 59 | Admitting: Psychiatry

## 2022-05-17 VITALS — Wt 186.0 lb

## 2022-05-17 DIAGNOSIS — F99 Mental disorder, not otherwise specified: Secondary | ICD-10-CM | POA: Diagnosis not present

## 2022-05-17 DIAGNOSIS — F5105 Insomnia due to other mental disorder: Secondary | ICD-10-CM | POA: Diagnosis not present

## 2022-05-17 DIAGNOSIS — F3177 Bipolar disorder, in partial remission, most recent episode mixed: Secondary | ICD-10-CM | POA: Diagnosis not present

## 2022-05-17 MED ORDER — LURASIDONE HCL 40 MG PO TABS: 40.0000 mg | ORAL_TABLET | Freq: Every day | ORAL | 1 refills | Status: DC

## 2022-05-17 MED ORDER — EQUETRO 200 MG PO CP12: ORAL_CAPSULE | ORAL | 0 refills | Status: DC

## 2022-05-17 NOTE — Progress Notes (Signed)
Diana Decker [id number] August 26, 1965 57 y.o.  Subjective:   Patient ID:  Diana Decker is a 57 y.o. (DOB 1965-11-19) female.  Chief Complaint:  Chief Complaint  Patient presents with   Follow-up    Bipolar Disorder, anxiety    HPI Diana Decker presents to the office today for follow-up of Bipolar Disorder and anxiety. She reports that she is concerned about involuntary "twitches around my mouth" that started and have gradually worsened over the last 1-2 months. She reports that both she and her husband have noticed the involuntary movements. She denies tremor or any other involuntary movements.   She reports, "My mood is ok considering everything going on." She reports multiple stressors to include family members with health issues and receiving a promotion at work. She reports that she has had some increase in anxiety with being in a brand new role at work. Denies panic. She reports that she may have had some depression when she learned about her brother having terminal cancer. Denies manic symptoms other than a "shopping splurge" with multiple stressors and preparing for new job. Started having night sweats and sleep disturbance and started estrogen patch. She reports that sleep has improved slightly. Energy and motivation have been ok. She reports that her weight loss has "stalled out." Appetite has been "too much." She reports that she has been binge eating some. Concentration is "pretty good, a little distracted by everything going on." Denies SI.  She is now the Interior and spatial designer at [employer]. Younger brother has terminal pancreatic cancer with multiple mets. Sister's husband had been staying at her mother's house and there have been some issues with this. Older brother has "growth's in his brain." She reports that relationship with her husband is going well.   Past Psychiatric Medication Trials: Latuda- Had restless legs on higher doses.  Good response to 60 mg  dose Equetro-effective for mood stabilization without visual changes Carbamazepine (generic)-had vision changes and drowsiness Abilify-effective.  Experience compulsive eating and spending.  Weight gain. Saphris Depakote-some weight gain Lithium-took briefly Serzone Trazodone Lamictal-caused insomnia Melatonin    AIMS    Flowsheet Row Office Visit from 05/17/2022 in [place] Office Visit from 11/16/2021 in [facility] Office Visit from 01/19/2021 in [place] Office Visit from 09/12/2018 in [place] Total Score 4 0 0 0      PHQ2-9    [place] Office Visit from 01/10/2022 in [facility] [place] Visit from 07/01/2017 in [facility] at [place] Visit from 05/29/2016 in [facility] at [facility]  PHQ-2 Total Score 0 0 4  PHQ-9 Total Score -- 2 13        Review of Systems:  Review of Systems  Musculoskeletal:  Negative for gait problem.  Neurological:  Negative for tremors.  Psychiatric/Behavioral:         Please refer to HPI    Had bronchitis and a stomach virus in January and later had COVID.   Medications: I have reviewed the patient's current medications.  Current Outpatient Medications  Medication Sig Dispense Refill   atorvastatin (LIPITOR) 20 MG tablet TAKE 1 TABLET BY MOUTH EVERY DAY 90 tablet 1   COMBIPATCH 0.05-0.14 MG/DAY Place 1 patch onto the skin 2 (two) times a week.     levothyroxine (SYNTHROID) 50 MCG tablet TAKE  1 TABLET BY MOUTH DAILY BEFORE BREAKFAST 90 tablet 1   lurasidone (LATUDA) 40 MG TABS tablet Take 1 tablet (40 mg total) by mouth daily with supper. 30 tablet 1   Multiple Vitamin (MULTIVITAMIN) tablet Take 1 tablet by mouth daily.     valACYclovir (VALTREX) 1000 MG tablet TAKE 1/2 TABLET BY MOUTH DAILY 45  tablet 1   EQUETRO 200 MG CP12 12 hr capsule TAKE 2 CAPSULES (400 MG TOTAL) BY MOUTH EVERY MORNING AND 3 CAPSULES (600 MG TOTAL) AT BEDTIME. 450 capsule 0   No current facility-administered medications for this visit.    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   Please see review of systems for further details on the patient's review from today.   Objective:   Physical Exam:  Wt 186 lb (84.4 kg)   LMP 11/29/2015   BMI 29.13 kg/m   Physical Exam Constitutional:      General: She is not in acute distress. Musculoskeletal:        General: No deformity.  Neurological:     Mental Status: She is alert and oriented to person, place, and time.     Coordination: Coordination normal.  Psychiatric:        Attention and Perception: Attention and perception normal. She does not perceive auditory or visual hallucinations.        Mood and Affect: Mood normal. Mood is not anxious or depressed. Affect is not labile, blunt, angry or inappropriate.        Speech: Speech normal.        Behavior: Behavior normal.        Thought Content: Thought content normal. Thought content is not paranoid or delusional. Thought content does not include homicidal or suicidal ideation. Thought content does not include homicidal or suicidal plan.        Cognition and Memory:  Cognition and memory normal.        Judgment: Judgment normal.     Comments: Insight intact Mood is appropriate to content. Affect is congruent.      Lab Review:     Component Value Date/Time   NA 139 01/10/2022 0830   K 4.4 01/10/2022 0830   CL 103 01/10/2022 0830   CO2 31 01/10/2022 0830   GLUCOSE 81 01/10/2022 0830   BUN 28 (H) 01/10/2022 0830   CREATININE 0.55 01/10/2022 0830   CREATININE 0.64 05/02/2021 1501   CALCIUM 9.3 01/10/2022 0830   PROT 7.2 10/10/2021 1333   ALBUMIN 4.4 10/10/2021 1333   AST 14 10/10/2021 1333   ALT 18 10/10/2021 1333   ALKPHOS 86 10/10/2021 1333   BILITOT 0.4 10/10/2021 1333   GFRNONAA >90 07/11/2013 1511   GFRAA >90 07/11/2013 1511       Component Value Date/Time   WBC  4.7 10/10/2021 1333   RBC 3.70 (L) 10/10/2021 1333   HGB 12.6 10/10/2021 1333   HCT 36.9 10/10/2021 1333   PLT 244.0 10/10/2021 1333   MCV 99.8 10/10/2021 1333   MCH 32.6 05/02/2021 1501   MCHC 34.0 10/10/2021 1333   RDW 12.3 10/10/2021 1333   LYMPHSABS 1.7 10/10/2021 1333   MONOABS 0.6 10/10/2021 1333   EOSABS 0.1 10/10/2021 1333   BASOSABS 0.0 10/10/2021 1333    No results found for: "POCLITH", "LITHIUM"   Lab Results  Component Value Date   CBMZ 6.7 05/02/2021     .res Assessment: Plan:    Pt seen for 30 minutes and time spent discussing her concerns about involuntary orofacial movements and treatment options, to include dose reduction to possibly improve TD. Discussed that there are also medications to treat TD, to include Ingrezza and Austedo. She reports that she would like to attempt dose reduction of Latuda. Advised pt to contact office if she experiences any worsening mood symptoms. Discussed that TD may not immediately improve after dose decrease and could potentially worsen for a brief period of time before improving.  Will continue Equetro 400 mg in the morning and 600 mg po QHS for mood stabilization.  Pt to follow-up with this provider in 6-8 weeks  or sooner if clinically indicated.  Patient advised to contact office with any questions, adverse effects, or acute worsening in signs and symptoms.   Savannah was seen today for follow-up.  Diagnoses and all orders for this visit:  Bipolar disorder, in partial remission, most recent episode mixed -     lurasidone (LATUDA) 40 MG TABS tablet; Take 1 tablet (40 mg total) by mouth daily with supper. -     EQUETRO 200 MG CP12 12 hr capsule; TAKE 2 CAPSULES (400 MG TOTAL) BY MOUTH EVERY MORNING AND 3 CAPSULES (600 MG TOTAL) AT BEDTIME.  Insomnia due to other mental disorder     Please see After Visit Summary for patient specific instructions.  Future Appointments  Date Time Provider [place]  07/05/2022  4:00 PM Thayer Headings, PMHNP [facility] None  07/11/2022  8:20 AM [name], NP [facility]    No orders of the defined types were placed in this encounter.   -------------------------------

## 2022-05-23 ENCOUNTER — Telehealth: Payer: Self-pay | Admitting: Psychiatry

## 2022-05-23 NOTE — Telephone Encounter (Signed)
Agree with their order of recommendations, starting with Topamax. Topamax may potentially help with her anxiety and help stabilize mood. Phentermine could possibly increase anxiety and irritability or precipitate mania; however, risk is greater for those side effects with Vvyanse compared to Phentermine. These medications would not interact with current meds and lower efficacy. Recommend monitoring mood and anxiety closely with starting one of the new meds and stopping the medication or contacting provider if mood changes.

## 2022-05-23 NOTE — Telephone Encounter (Signed)
Patient is being seen by Weight Management. Insurance will not cover GLP-1 medications. The [facility] physician is recommending topiramate, phentermine, Qsymia, or Vyvanse as an alternative. They want to be sure that these medications would not be contraindicated with psych meds. The medications are listed in the order that they were recommended.

## 2022-05-23 NOTE — Telephone Encounter (Signed)
Diana Decker called at 10:15 because her Dr. Recommended some medication for her but he her wanted to check with you first.  There are 4 different options she wants to discuss.  Please call.  Next appt 5/23.  If you call before 5pm call her at work [phone]

## 2022-05-24 NOTE — Telephone Encounter (Signed)
Recommendations were provided to patient. She asked who prescribed the medication. I told her that she was taking the medication for other than psych issues, although medications could be beneficial for diagnoses/sx, and that she should have the Weight Management provider prescribe them, because she would be following her for response.

## 2022-06-04 ENCOUNTER — Other Ambulatory Visit: Payer: Self-pay | Admitting: Psychiatry

## 2022-06-04 DIAGNOSIS — F3177 Bipolar disorder, in partial remission, most recent episode mixed: Secondary | ICD-10-CM

## 2022-07-05 ENCOUNTER — Ambulatory Visit (INDEPENDENT_AMBULATORY_CARE_PROVIDER_SITE_OTHER): Payer: 59 | Admitting: Psychiatry

## 2022-07-05 ENCOUNTER — Encounter: Payer: Self-pay | Admitting: Psychiatry

## 2022-07-05 DIAGNOSIS — F5105 Insomnia due to other mental disorder: Secondary | ICD-10-CM

## 2022-07-05 DIAGNOSIS — F99 Mental disorder, not otherwise specified: Secondary | ICD-10-CM | POA: Diagnosis not present

## 2022-07-05 DIAGNOSIS — F3177 Bipolar disorder, in partial remission, most recent episode mixed: Secondary | ICD-10-CM

## 2022-07-05 MED ORDER — LURASIDONE HCL 40 MG PO TABS: 40.0000 mg | ORAL_TABLET | Freq: Every day | ORAL | 1 refills | Status: DC

## 2022-07-05 NOTE — Progress Notes (Signed)
Marjo Bietz [id number] 1965-05-14 57 y.o.  Subjective:   Patient ID:  Diana Decker is a 57 y.o. (DOB Jan 25, 1966) female.  Chief Complaint:  Chief Complaint  Patient presents with   Sleeping Problem   Medication Problem    TD    HPI Nashali Fix presents to the office today for follow-up of Bipolar Disorder and anxiety.   She reports that she is "tired." Sleep has been disrupted due to perimenopause. She has started a patch for for perimenopause and "it has helped some, but not as much as I had hoped."   She reports that she has been working long hours and not sure if this could be an indication of mania. She reports, "I'm struggling with food, bad." Denies excessive binging. She reports that she is eating impulsively, ie. Eating a piece of cake when she is trying to lose weight. She reports that Topamax has been helpful for food cravings and notices cravings when it wears off. Denies any other impulsive behaviors. Denies excessive irritability. She reports that she has been getting along well with coworkers. Denies elevated mood. Denies elevated energy or increased goal-directed activity. Denies excessive talkativeness. She reports "maybe a little" depression. She reports, "I do have a pretty significant amount of anxiety" with new job and increased responsibility. Some rumination. Denies panic. Concentration has been ok. She reports that she has had thoughts that she would "like to walk away from things." Denies SI.   Promoted to [employer] April 1st and replacement did not start until 06/28/22. 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   She reports less orofacial twitching since decreasing Latuda. She continues to notice some orofacial movements.   Past Psychiatric Medication Trials: Latuda- Had restless legs on higher doses.  Good response to 60 mg dose Equetro-effective for mood stabilization without visual changes Carbamazepine (generic)-had  vision changes and drowsiness Abilify-effective.  Experience compulsive eating and spending.  Weight gain. Saphris Depakote-some weight gain Lithium-took briefly Serzone Trazodone Lamictal-caused insomnia Melatonin  AIMS    Flowsheet Row Office Visit from 07/05/2022 in [facility] Office Visit from 05/17/2022 in [facility] Office Visit from 11/16/2021 in [facility] Office Visit from 01/19/2021 in [facility] Office Visit from 09/12/2018 in [facility]  AIMS Total Score 3 4 0 0 0      PHQ2-9    Flowsheet Row Office Visit from 01/10/2022 in [facility] Office Visit from 07/01/2017 in [facility] Office Visit from 05/29/2016 in [facility] at [facility]  PHQ-2 Total Score 0 0 4  PHQ-9 Total Score -- 2 13        Review of Systems:  Review of Systems  Musculoskeletal:  Negative for gait problem.  Neurological:  Negative for tremors.  Psychiatric/Behavioral:         Please refer to HPI    Medications: I have reviewed the patient's current medications.  Current Outpatient Medications  Medication Sig Dispense Refill   COMBIPATCH 0.05-0.14 MG/DAY Place 1 patch onto the skin 2 (two) times a week.     topiramate (TOPAMAX) 25 MG tablet Start with 1 tablet daily around 4-5pm; as tolerated, increase to 2 tablets daily (1 in the morning, and 2nd around 4-5pm)     atorvastatin (LIPITOR) 20 MG tablet TAKE  1 TABLET BY MOUTH EVERY DAY 90 tablet 1   EQUETRO 200 MG CP12 12 hr capsule TAKE 2 CAPSULES (400 MG TOTAL) BY MOUTH EVERY MORNING AND 3 CAPSULES (600 MG TOTAL) AT BEDTIME. 450 capsule 0   levothyroxine (SYNTHROID) 50 MCG tablet TAKE 1 TABLET BY MOUTH DAILY BEFORE BREAKFAST 90 tablet 1   lurasidone (LATUDA) 40 MG TABS tablet Take 1  tablet (40 mg total) by mouth daily with supper. 30 tablet 1   Multiple Vitamin (MULTIVITAMIN) tablet Take 1 tablet by mouth daily.     valACYclovir (VALTREX) 1000 MG tablet TAKE 1/2 TABLET BY MOUTH DAILY 45 tablet 1   No current facility-administered medications for this visit.    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   Please see review of systems for further details on the patient's review from today.   Objective:   Physical Exam:  LMP 11/29/2015   Physical Exam Constitutional:      General: She is not in acute distress. Musculoskeletal:        General: No deformity.  Neurological:     Mental Status: She is alert and oriented to person, place, and time.     Coordination: Coordination normal.  Psychiatric:        Attention and Perception: Attention and perception normal. She does not perceive auditory or visual hallucinations.        Mood and Affect: Affect is not labile, blunt, angry or inappropriate.        Speech: Speech normal.        Behavior: Behavior normal.        Thought Content: Thought content normal. Thought content is not paranoid or delusional. Thought content does not include homicidal or suicidal ideation. Thought content does not include homicidal or suicidal plan.        Cognition and Memory: Cognition and memory normal.         Judgment: Judgment normal.     Comments: Insight intact Mood is mildly anxious and depressed     Lab Review:     Component Value Date/Time   NA 139 01/10/2022 0830   K 4.4 01/10/2022 0830   CL 103 01/10/2022 0830   CO2 31 01/10/2022 0830   GLUCOSE 81 01/10/2022 0830   BUN 28 (H) 01/10/2022 0830   CREATININE 0.55 01/10/2022 0830   CREATININE 0.64 05/02/2021 1501   CALCIUM 9.3 01/10/2022 0830   PROT 7.2 10/10/2021 1333   ALBUMIN 4.4 10/10/2021 1333   AST 14 10/10/2021 1333   ALT 18 10/10/2021 1333   ALKPHOS 86 10/10/2021 1333   BILITOT 0.4 10/10/2021 1333   GFRNONAA >90 07/11/2013 1511   GFRAA >90 07/11/2013 1511       Component Value Date/Time   WBC 4.7 10/10/2021 1333  RBC 3.70 (L) 10/10/2021 1333   HGB 12.6 10/10/2021 1333   HCT 36.9 10/10/2021 1333   PLT 244.0 10/10/2021 1333   MCV 99.8 10/10/2021 1333   MCH 32.6 05/02/2021 1501   MCHC 34.0 10/10/2021 1333   RDW 12.3 10/10/2021 1333   LYMPHSABS 1.7 10/10/2021 1333   MONOABS 0.6 10/10/2021 1333   EOSABS 0.1 10/10/2021 1333   BASOSABS 0.0 10/10/2021 1333    No results found for: "POCLITH", "LITHIUM"   Lab Results  Component Value Date   CBMZ 6.7 05/02/2021     .res Assessment: Plan:    Discussed sleep disturbance and pt is unsure if insomnia has worsened with Topamax or decrease in Latuda dosage. She reports some continued improvement in TD. She reports that it is difficult to evaluate mood and anxiety symptoms in the context of recent work stressors. Discussed option to continue Latuda 40 mg po qd for a longer period of time to determine if TD continues to improve and sleep normalizes, or changing Latuda to an alternative medication, such as Wellsite geologist. Discussed potential benefits, risks, and side effects of Vraylar. Pt reports that she would like to continue current medication without changes at this time.  Will continue latuda 40 mg daily for mood stabilization.  Continue Equetro 400 mg in the morning and  600 mg at bedtime for mood stabilization.  Pt to follow-up with this provider in 4-6 weeks or sooner if clinically indicated.  Patient advised to contact office with any questions, adverse effects, or acute worsening in signs and symptoms.  I spent 30 minutes dedicated to the care of this patient on the date of this  encounter to include pre-visit review of records, face-to-face time with the patient discussing treatment options, ordering of medication, and post visit documentation.   Kindra was seen today for sleeping problem and medication problem.  Diagnoses and all orders for this visit:  Bipolar disorder, in partial remission, most recent episode mixed (HCC) -     lurasidone (LATUDA) 40 MG TABS tablet; Take 1 tablet (40 mg total) by mouth daily with supper.  Insomnia due to other mental disorder     Please see After Visit Summary for patient specific instructions.  Future Appointments  Date Time Provider Department Center  07/11/2022  8:20 AM Sandford Craze, NP [facility]  08/09/2022  4:00 PM Corie Chiquito, PMHNP [facility]CP None    No orders of the defined types were placed in this encounter.   -------------------------------

## 2022-07-11 ENCOUNTER — Telehealth: Payer: Self-pay | Admitting: Family

## 2022-07-11 ENCOUNTER — Ambulatory Visit (INDEPENDENT_AMBULATORY_CARE_PROVIDER_SITE_OTHER): Payer: Managed Care, Other (non HMO) | Admitting: Family

## 2022-07-11 VITALS — BP 105/75 | HR 61 | Temp 98.0°F | Resp 16 | Wt 199.0 lb

## 2022-07-11 DIAGNOSIS — Z8249 Family history of ischemic heart disease and other diseases of the circulatory system: Secondary | ICD-10-CM

## 2022-07-11 DIAGNOSIS — E785 Hyperlipidemia, unspecified: Secondary | ICD-10-CM

## 2022-07-11 DIAGNOSIS — R0683 Snoring: Secondary | ICD-10-CM

## 2022-07-11 DIAGNOSIS — E039 Hypothyroidism, unspecified: Secondary | ICD-10-CM

## 2022-07-11 DIAGNOSIS — E010 Iodine-deficiency related diffuse (endemic) goiter: Secondary | ICD-10-CM

## 2022-07-11 HISTORY — DX: Family history of ischemic heart disease and other diseases of the circulatory system: Z82.49

## 2022-07-11 LAB — BASIC METABOLIC PANEL
BUN: 19 mg/dL (ref 6–23)
CO2: 30 mEq/L (ref 19–32)
Calcium: 9.3 mg/dL (ref 8.4–10.5)
Chloride: 97 mEq/L (ref 96–112)
Creatinine, Ser: 0.61 mg/dL (ref 0.40–1.20)
GFR: 99.95 mL/min (ref >60.00)
Glucose, Bld: 88 mg/dL (ref 70–99)
Potassium: 4.3 mEq/L (ref 3.5–5.1)
Sodium: 134 mEq/L — ABNORMAL LOW (ref 135–145)

## 2022-07-11 LAB — LIPID PANEL
Cholesterol: 184 mg/dL (ref 0–200)
HDL: 68.3 mg/dL (ref >39.00)
LDL Cholesterol: 96 mg/dL (ref 0–99)
NonHDL: 115.67
Total CHOL/HDL Ratio: 3
Triglycerides: 96 mg/dL (ref 0.0–149.0)
VLDL: 19.2 mg/dL (ref 0.0–40.0)

## 2022-07-11 LAB — TSH: TSH: 1.85 u[IU]/mL (ref 0.35–5.50)

## 2022-07-11 MED ORDER — ATORVASTATIN CALCIUM 20 MG PO TABS: 20.0000 mg | ORAL_TABLET | Freq: Every day | ORAL | 1 refills | Status: DC

## 2022-07-11 NOTE — Assessment & Plan Note (Signed)
Clinically stable on current dose of synthroid. Will obtain follow up tsh.  

## 2022-07-11 NOTE — Assessment & Plan Note (Signed)
Will refer to cardiology for further evaluation.  

## 2022-07-11 NOTE — Assessment & Plan Note (Signed)
Not clear that she ever followed through with sleep study.  See Mychart Message to patient.  

## 2022-07-11 NOTE — Progress Notes (Signed)
Subjective:     Patient ID: Diana Decker, female    DOB: May 02, 1965, 58 y.o.   MRN: [id number]  Chief Complaint  Patient presents with   Hypothyroidism    Here for follow up   Hyperlipidemia    Here for follow up    Hyperlipidemia   Patient is in today for follow up. She tells me that all of her siblings have had heart related medical issues.  Younger brother (full brother) Was told enlarged heart.  1/2 siblings brother and sister (same mom) both recently diagnosed with aortic aneurysm, ?enlarged heart, dad had heart attack at age 102. She desires further cardiac evaluation.  Hypothyroid-  Lab Results  Component Value Date   TSH 2.42 01/10/2022   Hyperlipidemia- maintainedon atorvasttin.   Lab Results  Component Value Date   CHOL 181 10/10/2021   HDL 65.40 10/10/2021   LDLCALC 98 10/10/2021   LDLDIRECT 191.0 04/24/2019   TRIG 85.0 10/10/2021   CHOLHDL 3 10/10/2021    Bipolar disorder- continues to follow with psychiatry. Reports mood is stable.    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, cancerous lesion removed    Past Surgical History:  Procedure Laterality Date   APPENDECTOMY     COLONOSCOPY  05/03/2009   Normal - [facility]   EYE SURGERY Left    moh's procedure left eyelid   FOOT SURGERY Right    HERNIA REPAIR     inguinal   SQUAMOUS CELL CARCINOMA EXCISION Left    Mohs procedure   UPPER GASTROINTESTINAL ENDOSCOPY  05/03/2009   [facility]    WISDOM TOOTH EXTRACTION      Family History  Problem Relation Age of Onset   Breast cancer Mother    Depression Mother    Prostate cancer Father    Heart disease Father    Alcohol abuse Father    Depression Father    Anxiety disorder Father    Pancreatic cancer Brother    Cancer Maternal Grandfather        lung?   Depression Maternal Uncle    Alcohol abuse Maternal Uncle    Suicidality Maternal Uncle    Alcohol abuse Half-Sister    Bipolar disorder Half-Sister    Obesity Half-Sister    Diabetes type II Half-Sister    Breast cancer Niece    Colon cancer Neg Hx    Rectal cancer Neg Hx    Stomach cancer Neg Hx     Social History   Socioeconomic History   Marital status: Married    Spouse name: Not on file   Number of children: Not on file   Years of education: Not on file   Highest education level: Master's degree (e.g., MA, MS, MEng, MEd, MSW, MBA)  Occupational History   Occupation: Horticulturist, commercial  Tobacco Use   Smoking status: Never   Smokeless tobacco:  Never  Vaping Use   Vaping Use: Never used  Substance and Sexual Activity   Alcohol use: No   Drug use: No   Sexual activity: Yes    Birth control/protection: None    Comment: husband with vasectomy  Other Topics Concern   Not on file  Social History Narrative   Regular exercise-yes   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    Social Determinants of Health   Financial Resource Strain: Low Risk  (07/10/2022)   Overall Financial Resource Strain (CARDIA)    Difficulty of Paying Living Expenses: Not hard at all  Food Insecurity: No Food Insecurity (07/10/2022)   Hunger Vital Sign    Worried About Running Out of Food in the Last Year: Never true    Ran Out of Food in the Last Year: Never true  Transportation Needs: No Transportation Needs (07/10/2022)   PRAPARE - Administrator, Civil Service (Medical): No    Lack of Transportation (Non-Medical): No   Physical Activity: Insufficiently Active (07/10/2022)   Exercise Vital Sign    Days of Exercise per Week: 4 days    Minutes of Exercise per Session: 30 min  Stress: Stress Concern Present (07/10/2022)   [employer] of Occupational Health - Occupational Stress Questionnaire    Feeling of Stress : To some extent  Social Connections: Moderately Isolated (07/10/2022)   Social Connection and Isolation Panel [NHANES]    Frequency of Communication with Friends and Family: More than three times a week    Frequency of Social Gatherings with Friends and Family: More than three times a week    Attends Religious Services: Never    Database administrator or Organizations: No    Attends Engineer, structural: Not on file    Marital Status: Married  Catering manager Violence: Not on file    Outpatient Medications Prior to Visit  Medication Sig Dispense Refill   COMBIPATCH 0.05-0.14 MG/DAY Place 1 patch onto the skin 2 (two) times a week.     EQUETRO 200 MG CP12 12 hr capsule TAKE 2 CAPSULES (400 MG TOTAL) BY MOUTH EVERY MORNING AND 3 CAPSULES (600 MG TOTAL) AT BEDTIME. 450 capsule 0   levothyroxine (SYNTHROID) 50 MCG tablet TAKE 1 TABLET BY MOUTH DAILY BEFORE BREAKFAST 90 tablet 1   lurasidone (LATUDA) 40 MG TABS tablet Take 1 tablet (40 mg total) by mouth daily with supper. 30 tablet 1   Multiple Vitamin (MULTIVITAMIN) tablet Take 1 tablet by mouth daily.     topiramate (TOPAMAX) 25 MG tablet Start with 1 tablet daily around 4-5pm; as tolerated, increase to 2 tablets daily (1 in the morning, and 2nd around 4-5pm)     valACYclovir (VALTREX) 1000 MG tablet TAKE 1/2 TABLET BY MOUTH DAILY 45 tablet 1   atorvastatin (LIPITOR) 20 MG tablet TAKE 1 TABLET BY MOUTH EVERY DAY 90 tablet 1   No facility-administered medications prior to visit.    No Known Allergies  ROS See HPI    Objective:    Physical Exam Constitutional:      Appearance: She is well-developed.  Neck:     Thyroid:  Thyromegaly (seems more pronounced than I remember (L>R)) present.  Cardiovascular:     Rate and Rhythm: Normal rate and regular rhythm.     Heart sounds: Normal heart sounds. No murmur heard. Pulmonary:     Effort: Pulmonary effort is normal. No respiratory distress.  Breath sounds: Normal breath sounds. No wheezing.  Psychiatric:        Behavior: Behavior normal.        Thought Content: Thought content normal.        Judgment: Judgment normal.     BP 105/75 (BP Location: Right Arm, Patient Position: Sitting, Cuff Size: Small)   Pulse 61   Temp 98 F (36.7 C) (Oral)   Resp 16   Wt 199 lb (90.3 kg)   LMP 11/29/2015   SpO2 100%   BMI 31.17 kg/m  Wt Readings from Last 3 Encounters:  07/11/22 199 lb (90.3 kg)  03/09/22 189 lb 9.6 oz (86 kg)  02/20/22 193 lb (87.5 kg)       Assessment & Plan:   Problem List Items Addressed This Visit       Unprioritized   Hypothyroidism (Chronic)    Clinically stable on current dose of synthroid.  Will obtain follow up tsh.       Relevant Orders   TSH   Snoring    Not clear that she ever followed through with sleep study.  See Mychart Message to patient.        Hyperlipidemia    Tolerating atorvastatin. Will update lipid panel.       Relevant Medications   atorvastatin (LIPITOR) 20 MG tablet   Other Relevant Orders   Lipid panel   Basic Metabolic Panel (BMET)   Family history of heart disease - Primary    Will refer to cardiology for further evaluation.        Relevant Orders   Ambulatory referral to Cardiology   Other Visit Diagnoses     Thyromegaly       Relevant Orders   US THYROID       I have changed Marikay Alar Yow's atorvastatin. I am also having her maintain her multivitamin, valACYclovir, levothyroxine, CombiPatch, Equetro, topiramate, and lurasidone.  Meds ordered this encounter  Medications   atorvastatin (LIPITOR) 20 MG tablet    Sig: Take 1 tablet (20 mg total) by mouth daily.    Dispense:  90  tablet    Refill:  1    Order Specific Question:   Supervising Provider    Answer:   Danise Edge A [[id number]]

## 2022-07-11 NOTE — Telephone Encounter (Signed)
See mychart.  

## 2022-07-11 NOTE — Assessment & Plan Note (Signed)
Tolerating atorvastatin. Will update lipid panel.

## 2022-07-11 NOTE — Assessment & Plan Note (Signed)
Not clear that she ever followed through with sleep study.  See Mychart Message to patient.

## 2022-07-12 ENCOUNTER — Other Ambulatory Visit: Payer: Self-pay | Admitting: Family

## 2022-07-12 MED ORDER — LEVOTHYROXINE SODIUM 50 MCG PO TABS: 50.0000 ug | ORAL_TABLET | Freq: Every day | ORAL | 1 refills | Status: DC

## 2022-07-20 ENCOUNTER — Ambulatory Visit (HOSPITAL_BASED_OUTPATIENT_CLINIC_OR_DEPARTMENT_OTHER)
Admission: RE | Admit: 2022-07-20 | Discharge: 2022-07-20 | Disposition: A | Discharge status: Home / Self Care | Payer: Managed Care, Other (non HMO) | Source: Ambulatory Visit | Attending: Family | Admitting: Family

## 2022-07-20 DIAGNOSIS — E010 Iodine-deficiency related diffuse (endemic) goiter: Secondary | ICD-10-CM | POA: Diagnosis present

## 2022-08-09 ENCOUNTER — Encounter: Payer: Self-pay | Admitting: Psychiatry

## 2022-08-09 ENCOUNTER — Ambulatory Visit (INDEPENDENT_AMBULATORY_CARE_PROVIDER_SITE_OTHER): Payer: 59 | Admitting: Psychiatry

## 2022-08-09 DIAGNOSIS — F3177 Bipolar disorder, in partial remission, most recent episode mixed: Secondary | ICD-10-CM | POA: Diagnosis not present

## 2022-08-09 DIAGNOSIS — F3162 Bipolar disorder, current episode mixed, moderate: Secondary | ICD-10-CM

## 2022-08-09 DIAGNOSIS — F99 Mental disorder, not otherwise specified: Secondary | ICD-10-CM

## 2022-08-09 DIAGNOSIS — F5105 Insomnia due to other mental disorder: Secondary | ICD-10-CM | POA: Diagnosis not present

## 2022-08-09 MED ORDER — CARIPRAZINE HCL 1.5 MG PO CAPS: 1.5000 mg | ORAL_CAPSULE | Freq: Every day | ORAL | 1 refills | Status: DC

## 2022-08-09 MED ORDER — EQUETRO 200 MG PO CP12: ORAL_CAPSULE | ORAL | 0 refills | Status: DC

## 2022-08-09 MED ORDER — LURASIDONE HCL 40 MG PO TABS: ORAL_TABLET | ORAL | 1 refills | Status: DC

## 2022-08-09 NOTE — Progress Notes (Signed)
Diana Decker [id number] [birth year] 57 y.o.  Subjective:   Patient ID:  Diana Decker is a 57 y.o. (DOB 1965-03-11) female.  Chief Complaint:  Chief Complaint  Patient presents with   Medication Problem    TD with Latuda    HPI [name] presents to the office today for follow-up of Bipolar Disorder, Anxiety, and insomnia.   She reports that she continues to experience "some twitching, it kind of comes and goes." She reports involuntary movements are less intense. She reports that movements occur less than daily.   She reports that Latuda 40 mg is not as effective for her mood symptoms. She notices "more mood fluctuations" compared to 60 mg dose. She reports some depressive episodes at times. 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 She reports that she had to resist urge to go into work on the weekend when it was not necessary. She reports that she has spent some money for clothes for her new position. Denies excessive talking. She estimates sleeping 6-7 hours a night. She is waking up feeling hot at night. She reports that she started drinking cherry juice and this has helped with her sleep. Energy and motivation have been "average" and not low or excessive. She reports that she had gained 15 lbs over the last 6 months. Concentration has been ok.   She reports that anxiety was higher when covering 2 jobs. She reports that her anxiety has been less since transitioning to one job only.   She reports some vague, fleeting suicidal thoughts without intent.   She reports that she returned to OA to get some support. She has attended some OA meetings online and has found this to be helpful.   She will be switching to new insurance July 1st.   Past Psychiatric Medication Trials: Kasandra Knudsen- Had restless  legs on higher doses.  Good response to 60 mg dose Equetro-effective for mood stabilization without visual changes Carbamazepine (generic)-had vision changes and drowsiness Abilify-effective.  Experienced compulsive eating and spending.  Weight gain. Saphris Depakote-some weight gain Lithium-took briefly Serzone Trazodone Lamictal-caused insomnia Melatonin   AIMS    Flowsheet Row Office Visit from 07/05/2022 in [facility] Office Visit from 05/17/2022 in [facility] Office Visit from 11/16/2021 in [facility] Office Visit from 01/19/2021 in [facility] Office Visit from 09/12/2018 in [facility]  AIMS Total Score 3 4 0 0 0      PHQ2-9    Flowsheet Row Office Visit from 01/10/2022 in [facility] Office Visit from 07/01/2017 in [facility] Office Visit from 05/29/2016 in [facility]  PHQ-2 Total Score 0 0 4  PHQ-9 Total Score -- 2 13        Review of Systems:  Review of Systems  Cardiovascular:        She has made an apt to see cardiologist.  Older brother and sister have been dx'd with aortic aneurysm and younger brother also has been diagnosed with cardiac issue.   Musculoskeletal:  Negative for gait problem.       Knee pain  Neurological:  Negative for tremors.  Psychiatric/Behavioral:         Please refer to HPI    Medications: I have reviewed the patient's current medications.  Current Outpatient Medications  Medication Sig Dispense Refill   atorvastatin (LIPITOR) 20 MG tablet Take 1 tablet (20 mg total) by mouth daily. 90 tablet 1   [START ON 08/13/2022] cariprazine (VRAYLAR) 1.5 MG capsule Take 1 capsule (1.5 mg total) by mouth daily. 30 capsule 1   levothyroxine (SYNTHROID) 50 MCG tablet Take 1  tablet (50 mcg total) by mouth daily before breakfast. 90 tablet 1   Multiple Vitamin (MULTIVITAMIN) tablet Take 1 tablet by mouth daily.     topiramate (TOPAMAX) 25 MG tablet Take 25 mg by mouth daily.     valACYclovir (VALTREX) 1000 MG tablet TAKE 1/2 TABLET BY MOUTH DAILY 45 tablet 1   COMBIPATCH 0.05-0.14 MG/DAY Place 1 patch onto the skin 2 (two) times a week. (Patient not taking: Reported on 08/09/2022)     [START ON 08/13/2022] EQUETRO 200 MG CP12 12 hr capsule TAKE 2 CAPSULES (400 MG TOTAL) BY MOUTH EVERY MORNING AND 3 CAPSULES (600 MG TOTAL) AT BEDTIME. 450 capsule 0   lurasidone (LATUDA) 40 MG TABS tablet Take 1/2 tablet in the evening for one week, then stop 30 tablet 1   No current facility-administered medications for this visit.    Medication Side Effects: Other: Some occasional involuntary orofacial movements.  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   Please see review of systems for further details on the patient's review from today.   Objective:   Physical Exam:  LMP 11/29/2015   Physical Exam Constitutional:      General: She is not in acute distress. Musculoskeletal:        General: No deformity.  Neurological:     Mental Status: She is alert and oriented to person, place, and time.     Coordination: Coordination normal.  Psychiatric:        Attention and Perception:  Attention and perception normal. She does not perceive auditory or visual hallucinations.        Mood and Affect: Affect is not labile, blunt, angry or inappropriate.        Speech: Speech normal.        Behavior: Behavior normal.        Thought Content: Thought content normal. Thought content is not paranoid or delusional. Thought content does not include homicidal or suicidal ideation. Thought content does not include homicidal or suicidal plan.        Cognition and Memory: Cognition and memory normal.        Judgment: Judgment normal.     Comments: She reports recent mood lability     Lab Review:     Component Value Date/Time   NA 134 (L) 07/11/2022 1610  K 4.3 07/11/2022 0841   CL 97 07/11/2022 0841   CO2 30 07/11/2022 0841   GLUCOSE 88 07/11/2022 0841   BUN 19 07/11/2022 0841   CREATININE 0.61 07/11/2022 0841   CREATININE 0.64 05/02/2021 1501   CALCIUM 9.3 07/11/2022 0841   PROT 7.2 10/10/2021 1333   ALBUMIN 4.4 10/10/2021 1333   AST 14 10/10/2021 1333   ALT 18 10/10/2021 1333   ALKPHOS 86 10/10/2021 1333   BILITOT 0.4 10/10/2021 1333   GFRNONAA >90 07/11/2013 1511   GFRAA >90 07/11/2013 1511       Component Value Date/Time   WBC 4.7 10/10/2021 1333   RBC 3.70 (L) 10/10/2021 1333   HGB 12.6 10/10/2021 1333   HCT 36.9 10/10/2021 1333   PLT 244.0 10/10/2021 1333   MCV 99.8 10/10/2021 1333   MCH 32.6 05/02/2021 1501   MCHC 34.0 10/10/2021 1333   RDW 12.3 10/10/2021 1333   LYMPHSABS 1.7 10/10/2021 1333   MONOABS 0.6 10/10/2021 1333   EOSABS 0.1 10/10/2021 1333   BASOSABS 0.0 10/10/2021 1333    No results found for: "POCLITH", "LITHIUM"   Lab Results  Component Value Date   CBMZ 6.7 05/02/2021     .res Assessment: Plan:    I spent 35 minutes dedicated to the care of this patient on the date of this  encounter to include pre-visit review of records, face-to-face time with the patient discussing response to decrease in Latuda and treatment options, updating  new information re: family cardiac history, ordering of medication, and post visit documentation. Discussed response to decrease in [place]. Pt reports that TD has improved some with decrease in Latuda to 40 mg daily, however she is noticing some worsening mood symptoms and would like to consider change in treatment.  Discussed potential benefits, risks, and side effects of Vraylar. Will start Vraylar 1.5 mg daily for Bipolar Disorder. Will decrease Latuda to 20 mg daily with supper for one week, then stop.  Will continue Equetro 400 mg in the morning and 600 mg at bedtime for mood stabilization. Pt to follow-up in 6 months or sooner if clinically indicated.  Patient advised to contact office with any questions, adverse effects, or acute worsening in signs and symptoms.   Rhunette was seen today for medication problem.  Diagnoses and all orders for this visit:  Bipolar disorder, in partial remission, most recent episode mixed (HCC) -     cariprazine (VRAYLAR) 1.5 MG capsule; Take 1 capsule (1.5 mg total) by mouth daily. -     lurasidone (LATUDA) 40 MG TABS tablet; Take 1/2 tablet in the evening for one week, then stop -     EQUETRO 200 MG CP12 12 hr capsule; TAKE 2 CAPSULES (400 MG TOTAL) BY MOUTH EVERY MORNING AND 3 CAPSULES (600 MG TOTAL) AT BEDTIME.  Insomnia due to other mental disorder     Please see After Visit Summary for patient specific instructions.  Future Appointments  Date Time Provider Department Center  09/20/2022  8:30 AM Corie Chiquito, PMHNP [facility] None  09/20/2022  1:40 PM Revankar, Aundra Dubin, MD [facility] None  01/15/2023  8:00 AM Sandford Craze, NP [facility]    No orders of the defined types were placed in this encounter.   -------------------------------

## 2022-08-17 ENCOUNTER — Telehealth: Payer: Self-pay | Admitting: Psychiatry

## 2022-08-17 ENCOUNTER — Other Ambulatory Visit: Payer: Self-pay | Admitting: Psychiatry

## 2022-08-17 DIAGNOSIS — F3177 Bipolar disorder, in partial remission, most recent episode mixed: Secondary | ICD-10-CM

## 2022-08-17 MED ORDER — EQUETRO 200 MG PO CP12: ORAL_CAPSULE | ORAL | 0 refills | Status: DC

## 2022-08-17 NOTE — Telephone Encounter (Signed)
Please see message from patient

## 2022-08-17 NOTE — Telephone Encounter (Signed)
Pt called with new insurance info [employer] and reporting with [employer] provider had to write letter in order for insurance to pay for BRAND EQUETRO. Pt has bad headaches with generic. Contact pt at [phone]

## 2022-08-17 NOTE — Telephone Encounter (Signed)
I re-submitted script with note that brand name is medically necessary due to patient having headaches and blurred vision with generic. Her new insurance with Monia Pouch took effect 08/13/22.

## 2022-08-19 NOTE — Telephone Encounter (Signed)
PA initiated for BRAND and QUANTITY of #450/90 days for [facility]

## 2022-08-20 NOTE — Telephone Encounter (Signed)
Insurance changed, needs PA for brand [employer]

## 2022-08-21 ENCOUNTER — Telehealth: Payer: Self-pay | Admitting: Psychiatry

## 2022-08-21 NOTE — Telephone Encounter (Signed)
Form faxed with same questions from electronic submission with CMM but form completed again and office notes faxed this morning.

## 2022-08-21 NOTE — Telephone Encounter (Signed)
This is pending

## 2022-08-21 NOTE — Telephone Encounter (Signed)
[employer]- Approved  EQUATRO 200mg   from 08/21/2022-08/21/2023

## 2022-08-22 NOTE — Telephone Encounter (Signed)
Prior Approval received for Equetro 200 mg capsule effective 08/21/2022-08/21/2023 with [employer]

## 2022-08-22 NOTE — Telephone Encounter (Signed)
LVM on VM per DPR.

## 2022-08-23 NOTE — Telephone Encounter (Signed)
See phone message already approved

## 2022-09-12 ENCOUNTER — Other Ambulatory Visit: Payer: Self-pay

## 2022-09-12 DIAGNOSIS — B009 Herpesviral infection, unspecified: Secondary | ICD-10-CM | POA: Insufficient documentation

## 2022-09-12 DIAGNOSIS — F603 Borderline personality disorder: Secondary | ICD-10-CM | POA: Insufficient documentation

## 2022-09-12 DIAGNOSIS — N920 Excessive and frequent menstruation with regular cycle: Secondary | ICD-10-CM | POA: Insufficient documentation

## 2022-09-12 DIAGNOSIS — T7840XA Allergy, unspecified, initial encounter: Secondary | ICD-10-CM | POA: Insufficient documentation

## 2022-09-12 DIAGNOSIS — D649 Anemia, unspecified: Secondary | ICD-10-CM | POA: Insufficient documentation

## 2022-09-12 DIAGNOSIS — D099 Carcinoma in situ, unspecified: Secondary | ICD-10-CM | POA: Insufficient documentation

## 2022-09-12 DIAGNOSIS — F1020 Alcohol dependence, uncomplicated: Secondary | ICD-10-CM | POA: Insufficient documentation

## 2022-09-12 HISTORY — DX: Excessive and frequent menstruation with regular cycle: N92.0

## 2022-09-12 HISTORY — DX: Herpesviral infection, unspecified: B00.9

## 2022-09-13 ENCOUNTER — Telehealth: Payer: Self-pay | Admitting: Psychiatry

## 2022-09-13 NOTE — Telephone Encounter (Signed)
Diana Decker called and wanted to know about her PA for her Vraylar. She states the Diana Decker has been authorized. Her pharmacy has not seen the prior auth come thru on the Vraylar. Do you happen to know the time frame of the Vraylar prior auth? Thanks Diana Decker

## 2022-09-20 ENCOUNTER — Encounter: Payer: Self-pay | Admitting: Cardiology

## 2022-09-20 ENCOUNTER — Ambulatory Visit: Payer: 59 | Attending: Cardiology | Admitting: Cardiology

## 2022-09-20 ENCOUNTER — Encounter: Payer: Self-pay | Admitting: Psychiatry

## 2022-09-20 ENCOUNTER — Ambulatory Visit (INDEPENDENT_AMBULATORY_CARE_PROVIDER_SITE_OTHER): Payer: 59 | Admitting: Psychiatry

## 2022-09-20 VITALS — BP 116/72 | HR 67 | Ht 67.0 in | Wt 208.1 lb

## 2022-09-20 DIAGNOSIS — R011 Cardiac murmur, unspecified: Secondary | ICD-10-CM

## 2022-09-20 DIAGNOSIS — F99 Mental disorder, not otherwise specified: Secondary | ICD-10-CM

## 2022-09-20 DIAGNOSIS — Z8249 Family history of ischemic heart disease and other diseases of the circulatory system: Secondary | ICD-10-CM | POA: Diagnosis not present

## 2022-09-20 DIAGNOSIS — F5105 Insomnia due to other mental disorder: Secondary | ICD-10-CM

## 2022-09-20 DIAGNOSIS — E782 Mixed hyperlipidemia: Secondary | ICD-10-CM

## 2022-09-20 DIAGNOSIS — F419 Anxiety disorder, unspecified: Secondary | ICD-10-CM | POA: Diagnosis not present

## 2022-09-20 DIAGNOSIS — F3162 Bipolar disorder, current episode mixed, moderate: Secondary | ICD-10-CM

## 2022-09-20 MED ORDER — BREXPIPRAZOLE 1 MG PO TABS: ORAL_TABLET | ORAL | Status: DC

## 2022-09-20 NOTE — Progress Notes (Signed)
Cardiology Office Note:    Date:  09/20/2022   ID:  Diana Decker, DOB 1965-02-17, MRN [id number]  PCP:  Sandford Craze, NP  Cardiologist:  Garwin Brothers, MD   Referring MD: Sandford Craze, NP    ASSESSMENT:    1. Family history of heart disease   2. Murmur, cardiac   3. Mixed dyslipidemia   4. Morbid obesity (HCC)   5. Cardiac murmur    PLAN:    In order of problems listed above:  Primary prevention stressed with the patient.  Importance of compliance with diet medication stressed and patient verbalized standing. Cardiac murmur: Echocardiogram will be done to assess murmur heard on auscultation. Mixed dyslipidemia: On lipid-lowering medications followed by primary care.  Diet emphasized.  Numbers reviewed with her. Coronary risk stratification: I discussed calcium score and she is agreeable.  This will also help me assess the aortic size. Obesity: Weight reduction stressed risks of obesity explained and she promises to do better. Patient will be Decker in follow-up appointment in 6 months or earlier if the patient has any concerns.    Medication Adjustments/Labs and Tests Ordered: Current medicines are reviewed at length with the patient today.  Concerns regarding medicines are outlined above.  Orders Placed This Encounter  Procedures   CT CARDIAC SCORING   EKG 12-Lead   ECHOCARDIOGRAM COMPLETE   No orders of the defined types were placed in this encounter.    History of Present Illness:    Diana Decker is a 57 y.o. female who is being Decker today for the evaluation of mixed dyslipidemia and for coronary restratification at the request of Sandford Craze, NP.  Patient mentions to me that she has 2 older siblings diagnosed with aortic aneurysm and therefore she was told to be evaluated for the same.  She also mentions to me that her dad passed away and she was 89 years old and he was around her age.  For this reason she is here for evaluation.  She tells  me that she has lost significant amount of weight and exercise.  She exercises at the gym on a regular basis and with this she is asymptomatic.  At the time of my evaluation, the patient is alert awake oriented and in no distress.  Past Medical History:  Diagnosis Date   Alcoholism (HCC)    quit 09/23/1999   Allergy    Anemia    ANXIETY 04/21/2009   Qualifier: Diagnosis of   By: Myrtie Hawk, Amy S     Replacing diagnoses that were inactivated after the 05/14/22 regulatory import     Bipolar 1 disorder (HCC)    Borderline personality disorder (HCC)    Family history of heart disease 07/11/2022   Genital herpes 05/29/2016   GERD 04/21/2009   Qualifier: Diagnosis of   By: Monica Becton PA-c, Amy S        Herpes simplex 09/12/2022   daily suppression with valtrex, requests 1000mg  tabs that she cuts in half     Hyperlipidemia 08/19/2017   Hypothyroidism 06/08/2010   Insomnia due to medical condition 04/25/2015   Menopausal hot flushes 04/25/2015   Menorrhagia 09/12/2022   periods 4 days, 2 are heavy changing a pad 2-3 hrs, pt started OCP temporarily and discontinued 04/2015. No period until 11/2015, then had 3 day period. Pt will monitor and call with problems. 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 Has traumatic history herself. Hospitalized 04/2015 for depression/grief when close client died of overdose. As of 12/12/15 doing much better. Will be attending [facility] "Breakthrough" retreat program for self-care.     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   brexpiprazole (REXULTI) 1 MG TABS tablet Take 0.5 mg daily for one week, then increase to 1 mg daily   CLIMARA PRO 0.045-0.015 MG/DAY Place 1 patch onto the skin once a week.   COMBIPATCH 0.05-0.14 MG/DAY Place 1 patch onto the skin 2 (two) times a week.   EQUETRO 200 MG CP12 12 hr capsule TAKE 2 CAPSULES (400 MG TOTAL) BY MOUTH EVERY MORNING AND 3 CAPSULES (600 MG TOTAL) AT BEDTIME.   levothyroxine (SYNTHROID) 50 MCG tablet Take 1 tablet (50 mcg total) by mouth daily before breakfast.   Multiple Vitamin (MULTIVITAMIN) tablet Take 1 tablet by mouth daily.   topiramate (TOPAMAX) 25 MG tablet Take 25 mg by mouth daily.   valACYclovir (VALTREX) 1000 MG tablet TAKE 1/2 TABLET BY MOUTH DAILY     Allergies:   Patient has no known allergies.   Social History   Socioeconomic History   Marital status: Married    Spouse name: Not on file   Number of children: Not on file   Years of education: Not on file   Highest education level: Master's degree (e.g., MA, MS, MEng, MEd, MSW, MBA)  Occupational History   Occupation: Horticulturist, commercial  Tobacco Use   Smoking status: Never   Smokeless tobacco: Never  Vaping Use   Vaping status: Never Used  Substance and Sexual Activity   Alcohol use: No   Drug use: No   Sexual activity: Yes    Birth control/protection: None    Comment: husband with vasectomy  Other Topics Concern   Not on file  Social History Narrative   Regular exercise-yes   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    Social Determinants of Health   Financial Resource Strain: Low Risk  (07/10/2022)   Overall Financial Resource Strain (CARDIA)    Difficulty of Paying Living Expenses: Not hard at all  Food Insecurity: No Food Insecurity (07/10/2022)   Hunger Vital Sign    Worried About Running Out of Food in the Last Year: Never true    Ran Out of Food in the Last Year: Never true  Transportation Needs: No Transportation Needs (07/10/2022)   PRAPARE - Transportation    Lack of Transportation (  Medical): No    Lack of Transportation (Non-Medical): No  Physical Activity: Insufficiently Active (07/10/2022)   Exercise Vital Sign    Days of Exercise per Week: 4 days    Minutes of Exercise per Session: 30 min  Stress: Stress Concern Present (07/10/2022)   [employer] of Occupational Health - Occupational Stress Questionnaire    Feeling of Stress : To some extent  Social Connections: Moderately Isolated (07/10/2022)   Social Connection and Isolation Panel [NHANES]    Frequency of Communication with Friends and Family: More than three times a week    Frequency of Social Gatherings with Friends and Family: More than three times a week    Attends Religious Services: Never    Database administrator or Organizations: No    Attends Engineer, structural: Not on file    Marital Status: Married     Family History: The patient's family history includes Alcohol abuse in her father, half-sister, and maternal uncle; Anxiety disorder in her father; Aortic aneurysm in her half-brother and half-sister; Bipolar disorder in her half-sister; Breast cancer in her mother and niece; Cancer in her maternal grandfather; Depression in her father, maternal uncle, and mother; Diabetes type II in her half-sister; Heart attack in her father; Heart disease in her brother and father; Obesity in her half-sister; Pancreatic cancer in her brother; Prostate cancer in her  father; Suicidality in her maternal uncle. There is no history of Colon cancer, Rectal cancer, or Stomach cancer.  ROS:   Please see the history of present illness.    All other systems reviewed and are negative.  EKGs/Labs/Other Studies Reviewed:    The following studies were reviewed today: .Marland KitchenEKG Interpretation Date/Time:  Thursday September 20 2022 13:47:15 EDT Ventricular Rate:  67 PR Interval:  160 QRS Duration:  72 QT Interval:  362 QTC Calculation: 382 R Axis:   81  Text Interpretation: Normal sinus rhythm Normal ECG No previous ECGs available Confirmed by Belva Crome [phone]) on 09/20/2022 2:01:31 PM   EKG Interpretation Date/Time:  Thursday September 20 2022 13:47:15 EDT Ventricular Rate:  67 PR Interval:  160 QRS Duration:  72 QT Interval:  362 QTC Calculation: 382 R Axis:   81  Text Interpretation: Normal sinus rhythm Normal ECG No previous ECGs available Confirmed by Belva Crome [phone]) on 09/20/2022 2:01:31 PM     Recent Labs: 10/10/2021: ALT 18; Hemoglobin 12.6; Platelets 244.0 07/11/2022: BUN 19; Creatinine, Ser 0.61; Potassium 4.3; Sodium 134; TSH 1.85  Recent Lipid Panel    Component Value Date/Time   CHOL 184 07/11/2022 0841   TRIG 96.0 07/11/2022 0841   HDL 68.30 07/11/2022 0841   CHOLHDL 3 07/11/2022 0841   VLDL 19.2 07/11/2022 0841   LDLCALC 96 07/11/2022 0841   LDLCALC 101 (H) 08/26/2020 1440   LDLDIRECT 191.0 04/24/2019 1402    Physical Exam:    VS:  BP 116/72   Pulse 67   Ht 5\' 7"  (1.702 m)   Wt 208 lb 1.3 oz (94.4 kg)   LMP 11/29/2015   SpO2 98%   BMI 32.59 kg/m     Wt Readings from Last 3 Encounters:  09/20/22 208 lb 1.3 oz (94.4 kg)  07/11/22 199 lb (90.3 kg)  03/09/22 189 lb 9.6 oz (86 kg)     GEN: Patient is in no acute distress HEENT: Normal NECK: No JVD; No carotid bruits LYMPHATICS: No lymphadenopathy CARDIAC: S1 S2 regular, 2/6 systolic murmur at the apex. RESPIRATORY:  Clear to auscultation  without rales, wheezing or  rhonchi  ABDOMEN: Soft, non-tender, non-distended MUSCULOSKELETAL:  No edema; No deformity  SKIN: Warm and dry NEUROLOGIC:  Alert and oriented x 3 PSYCHIATRIC:  Normal affect    Signed, Garwin Brothers, MD  09/20/2022 2:14 PM    [facility]

## 2022-09-20 NOTE — Progress Notes (Signed)
Diana Decker [id number] 1965-08-23 57 y.o.  Subjective:   Patient ID:  Diana Decker is a 57 y.o. (DOB 1966/01/18) female.  Chief Complaint:  Chief Complaint  Patient presents with   Manic Behavior    Irritability, increased goal-directed activity    HPI Diana Decker presents to the office today for follow-up of Bipolar Disorder, anxiety, and insomnia. She reports, "I've been having a rough time" and feels as if she is not taking any medication. She reports, "I don't feel stable." She reports irritable mood and "blasted" a Radio broadcast assistant. She reports, "last weekend I was super manic" and cleaning her house at 3 am on Saturday morning and then cleaned her mother's house. She reports that there may be "some depression out of frustration" and not sleeping. Reports feeling "weepy." She reports some "over-thinking" and anxious thoughts. She reports that she is having some difficulty with concentration, "but I'm getting my work done. It's harder than usual." She reports that her energy is very low. Poor sleep. Reports going to bed early, having difficulty getting to sleep, awakens for awhile and unable to get back to sleep. She reports that her appetite has been increased and was not as consistent with weight loss plan. She reports that "the last couple of weeks I have decided to get back on track food and exercise-wise." She reports that she had some suicidal thoughts a couple of weeks ago and this subsided with resuming exercise.   She reports that she has not been sleeping well.   Denies restlessness. She reports that TD has almost resolved.   Reports that   Past Psychiatric Medication Trials: Latuda- Had restless legs on higher doses.  Good response to 60 mg dose Equetro-effective for mood stabilization without visual changes Carbamazepine (generic)-had vision changes and drowsiness Abilify-effective.  Experienced compulsive eating and spending.  Weight gain. Vraylar Saphris Depakote-some  weight gain Lithium-took briefly Serzone Trazodone Lamictal-caused insomnia Melatonin  AIMS    Flowsheet Row Office Visit from 07/05/2022 in [facility] Office Visit from 05/17/2022 in [facility] Office Visit from 11/16/2021 in [facility] Office Visit from 01/19/2021 in [facility] Office Visit from 09/12/2018 in [facility]  AIMS Total Score 3 4 0 0 0      PHQ2-9    Flowsheet Row Office Visit from 01/10/2022 in [facility] Office Visit from 07/01/2017 in [facility] at [facility] Office Visit from 05/29/2016 in [facility] at [facility]  PHQ-2 Total Score 0 0 4  PHQ-9 Total Score -- 2 13        Review of Systems:  Review of Systems  Musculoskeletal:  Negative for gait problem.  Neurological:  Positive for headaches. Negative for tremors.  Psychiatric/Behavioral:         Please refer to HPI    Medications: I have reviewed the patient's current medications.  Current Outpatient Medications  Medication Sig Dispense Refill   atorvastatin (LIPITOR) 20 MG tablet Take 1 tablet (20 mg total) by mouth daily. 90 tablet 1   brexpiprazole (REXULTI) 1 MG TABS tablet Take 0.5 mg daily for one week, then increase to 1 mg daily 35 tablet    CLIMARA PRO 0.045-0.015 MG/DAY Place 1 patch onto the skin once a week.     EQUETRO 200 MG CP12 12 hr capsule TAKE 2  CAPSULES (400 MG TOTAL) BY MOUTH EVERY MORNING AND 3 CAPSULES (600 MG TOTAL) AT BEDTIME. 450 capsule 0   levothyroxine (SYNTHROID) 50 MCG tablet Take 1 tablet (50 mcg total) by mouth daily before breakfast. 90 tablet 1   Multiple Vitamin (MULTIVITAMIN) tablet Take 1 tablet by mouth daily.     topiramate (TOPAMAX) 25 MG tablet Take 25 mg by mouth daily.     valACYclovir (VALTREX) 1000 MG tablet TAKE  1/2 TABLET BY MOUTH DAILY 45 tablet 1   COMBIPATCH 0.05-0.14 MG/DAY Place 1 patch onto the skin 2 (two) times a week.     No current facility-administered medications for this visit.    Medication Side Effects: None  Allergies: No Known Allergies  Past Medical History:  Diagnosis Date   Alcoholism (HCC)    quit 09/23/1999   Allergy    Anemia    ANXIETY 04/21/2009   Qualifier: Diagnosis of   By: Myrtie Hawk, Amy S     Replacing diagnoses that were inactivated after the 05/14/22 regulatory import     Bipolar 1 disorder (HCC)    Borderline personality disorder (HCC)    Family history of heart disease 07/11/2022   Genital herpes 05/29/2016   GERD 04/21/2009   Qualifier: Diagnosis of   By: Monica Becton PA-c, Amy S        Herpes simplex 09/12/2022   daily suppression with valtrex, requests 1000mg  tabs that she cuts in half     Hyperlipidemia 08/19/2017   Hypothyroidism 06/08/2010   Insomnia due to medical condition 04/25/2015   Menopausal hot flushes 04/25/2015   Menorrhagia 09/12/2022   periods 4 days, 2 are heavy changing a pad 2-3 hrs, pt started OCP temporarily and discontinued 04/2015. No period until 2015/12/06, then had 3 day period. Pt will monitor and call with problems. 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 Has traumatic history herself. Hospitalized 04/2015 for depression/grief when close client died of overdose. As of 12-06-2015 doing much better. Will be attending [facility] "Breakthrough" retreat program for self-care.     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   Please see review of systems for further details on the patient's review from today.   Objective:   Physical Exam:  LMP 11/29/2015   Physical Exam Constitutional:      General: She is not in acute distress. Musculoskeletal:        General: No deformity.  Neurological:     Mental Status: She is alert and oriented to person, place, and time.     Coordination: Coordination normal.  Psychiatric:        Attention and Perception: Attention and perception normal. She does not perceive auditory or visual hallucinations.        Mood and Affect: Mood is anxious. Mood is not depressed. Affect is not labile, blunt, angry or inappropriate.        Speech: Speech normal.  Behavior: Behavior is cooperative.        Thought Content: Thought content normal. Thought content is not paranoid or delusional. Thought content does not include homicidal or suicidal ideation. Thought content does not include homicidal or suicidal plan.        Cognition and Memory: Cognition and memory normal.        Judgment: Judgment normal.     Comments: Insight intact Mood is dysphoric     Lab Review:     Component Value Date/Time   NA 134 (L) 07/11/2022 0841   K 4.3 07/11/2022 0841   CL 97 07/11/2022 0841   CO2 30 07/11/2022 0841   GLUCOSE 88 07/11/2022 0841   BUN 19 07/11/2022 0841   CREATININE 0.61 07/11/2022 0841   CREATININE 0.64 05/02/2021 1501   CALCIUM 9.3 07/11/2022 0841   PROT 7.2 10/10/2021 1333   ALBUMIN 4.4 10/10/2021 1333   AST 14 10/10/2021 1333   ALT 18 10/10/2021 1333   ALKPHOS 86 10/10/2021 1333   BILITOT 0.4 10/10/2021 1333   GFRNONAA >90 07/11/2013 1511   GFRAA >90 07/11/2013 1511       Component Value Date/Time   WBC 4.7 10/10/2021 1333   RBC 3.70 (L) 10/10/2021 1333   HGB 12.6 10/10/2021 1333   HCT 36.9 10/10/2021 1333   PLT 244.0 10/10/2021 1333   MCV 99.8 10/10/2021  1333   MCH 32.6 05/02/2021 1501   MCHC 34.0 10/10/2021 1333   RDW 12.3 10/10/2021 1333   LYMPHSABS 1.7 10/10/2021 1333   MONOABS 0.6 10/10/2021 1333   EOSABS 0.1 10/10/2021 1333   BASOSABS 0.0 10/10/2021 1333    No results found for: "POCLITH", "LITHIUM"   Lab Results  Component Value Date   CBMZ 6.7 05/02/2021     .res Assessment: Plan:    32 minutes spent dedicated to the care of this patient on the date of this encounter to include pre-visit review of records, ordering of medication, post visit documentation, and face-to-face time with the patient discussing response to Vraylar, recent manic/mixed episode symptoms, and treatment alternatives. Discussed that Leafy Kindle appears to be a coverage exclusion under new plan and that pt is experiencing worsening mood symptoms since switching to vraylar. Discussed finding an alternative to Vraylar for these reasons. Discussed potential benefits, risks, and side effects of Rexulti. Reviewed potential metabolic side effects associated with atypical antipsychotics, as well as potential risk for movement side effects. Advised pt to contact office if movement side effects occur. Discussed that Rexulti is indicated for adjunct treatment for major depression and Schizophrenia, and that Bipolar disorder is an off-label indication. Pt agrees to trial of Rexulti. Will start Rexulti 0.5 mg daily for one week, then increase to 1 mg daily for mood symptoms. Discussed that dose may need to be further titrated in the future.  Will discontinue Vraylar.  Will continue Equetro 400 mg in the morning and 600 mg at bedtime for mood stabilization.  Pt to follow-up with this provider in 4 weeks or sooner if clinically indicated.  Patient advised to contact office with any questions, adverse effects, or acute worsening in signs and symptoms.     Shir was Decker today for manic behavior.  Diagnoses and all orders for this visit:  Bipolar disorder, current episode  mixed, moderate (HCC) -     brexpiprazole (REXULTI) 1 MG TABS tablet; Take 0.5 mg daily for one week, then increase to 1 mg daily  Insomnia due to other mental disorder  Anxiety disorder,  unspecified type     Please see After Visit Summary for patient specific instructions.  Future Appointments  Date Time Provider Department Center  09/25/2022  8:00 AM [facility] 1 [facility]  10/16/2022 12:30 PM [facility] 1 [facility]  10/18/2022  9:00 AM Corie Chiquito, PMHNP [facility] None  01/15/2023  8:00 AM Sandford Craze, NP [facility]    No orders of the defined types were placed in this encounter.   -------------------------------

## 2022-09-20 NOTE — Patient Instructions (Signed)
Medication Instructions:  Your physician recommends that you continue on your current medications as directed. Please refer to the Current Medication list given to you today.  *If you need a refill on your cardiac medications before your next appointment, please call your pharmacy*   Lab Work: None ordered If you have labs (blood work) drawn today and your tests are completely normal, you will receive your results only by: MyChart Message (if you have MyChart) OR A paper copy in the mail If you have any lab test that is abnormal or we need to change your treatment, we will call you to review the results.   Testing/Procedures: Your physician has requested that you have an echocardiogram. Echocardiography is a painless test that uses sound waves to create images of your heart. It provides your doctor with information about the size and shape of your heart and how well your heart's chambers and valves are working. This procedure takes approximately one hour. There are no restrictions for this procedure. Please do NOT wear cologne, perfume, aftershave, or lotions (deodorant is allowed). Please arrive 15 minutes prior to your appointment time.  We will order CT coronary calcium score. It will cost $99.00 and iis due at time of scan.  Please call to schedule.    [facility] [address] [place], [place] [address] [phone] Located on the first floor.  Follow-Up: At [facility], you and your health needs are our priority.  As part of our continuing mission to provide you with exceptional heart care, we have created designated Provider Care Teams.  These Care Teams include your primary Cardiologist (physician) and Advanced Practice Providers (APPs -  Physician Assistants and Nurse Practitioners) who all work together to provide you with the care you need, when you need it.  We recommend signing up for the patient portal called "MyChart".  Sign up information is provided  on this After Visit Summary.  MyChart is used to connect with patients for Virtual Visits (Telemedicine).  Patients are able to view lab/test results, encounter notes, upcoming appointments, etc.  Non-urgent messages can be sent to your provider as well.   To learn more about what you can do with MyChart, go to [email or web address].    Your next appointment:   12 month(s)  The format for your next appointment:   In Person  Provider:   Belva Crome, MD   Other Instructions Echocardiogram An echocardiogram is a test that uses sound waves (ultrasound) to produce images of the heart. Images from an echocardiogram can provide important information about: Heart size and shape. The size and thickness and movement of your heart's walls. Heart muscle function and strength. Heart valve function or if you have stenosis. Stenosis is when the heart valves are too narrow. If blood is flowing backward through the heart valves (regurgitation). A tumor or infectious growth around the heart valves. Areas of heart muscle that are not working well because of poor blood flow or injury from a heart attack. Aneurysm detection. An aneurysm is a weak or damaged part of an artery wall. The wall bulges out from the normal force of blood pumping through the body. Tell a health care provider about: Any allergies you have. All medicines you are taking, including vitamins, herbs, eye drops, creams, and over-the-counter medicines. Any blood disorders you have. Any surgeries you have had. Any medical conditions you have. Whether you are pregnant or may be pregnant. What are the risks? Generally, this is a safe  test. However, problems may occur, including an allergic reaction to dye (contrast) that may be used during the test. What happens before the test? No specific preparation is needed. You may eat and drink normally. What happens during the test? You will take off your clothes from the waist up and put  on a hospital gown. Electrodes or electrocardiogram (ECG)patches may be placed on your chest. The electrodes or patches are then connected to a device that monitors your heart rate and rhythm. You will lie down on a table for an ultrasound exam. A gel will be applied to your chest to help sound waves pass through your skin. A handheld device, called a transducer, will be pressed against your chest and moved over your heart. The transducer produces sound waves that travel to your heart and bounce back (or "echo" back) to the transducer. These sound waves will be captured in real-time and changed into images of your heart that can be viewed on a video monitor. The images will be recorded on a computer and reviewed by your health care provider. You may be asked to change positions or hold your breath for a short time. This makes it easier to get different views or better views of your heart. In some cases, you may receive contrast through an IV in one of your veins. This can improve the quality of the pictures from your heart. The procedure may vary among health care providers and hospitals.   What can I expect after the test? You may return to your normal, everyday life, including diet, activities, and medicines, unless your health care provider tells you not to do that. Follow these instructions at home: It is up to you to get the results of your test. Ask your health care provider, or the department that is doing the test, when your results will be ready. Keep all follow-up visits. This is important. Summary An echocardiogram is a test that uses sound waves (ultrasound) to produce images of the heart. Images from an echocardiogram can provide important information about the size and shape of your heart, heart muscle function, heart valve function, and other possible heart problems. You do not need to do anything to prepare before this test. You may eat and drink normally. After the echocardiogram is  completed, you may return to your normal, everyday life, unless your health care provider tells you not to do that. This information is not intended to replace advice given to you by your health care provider. Make sure you discuss any questions you have with your health care provider. Document Revised: 09/22/2019 Document Reviewed: 09/22/2019 Elsevier Patient Education  2021 Elsevier Inc.  Coronary Calcium Scan A coronary calcium scan is an imaging test used to look for deposits of plaque in the inner lining of the blood vessels of the heart (coronary arteries). Plaque is made up of calcium, protein, and fatty substances. These deposits of plaque can partly clog and narrow the coronary arteries without producing any symptoms or warning signs. This puts a person at risk for a heart attack. A coronary calcium scan is performed using a computed tomography (CT) scanner machine without using a dye (contrast). This test is recommended for people who are at moderate risk for heart disease. The test can find plaque deposits before symptoms develop. Tell a health care provider about: Any allergies you have. All medicines you are taking, including vitamins, herbs, eye drops, creams, and over-the-counter medicines. Any problems you or family members have had with  anesthetic medicines. Any bleeding problems you have. Any surgeries you have had. Any medical conditions you have. Whether you are pregnant or may be pregnant. What are the risks? Generally, this is a safe procedure. However, problems may occur, including: Harm to a pregnant woman and her unborn baby. This test involves the use of radiation. Radiation exposure can be dangerous to a pregnant woman and her unborn baby. If you are pregnant or think you may be pregnant, you should not have this procedure done. A slight increase in the risk of cancer. This is because of the radiation involved in the test. The amount of radiation from one test is  similar to the amount of radiation you are naturally exposed to over one year. What happens before the procedure? Ask your health care provider for any specific instructions on how to prepare for this procedure. You may be asked to avoid products that contain caffeine, tobacco, or nicotine for 4 hours before the procedure. What happens during the procedure?  You will undress and remove any jewelry from your neck or chest. You may need to remove hearing aides and dentures. Women may need to remove their bras. You will put on a hospital gown. Sticky electrodes will be placed on your chest. The electrodes will be connected to an electrocardiogram (ECG) machine to record a tracing of the electrical activity of your heart. You will lie down on your back on a curved bed that is attached to the CT scanner. You may be given medicine to slow down your heart rate so that clear pictures can be created. You will be moved into the CT scanner, and the CT scanner will take pictures of your heart. During this time, you will be asked to lie still and hold your breath for 10-20 seconds at a time while each picture of your heart is being taken. The procedure may vary among health care providers and hospitals. What can I expect after the procedure? You can return to your normal activities. It is up to you to get the results of your procedure. Ask your health care provider, or the department that is doing the procedure, when your results will be ready. Summary A coronary calcium scan is an imaging test used to look for deposits of plaque in the inner lining of the blood vessels of the heart. Plaque is made up of calcium, protein, and fatty substances. A coronary calcium scan is performed using a CT scanner machine without contrast. Generally, this is a safe procedure. Tell your health care provider if you are pregnant or may be pregnant. Ask your health care provider for any specific instructions on how to prepare for  this procedure. You can return to your normal activities after the scan is done. This information is not intended to replace advice given to you by your health care provider. Make sure you discuss any questions you have with your health care provider. Document Revised: 01/08/2021 Document Reviewed: 01/08/2021 Elsevier Patient Education  2024 Elsevier Inc.  Important Information About Sugar

## 2022-09-23 NOTE — Telephone Encounter (Signed)
Pt not taking Vraylar

## 2022-09-25 ENCOUNTER — Ambulatory Visit (HOSPITAL_BASED_OUTPATIENT_CLINIC_OR_DEPARTMENT_OTHER)
Admission: RE | Admit: 2022-09-25 | Discharge: 2022-09-25 | Disposition: A | Discharge status: Home / Self Care | Payer: 59 | Source: Ambulatory Visit | Attending: Cardiology | Admitting: Cardiology

## 2022-09-25 DIAGNOSIS — Z8249 Family history of ischemic heart disease and other diseases of the circulatory system: Secondary | ICD-10-CM | POA: Insufficient documentation

## 2022-10-02 ENCOUNTER — Telehealth: Payer: Self-pay

## 2022-10-02 NOTE — Telephone Encounter (Signed)
Left vm to call back

## 2022-10-02 NOTE — Telephone Encounter (Signed)
-----   Message from Garwin Brothers sent at 10/02/2022 10:17 AM EDT ----- The results of the study is unremarkable. Please inform patient. I will discuss in detail at next appointment. Cc  primary care/referring physician Garwin Brothers, MD 10/02/2022 10:17 AM

## 2022-10-16 ENCOUNTER — Ambulatory Visit (HOSPITAL_BASED_OUTPATIENT_CLINIC_OR_DEPARTMENT_OTHER)
Admission: RE | Admit: 2022-10-16 | Discharge: 2022-10-16 | Disposition: A | Discharge status: Home / Self Care | Payer: 59 | Source: Ambulatory Visit | Attending: Cardiology | Admitting: Cardiology

## 2022-10-16 DIAGNOSIS — R011 Cardiac murmur, unspecified: Secondary | ICD-10-CM

## 2022-10-17 LAB — ECHOCARDIOGRAM COMPLETE
AR max vel: 2.45 cm2
AV Area VTI: 2.33 cm2
AV Area mean vel: 2.37 cm2
AV Mean grad: 5 mmHg
AV Peak grad: 9.5 mmHg
Ao pk vel: 1.54 m/s
Area-P 1/2: 3.12 cm2
Calc EF: 71.2 %
MV M vel: 3.94 m/s
MV Peak grad: 62.1 mmHg
S' Lateral: 2.9 cm
Single Plane A2C EF: 71.9 %
Single Plane A4C EF: 67.6 %

## 2022-10-18 ENCOUNTER — Ambulatory Visit: Payer: 59 | Admitting: Psychiatry

## 2022-10-18 ENCOUNTER — Other Ambulatory Visit: Payer: Self-pay | Admitting: Psychiatry

## 2022-10-18 ENCOUNTER — Encounter: Payer: Self-pay | Admitting: Psychiatry

## 2022-10-18 DIAGNOSIS — F3177 Bipolar disorder, in partial remission, most recent episode mixed: Secondary | ICD-10-CM

## 2022-10-18 DIAGNOSIS — F99 Mental disorder, not otherwise specified: Secondary | ICD-10-CM | POA: Diagnosis not present

## 2022-10-18 DIAGNOSIS — F5105 Insomnia due to other mental disorder: Secondary | ICD-10-CM | POA: Diagnosis not present

## 2022-10-18 DIAGNOSIS — F419 Anxiety disorder, unspecified: Secondary | ICD-10-CM | POA: Diagnosis not present

## 2022-10-18 DIAGNOSIS — F3162 Bipolar disorder, current episode mixed, moderate: Secondary | ICD-10-CM

## 2022-10-18 MED ORDER — BREXPIPRAZOLE 1 MG PO TABS: 1.0000 mg | ORAL_TABLET | Freq: Every day | ORAL | 2 refills | Status: DC

## 2022-10-18 MED ORDER — EQUETRO 200 MG PO CP12: ORAL_CAPSULE | ORAL | 0 refills | Status: DC

## 2022-10-18 NOTE — Progress Notes (Signed)
Diana Decker [id number] 12/28/1965 57 y.o.  Subjective:   Patient ID:  Diana Decker is a 57 y.o. (DOB [birth year]) female.  Chief Complaint:  Chief Complaint  Patient presents with   Follow-up    Bipolar Disorder, anxiety, and insomnia    HPI [name] presents to the office today for follow-up of Bipolar Disorder, Anxiety, and insomnia.   She reports that her mood is "better." She reports some work-related stress. She reports that she woke up one night at 1 am with anxiety about work. She reports some worry about work. Reports anxiety is specific to work stress. Denies manic symptoms or irritability. She denies impulsivity or risky behavior. Reports that she bought a few items recently- "but it was all stuff I actually needed." She reports some possible mild sad mood. Energy is "not that great." She attributes low energy to sleep disturbance. Feels that hormone patch is less effective towards the end of the week and has more night sweats and hot flashes. Sleep is fragmented. "I feel generally more stable." She reports adequate concentration. Appetite has been slightly lower with starting Semaglutide. Reports some weight loss. Reports occ fleeting suicidal thoughts without intent.   She reports that they are having some staffing issues.   Past Psychiatric Medication Trials: Latuda- Had restless legs on higher doses.  Good response to 60 mg dose Equetro-effective for mood stabilization without visual changes Carbamazepine (generic)-had vision changes and drowsiness Abilify-effective.  Experienced compulsive eating and spending.  Weight gain. Vraylar Saphris Depakote-some weight gain Lithium-took briefly Serzone Trazodone Lamictal-caused insomnia Melatonin  AIMS    Flowsheet Row Office Visit from 10/18/2022 in [facility] Office Visit from 07/05/2022 in [facility] Office Visit from 05/17/2022 in [facility] Visit from 11/16/2021 in [facility] Office Visit from 01/19/2021 in [facility]  AIMS Total Score 2 3 4  0 0      PHQ2-9    Flowsheet Row Office Visit from 01/10/2022 in [facility] Office Visit from 07/01/2017 in [facility] Office Visit from 05/29/2016 in [facility]  PHQ-2 Total Score 0 0 4  PHQ-9 Total Score -- 2 13        Review of Systems:  Review of Systems  Cardiovascular:  Negative for palpitations.  Gastrointestinal:        Mild reflux  Musculoskeletal:  Negative for gait problem.  Neurological:  Negative for tremors.  Psychiatric/Behavioral:         Please refer to HPI    Medications: I have reviewed the patient's current medications.  Current Outpatient Medications  Medication Sig Dispense Refill   atorvastatin (LIPITOR) 20 MG tablet Take 1 tablet (20 mg total) by mouth daily. 90 tablet 1   CLIMARA PRO 0.045-0.015 MG/DAY Place 1 patch onto the skin once a week.     levothyroxine (SYNTHROID) 50 MCG tablet Take 1 tablet (50 mcg total) by mouth daily before breakfast. 90 tablet 1   Multiple Vitamin (MULTIVITAMIN) tablet Take 1 tablet by mouth daily.     Semaglutide-Weight Management 0.25 MG/0.5ML SOAJ Inject 0.25 mg into the skin.     valACYclovir (VALTREX) 1000 MG tablet TAKE 1/2 TABLET BY MOUTH DAILY 45 tablet 1   brexpiprazole (REXULTI) 1 MG TABS tablet Take 1 tablet (1 mg total) by  mouth daily. 30 tablet 2   COMBIPATCH 0.05-0.14 MG/DAY Place 1 patch onto the skin 2 (two) times a week. (Patient not taking: Reported on 10/18/2022)     EQUETRO 200 MG CP12 12 hr capsule TAKE 2 CAPSULES (400 MG TOTAL) BY MOUTH EVERY MORNING AND 3 CAPSULES (600 MG TOTAL) AT BEDTIME. 450 capsule 0   No current facility-administered medications for this visit.    Medication Side  Effects: None  Allergies: No Known Allergies  Past Medical History:  Diagnosis Date   Alcoholism (HCC)    quit 09/23/1999   Allergy    Anemia    ANXIETY 04/21/2009   Qualifier: Diagnosis of   By: Myrtie Hawk, Amy S     Replacing diagnoses that were inactivated after the 05/14/22 regulatory import     Bipolar 1 disorder (HCC)    Borderline personality disorder (HCC)    Family history of heart disease 07/11/2022   Genital herpes 05/29/2016   GERD 04/21/2009   Qualifier: Diagnosis of   By: Monica Becton PA-c, Amy S        Herpes simplex 09/12/2022   daily suppression with valtrex, requests 1000mg  tabs that she cuts in half     Hyperlipidemia 08/19/2017   Hypothyroidism 06/08/2010   Insomnia due to medical condition 04/25/2015   Menopausal hot flushes 04/25/2015   Menorrhagia 09/12/2022   periods 4 days, 2 are heavy changing a pad 2-3 hrs, pt started OCP temporarily and discontinued 04/2015. No period until 12-26-15, then had 3 day period. Pt will monitor and call with problems. 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 Has traumatic history herself. Hospitalized 04/2015 for depression/grief when close client died of overdose. As of 2015/12/26 doing much better. Will be attending [facility] "Breakthrough" retreat program for self-care.     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   Please see review of systems for further details on the patient's review from today.   Objective:    Physical Exam:  LMP 11/29/2015   Physical Exam Constitutional:      General: She is not in acute distress. Musculoskeletal:        General: No deformity.  Neurological:     Mental Status: She is alert and oriented to person, place, and time.     Coordination: Coordination normal.  Psychiatric:        Attention and Perception: Attention and perception normal. She does not perceive auditory or visual hallucinations.        Mood and Affect: Mood is not depressed. Affect is not labile, blunt, angry or inappropriate.        Speech: Speech normal.        Behavior: Behavior normal.        Thought Content: Thought content normal. Thought content is not paranoid or delusional. Thought content does not include homicidal or suicidal ideation. Thought content does not include homicidal or suicidal plan.  Cognition and Memory: Cognition and memory normal.        Judgment: Judgment normal.     Comments: Insight intact Mood is mildly anxious in response to work stressors.      Lab Review:     Component Value Date/Time   NA 134 (L) 07/11/2022 0841   K 4.3 07/11/2022 0841   CL 97 07/11/2022 0841   CO2 30 07/11/2022 0841   GLUCOSE 88 07/11/2022 0841   BUN 19 07/11/2022 0841   CREATININE 0.61 07/11/2022 0841   CREATININE 0.64 05/02/2021 1501   CALCIUM 9.3 07/11/2022 0841   PROT 7.2 10/10/2021 1333   ALBUMIN 4.4 10/10/2021 1333   AST 14 10/10/2021 1333   ALT 18 10/10/2021 1333   ALKPHOS 86 10/10/2021 1333   BILITOT 0.4 10/10/2021 1333   GFRNONAA >90 07/11/2013 1511   GFRAA >90 07/11/2013 1511       Component Value Date/Time   WBC 4.7 10/10/2021 1333   RBC 3.70 (L) 10/10/2021 1333   HGB 12.6 10/10/2021 1333   HCT 36.9 10/10/2021 1333   PLT 244.0 10/10/2021 1333   MCV 99.8 10/10/2021 1333   MCH 32.6 05/02/2021 1501   MCHC 34.0 10/10/2021 1333   RDW 12.3 10/10/2021 1333   LYMPHSABS 1.7 10/10/2021 1333   MONOABS 0.6 10/10/2021 1333   EOSABS 0.1 10/10/2021 1333   BASOSABS  0.0 10/10/2021 1333    No results found for: "POCLITH", "LITHIUM"   Lab Results  Component Value Date   CBMZ 6.7 05/02/2021     .res Assessment: Plan:    31 minutes spent dedicated to the care of this patient on the date of this encounter to include pre-visit review of records, ordering of medication, post visit documentation, and face-to-face time with the patient discussing response to Rexulti, Rexulti savings card, and that insurance will likely require PA. Provided pt with additional samples to avoid lapse in treatment while awaiting PA approval. Discussed continuing Rexulti 1 mg dose since she is tolerating this dose without side effects and mood and anxiety symptoms are improved overall. Pt is in agreement with this plan.  Will continue Equetro 400 mg in the morning and 600 mg at bedtime for mood stabilization.  Recommend continuing psychotherapy.  Pt to follow-up in 2 months or sooner if clinically indicated.  Patient advised to contact office with any questions, adverse effects, or acute worsening in signs and symptoms.   Brynlea was seen today for follow-up.  Diagnoses and all orders for this visit:  Bipolar disorder, in partial remission, most recent episode mixed (HCC) -     brexpiprazole (REXULTI) 1 MG TABS tablet; Take 1 tablet (1 mg total) by mouth daily. -     EQUETRO 200 MG CP12 12 hr capsule; TAKE 2 CAPSULES (400 MG TOTAL) BY MOUTH EVERY MORNING AND 3 CAPSULES (600 MG TOTAL) AT BEDTIME.  Anxiety disorder, unspecified type  Insomnia due to other mental disorder     Please see After Visit Summary for patient specific instructions.  Future Appointments  Date Time Provider Department Center  12/18/2022  8:30 AM Corie Chiquito, PMHNP [facility] None  01/15/2023  8:00 AM Sandford Craze, NP [facility]    No orders of the defined types were placed in this encounter.   -------------------------------

## 2022-10-18 NOTE — Telephone Encounter (Signed)
Rexulti not covered.

## 2022-11-06 ENCOUNTER — Telehealth: Payer: Self-pay | Admitting: Psychiatry

## 2022-11-06 NOTE — Telephone Encounter (Signed)
?   If PA is needed. There is not anything in CMM.

## 2022-11-06 NOTE — Telephone Encounter (Signed)
Next visit is 12/18/22. Diana Decker called and got a message from   [facility] [phone] - [place], [place] - [address] AT [address] [facility] [address]   Phone: [phone]  Fax: [phone]   The pharmacy told her that she needs to find an alternative for Rexulti. She was given some samples of Rexulti but is about to run out. Please call her at [phone].

## 2022-11-07 ENCOUNTER — Telehealth: Payer: Self-pay | Admitting: Psychiatry

## 2022-11-07 NOTE — Telephone Encounter (Signed)
Called pt, LVM to CB.

## 2022-11-07 NOTE — Telephone Encounter (Signed)
[facility] [employer] approved REXULTI 1mg  from 11/06/22-11/06/23

## 2022-11-07 NOTE — Telephone Encounter (Signed)
See other phone message. Rexulti 1 mg has been approved for one year through 10/17/2023 with [employer].

## 2022-11-07 NOTE — Telephone Encounter (Signed)
PA was in [facility] and approved effective 11/06/2022-11/06/2023 for Rexulti 1 mg with [employer]

## 2022-11-07 NOTE — Telephone Encounter (Signed)
Please contact pt to let her know her prior authorization has been approved for Rexulti 1 mg and she can get it filled if she hasn't already.

## 2022-11-07 NOTE — Telephone Encounter (Signed)
PA has been approved with [employer]

## 2022-11-07 NOTE — Telephone Encounter (Signed)
Called pt, lvm to cb

## 2022-12-18 ENCOUNTER — Ambulatory Visit (INDEPENDENT_AMBULATORY_CARE_PROVIDER_SITE_OTHER): Payer: 59 | Admitting: Psychiatry

## 2022-12-18 ENCOUNTER — Encounter: Payer: Self-pay | Admitting: Psychiatry

## 2022-12-18 DIAGNOSIS — F3162 Bipolar disorder, current episode mixed, moderate: Secondary | ICD-10-CM

## 2022-12-18 DIAGNOSIS — F3177 Bipolar disorder, in partial remission, most recent episode mixed: Secondary | ICD-10-CM

## 2022-12-18 DIAGNOSIS — Z79899 Other long term (current) drug therapy: Secondary | ICD-10-CM | POA: Diagnosis not present

## 2022-12-18 DIAGNOSIS — F419 Anxiety disorder, unspecified: Secondary | ICD-10-CM

## 2022-12-18 MED ORDER — BUSPIRONE HCL 15 MG PO TABS: ORAL_TABLET | ORAL | 2 refills | Status: DC

## 2022-12-18 MED ORDER — BREXPIPRAZOLE 1 MG PO TABS: 1.0000 mg | ORAL_TABLET | Freq: Every day | ORAL | 1 refills | Status: DC

## 2022-12-18 MED ORDER — EQUETRO 200 MG PO CP12: ORAL_CAPSULE | ORAL | 0 refills | Status: DC

## 2022-12-18 NOTE — Progress Notes (Unsigned)
Diana Decker [id number] 10/07/1965 57 y.o.  Subjective:   Patient ID:  Diana Decker is a 57 y.o. (DOB May 22, 1965) female.  Chief Complaint:  Chief Complaint  Patient presents with   Anxiety    Anxiety     Diana Decker presents to the office today for follow-up of Bipolar Disorder, anxiety, and sleep disturbance. She reports that work has been causing her increased stress and she is trying to manage this, particularly in front of staff. She reports some challenges with certain employees. She reports worry and rumination about work situation. Denies any panic or physical symptoms.   She reports that her mood is "ok, but not great." She reports that work stress is "discouraging... I feel like I am not doing a good job." Reports taking Semaglutide and is not losing weight, "and this is discouraging." She reports losing 6 lbs in 3 months. She reports that her eating "has not been great" and has not been closely following a diet plan. She plans to change weight loss providers. She reports gaining 20 lbs of the 60 lbs that she lost. She reports some irritability at work. Denies any manic s/s. Denies excessive spending. She reports that she returns things that she buys that do not fit or work for her. Energy is "not that great." She reports disrupted sleep since changing from Combi patch to Climara pro. She reports waking up feeling hot during the night and this disrupts her sleep. She is occasionally having to wake up and go sleep somewhere cooler. She questions her overall sleep quality. She reports that she is able "to get a lot done, but not everything done." Motivation has been good. She reports some passive suicidal thoughts. Denies suicidal ideation or intent.    Past Psychiatric Medication Trials: Latuda- Had restless legs on higher doses.  Good response to 60 mg dose Equetro-effective for mood stabilization without visual changes Carbamazepine (generic)-had vision changes and  drowsiness Abilify-effective.  Experienced compulsive eating and spending.  Weight gain. Vraylar Saphris Depakote-some weight gain Lithium-took briefly Serzone Trazodone Lamictal-caused insomnia Melatonin   AIMS    Flowsheet Row Office Visit from 12/18/2022 in [facility] Office Visit from 10/18/2022 in [facility] Office Visit from 07/05/2022 in [facility] Office Visit from 05/17/2022 in [facility] Office Visit from 11/16/2021 in [facility]  AIMS Total Score 0 2 3 4  0      PHQ2-9    Flowsheet Row Office Visit from 01/10/2022 in [facility] Office Visit from 07/01/2017 in [facility] at [facility] Office Visit from 05/29/2016 in [facility] at [facility]  PHQ-2 Total Score 0 0 4  PHQ-9 Total Score -- 2 13        Review of Systems:  Review of Systems  Musculoskeletal:  Negative for gait problem.  Neurological:  Negative for tremors.  Psychiatric/Behavioral:         Please refer to HPI    Medications: I have reviewed the patient's current medications.  Current Outpatient Medications  Medication Sig Dispense Refill   atorvastatin (LIPITOR) 20 MG tablet Take 1 tablet (20 mg total) by mouth daily. 90 tablet 1   busPIRone (BUSPAR) 15 MG tablet Take 1/3 tablet p.o. twice daily for 1 week, then take 2/3 tablet p.o. twice daily for 1 week, then take 1  tablet p.o. twice daily 60 tablet 2   CLIMARA PRO 0.045-0.015 MG/DAY Place 1 patch onto the skin once a week.     levothyroxine (SYNTHROID) 50 MCG tablet Take 1 tablet (50 mcg total) by mouth daily before breakfast. 90 tablet 1   Multiple Vitamin (MULTIVITAMIN) tablet Take 1 tablet by mouth daily.     Semaglutide-Weight Management 1 MG/0.5ML SOAJ Inject 1 mg into the skin.     valACYclovir  (VALTREX) 1000 MG tablet TAKE 1/2 TABLET BY MOUTH DAILY 45 tablet 1   brexpiprazole (REXULTI) 1 MG TABS tablet Take 1 tablet (1 mg total) by mouth daily. 90 tablet 1   COMBIPATCH 0.05-0.14 MG/DAY Place 1 patch onto the skin 2 (two) times a week. (Patient not taking: Reported on 10/18/2022)     EQUETRO 200 MG CP12 12 hr capsule TAKE 2 CAPSULES (400 MG TOTAL) BY MOUTH EVERY MORNING AND 3 CAPSULES (600 MG TOTAL) AT BEDTIME. 450 capsule 0   No current facility-administered medications for this visit.    Medication Side Effects: Other: GI side effects with Semaglutide  Allergies: No Known Allergies  Past Medical History:  Diagnosis Date   Alcoholism (HCC)    quit 09/23/1999   Allergy    Anemia    ANXIETY 04/21/2009   Qualifier: Diagnosis of   By: Myrtie Hawk, Amy S     Replacing diagnoses that were inactivated after the 05/14/22 regulatory import     Bipolar 1 disorder (HCC)    Borderline personality disorder (HCC)    Family history of heart disease 07/11/2022   Genital herpes 05/29/2016   GERD 04/21/2009   Qualifier: Diagnosis of   By: Monica Becton PA-c, Amy S        Herpes simplex 09/12/2022   daily suppression with valtrex, requests 1000mg  tabs that she cuts in half     Hyperlipidemia 08/19/2017   Hypothyroidism 06/08/2010   Insomnia due to medical condition 04/25/2015   Menopausal hot flushes 04/25/2015   Menorrhagia 09/12/2022   periods 4 days, 2 are heavy changing a pad 2-3 hrs, pt started OCP temporarily and discontinued 04/2015. No period until 12/01/15, then had 3 day period. Pt will monitor and call with problems. 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 Has traumatic history herself. Hospitalized 04/2015 for depression/grief when close client died of overdose. As of 2015-12-01 doing much better. Will be attending [facility] "Breakthrough" retreat program for  self-care.     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   Please see review of systems for further details on the patient's review from today.   Objective:   Physical Exam:  LMP 11/29/2015   Physical Exam Constitutional:      General: She is not in acute distress. Musculoskeletal:        General: No deformity.  Neurological:     Mental Status: She is alert and oriented to person, place, and time.  Coordination: Coordination normal.  Psychiatric:        Attention and Perception: Attention and perception normal. She does not perceive auditory or visual hallucinations.        Mood and Affect: Mood is anxious. Mood is not depressed. Affect is not labile, blunt, angry or inappropriate.        Speech: Speech normal.        Behavior: Behavior normal.        Thought Content: Thought content normal. Thought content is not paranoid or delusional. Thought content does not include homicidal or suicidal ideation. Thought content does not include homicidal or suicidal plan.        Cognition and Memory: Cognition and memory normal.        Judgment: Judgment normal.     Comments: Insight intact     Lab Review:     Component Value Date/Time   NA 134 (L) 07/11/2022 0841   K 4.3 07/11/2022 0841   CL 97 07/11/2022 0841   CO2 30 07/11/2022 0841   GLUCOSE 88 07/11/2022 0841   BUN 19 07/11/2022 0841   CREATININE 0.61 07/11/2022 0841   CREATININE 0.64 05/02/2021 1501   CALCIUM 9.3 07/11/2022 0841   PROT 7.2 10/10/2021 1333   ALBUMIN 4.4 10/10/2021 1333   AST 14 10/10/2021 1333   ALT 18 10/10/2021 1333    ALKPHOS 86 10/10/2021 1333   BILITOT 0.4 10/10/2021 1333   GFRNONAA >90 07/11/2013 1511   GFRAA >90 07/11/2013 1511       Component Value Date/Time   WBC 4.7 10/10/2021 1333   RBC 3.70 (L) 10/10/2021 1333   HGB 12.6 10/10/2021 1333   HCT 36.9 10/10/2021 1333   PLT 244.0 10/10/2021 1333   MCV 99.8 10/10/2021 1333   MCH 32.6 05/02/2021 1501   MCHC 34.0 10/10/2021 1333   RDW 12.3 10/10/2021 1333   LYMPHSABS 1.7 10/10/2021 1333   MONOABS 0.6 10/10/2021 1333   EOSABS 0.1 10/10/2021 1333   BASOSABS 0.0 10/10/2021 1333    No results found for: "POCLITH", "LITHIUM"   Lab Results  Component Value Date   CBMZ 6.7 05/02/2021     .res Assessment: Plan:    35 minutes spent dedicated to the care of this patient on the date of this encounter to include pre-visit review of records, ordering of medication, post visit documentation, and face-to-face time with the patient discussing treatment options for anxiety. Discussed potential benefits, risks, and side effects of BuSpar.  Patient agrees to trial of BuSpar.  Will start BuSpar 15 mg 1/3 tablet twice daily for 1 week, then increase to 2/3 tablet twice daily for 1 week, then increase to 1 tablet twice daily for anxiety. Will continue Rexulti 1 mg po every day for mood symptoms. Discussed option to increase Rexulti to 2 mg daily to better control mood symptoms and discussed that increased dose in Rexulti may increase likelihood of TD, which has been improved recently and is now infrequent. Pt reports that she prefers to continue Rexulti 1 mg daily and determine response to Buspar.  Continue Equetro 400 mg in the morning and 600 mg at bedtime for mood stabilization.  Discussed obtaining labs to include CBC, CMP, and Carbamazepine level to determine current drug level and to monitor for possible adverse effects.  Recommend continuing psychotherapy.  Pt to follow-up with this provider in 6 weeks or sooner if clinically indicated.  Patient  advised to contact office with any questions, adverse effects, or acute worsening in  signs and symptoms.   Diana Decker was Decker today for anxiety.  Diagnoses and all orders for this visit:  Anxiety disorder, unspecified type -     busPIRone (BUSPAR) 15 MG tablet; Take 1/3 tablet p.o. twice daily for 1 week, then take 2/3 tablet p.o. twice daily for 1 week, then take 1 tablet p.o. twice daily  High risk medication use -     CBC with Differential/Platelet -     Comprehensive metabolic panel -     Carbamazepine level, total  Bipolar disorder, in partial remission, most recent episode mixed (HCC) -     EQUETRO 200 MG CP12 12 hr capsule; TAKE 2 CAPSULES (400 MG TOTAL) BY MOUTH EVERY MORNING AND 3 CAPSULES (600 MG TOTAL) AT BEDTIME. -     brexpiprazole (REXULTI) 1 MG TABS tablet; Take 1 tablet (1 mg total) by mouth daily.     Please see After Visit Summary for patient specific instructions.  Future Appointments  Date Time Provider Department Center  01/15/2023  8:00 AM Sandford Craze, NP [facility]  01/29/2023  9:00 AM Corie Chiquito, PMHNP [facility] None    Orders Placed This Encounter  Procedures   CBC with Differential/Platelet   Comprehensive metabolic panel   Carbamazepine level, total    -------------------------------

## 2022-12-19 ENCOUNTER — Encounter: Payer: Self-pay | Admitting: Family

## 2022-12-26 ENCOUNTER — Encounter: Payer: Self-pay | Admitting: Psychiatry

## 2022-12-29 LAB — CBC WITH DIFFERENTIAL/PLATELET
Absolute Lymphocytes: 1771 {cells}/uL (ref 850–3900)
Absolute Monocytes: 432 {cells}/uL (ref 200–950)
Basophils Absolute: 41 {cells}/uL (ref 0–200)
Basophils Relative: 0.9 %
Eosinophils Absolute: 193 {cells}/uL (ref 15–500)
Eosinophils Relative: 4.2 %
HCT: 41.2 % (ref 35.0–45.0)
Hemoglobin: 13.6 g/dL (ref 11.7–15.5)
MCH: 33.3 pg — ABNORMAL HIGH (ref 27.0–33.0)
MCHC: 33 g/dL (ref 32.0–36.0)
MCV: 101 fL — ABNORMAL HIGH (ref 80.0–100.0)
MPV: 10.2 fL (ref 7.5–12.5)
Monocytes Relative: 9.4 %
Neutro Abs: 2162 {cells}/uL (ref 1500–7800)
Neutrophils Relative %: 47 %
Platelets: 262 10*3/uL (ref 140–400)
RBC: 4.08 10*6/uL (ref 3.80–5.10)
RDW: 11.8 % (ref 11.0–15.0)
Total Lymphocyte: 38.5 %
WBC: 4.6 10*3/uL (ref 3.8–10.8)

## 2022-12-29 LAB — COMPREHENSIVE METABOLIC PANEL
AG Ratio: 1.6 (calc) (ref 1.0–2.5)
ALT: 15 U/L (ref 6–29)
AST: 14 U/L (ref 10–35)
Albumin: 4.5 g/dL (ref 3.6–5.1)
Alkaline phosphatase (APISO): 88 U/L (ref 37–153)
BUN: 18 mg/dL (ref 7–25)
CO2: 29 mmol/L (ref 20–32)
Calcium: 9.5 mg/dL (ref 8.6–10.4)
Chloride: 102 mmol/L (ref 98–110)
Creat: 0.65 mg/dL (ref 0.50–1.03)
Globulin: 2.8 g/dL (ref 1.9–3.7)
Glucose, Bld: 97 mg/dL (ref 65–99)
Potassium: 5 mmol/L (ref 3.5–5.3)
Sodium: 135 mmol/L (ref 135–146)
Total Bilirubin: 0.3 mg/dL (ref 0.2–1.2)
Total Protein: 7.3 g/dL (ref 6.1–8.1)

## 2022-12-29 LAB — CARBAMAZEPINE LEVEL, TOTAL: Carbamazepine Lvl: 8 mg/L (ref 4.0–12.0)

## 2023-01-05 ENCOUNTER — Other Ambulatory Visit: Payer: Self-pay | Admitting: Family

## 2023-01-10 ENCOUNTER — Other Ambulatory Visit: Payer: Self-pay | Admitting: Psychiatry

## 2023-01-10 DIAGNOSIS — F419 Anxiety disorder, unspecified: Secondary | ICD-10-CM

## 2023-01-15 ENCOUNTER — Ambulatory Visit: Payer: Managed Care, Other (non HMO) | Admitting: Family

## 2023-01-16 ENCOUNTER — Ambulatory Visit: Payer: Managed Care, Other (non HMO) | Admitting: Family

## 2023-01-25 ENCOUNTER — Ambulatory Visit (INDEPENDENT_AMBULATORY_CARE_PROVIDER_SITE_OTHER): Payer: 59 | Admitting: Family

## 2023-01-25 VITALS — BP 117/77 | HR 61 | Temp 97.7°F | Resp 16 | Ht 67.0 in | Wt 213.0 lb

## 2023-01-25 DIAGNOSIS — E039 Hypothyroidism, unspecified: Secondary | ICD-10-CM | POA: Diagnosis not present

## 2023-01-25 DIAGNOSIS — F319 Bipolar disorder, unspecified: Secondary | ICD-10-CM

## 2023-01-25 DIAGNOSIS — K219 Gastro-esophageal reflux disease without esophagitis: Secondary | ICD-10-CM | POA: Diagnosis not present

## 2023-01-25 LAB — TSH: TSH: 3.05 u[IU]/mL (ref 0.35–5.50)

## 2023-01-25 NOTE — Patient Instructions (Signed)
VISIT SUMMARY:  During today's visit, we reviewed your recent cardiac ultrasound and CT results, which were normal. We also discussed your current medications and management plans for cholesterol, reflux, and weight management. Additionally, we addressed the upcoming changes in your insurance and the need to switch healthcare providers.  YOUR PLAN:  -CARDIOVASCULAR HEALTH: Your recent cardiac ultrasound and CT scan showed normal heart function and no calcium buildup in your arteries. This means your heart is in good health. We will continue with your current management plan and schedule a follow-up with cardiology in a year.  -OBESITY: We discussed your dissatisfaction with your previous provider's approach to weight management. You are currently using semaglutide from a compounding pharmacy associated with [facility] and are satisfied with the support you are receiving. We will continue with your current management plan.  -HYPOTHYROIDISM: Hypothyroidism is a condition where your thyroid gland does not produce enough thyroid hormone. Your last thyroid function test in May was normal. We will check your thyroid function again today to ensure it remains stable.  -GASTROESOPHAGEAL REFLUX DISEASE (GERD): GERD is a condition where stomach acid frequently flows back into the tube connecting your mouth and stomach. Your reflux is well-controlled with over-the-counter Nexium. We will continue with your current management plan.  -PREVENTIVE CARE: Preventive care is important for early detection of potential health issues. You are due for a mammogram, and we need to check the status of your Pap smear. Please schedule a mammogram with your OBGYN and check the status of your Pap smear with them as well.  -INSURANCE CHANGE: You mentioned that your insurance is changing due to your husband's retirement and your job not offering insurance. This will require you to switch healthcare providers. If your insurance  situation changes in the future, please feel free to return for further assistance.  INSTRUCTIONS:  Please schedule a mammogram with your OBGYN and check the status of your Pap smear with them. We will also check your thyroid function today. Continue with your current medications and management plans. Follow up with cardiology in a year.

## 2023-01-25 NOTE — Progress Notes (Signed)
Subjective:     Patient ID: Diana Decker, female    DOB: [birth year], 57 y.o.   MRN: [id number]  Chief Complaint  Patient presents with   Hypothyroidism    Here for follow up    HPI  Discussed the use of AI scribe software for clinical note transcription with the patient, who gave verbal consent to proceed.  History of Present Illness   The patient, with a history of cardiovascular concerns and weight management issues, recently underwent an ultrasound of the heart and a cardiac CT. The results were favorable with normal heart function, good valve health, and a calcium score of zero. Despite these positive results, the patient expressed dissatisfaction with her cardiologist's approach, citing a lack of sensitivity regarding her weight.  The patient is currently on Lipitor 20 mg for cholesterol management, which has shown good results. She also takes Nexium for reflux, which is well-controlled. For weight management, she is on semaglutide, obtained through a compounding pharmacy associated with [facility]. She reports satisfaction with this service, noting good support and advice for managing side effects.  The patient also mentions a change in insurance, which will require her to switch all her healthcare providers. 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 She expresses mixed feelings about this change, noting the challenges of finding providers who accept the new insurance.     Wt Readings from Last 3 Encounters:  01/25/23 213 lb (96.6 kg)  09/20/22 208 lb 1.3 oz (94.4 kg)  07/11/22 199 lb (90.3 kg)       Health Maintenance Due  Topic Date Due   COVID-19 Vaccine (6 - 2024-25 season) 10/14/2022   Cervical Cancer Screening (HPV/Pap Cotest)  10/23/2022    Past Medical History:  Diagnosis Date   Alcoholism (HCC)    quit 09/23/1999   Allergy    Anemia    ANXIETY 04/21/2009   Qualifier: Diagnosis of   By: Myrtie Hawk, Amy  S     Replacing diagnoses that were inactivated after the 05/14/22 regulatory import     Bipolar 1 disorder (HCC)    Borderline personality disorder (HCC)    Family history of heart disease 07/11/2022   Genital herpes 05/29/2016   GERD 04/21/2009   Qualifier: Diagnosis of   By: Monica Becton PA-c, Amy S        Herpes simplex 09/12/2022   daily suppression with valtrex, requests 1000mg  tabs that she cuts in half     Hyperlipidemia 08/19/2017   Hypothyroidism 06/08/2010   Insomnia due to medical condition 04/25/2015   Menopausal hot flushes 04/25/2015   Menorrhagia 09/12/2022   periods 4 days, 2 are heavy changing a pad 2-3 hrs, pt started OCP temporarily and discontinued 04/2015. No period until 2015-12-30, then had 3 day period. Pt will monitor and call with problems. 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 Has traumatic history herself. Hospitalized 04/2015 for depression/grief when close client died of overdose. As of Dec 30, 2015 doing much better. Will be attending [facility] "Breakthrough" retreat program for self-care.     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, cancerous lesion removed   Syncope 10/10/2021    Past Surgical History:  Procedure Laterality Date   APPENDECTOMY     COLONOSCOPY  05/03/2009   Normal - [facility]   EYE SURGERY Left    moh's procedure left eyelid   FOOT SURGERY Right    HERNIA REPAIR     inguinal   SQUAMOUS CELL CARCINOMA EXCISION Left    Mohs procedure   UPPER GASTROINTESTINAL ENDOSCOPY  05/03/2009   [facility]   WISDOM TOOTH EXTRACTION      Family History  Problem Relation Age of Onset   Breast cancer Mother     Depression Mother    Prostate cancer Father    Heart disease Father    Alcohol abuse Father    Depression Father    Anxiety disorder Father    Heart attack Father    Heart disease Brother    Pancreatic cancer Brother    Depression Maternal Uncle    Alcohol abuse Maternal Uncle    Suicidality Maternal Uncle    Cancer Maternal Grandfather        lung?   Aortic aneurysm Half-Brother    Aortic aneurysm Half-Sister    Alcohol abuse Half-Sister    Bipolar disorder Half-Sister    Obesity Half-Sister    Diabetes type II Half-Sister    Breast cancer Niece    Colon cancer Neg Hx    Rectal cancer Neg Hx    Stomach cancer Neg Hx     Social History   Socioeconomic History   Marital status: Married    Spouse name: Not on file   Number of children: Not on file   Years of education: Not on file   Highest education level: Master's degree (e.g., MA, MS, MEng, MEd, MSW, MBA)  Occupational History   Occupation: Horticulturist, commercial  Tobacco Use   Smoking status: Never   Smokeless tobacco: Never  Vaping Use   Vaping status: Never Used  Substance and Sexual Activity   Alcohol use: No   Drug use: No   Sexual activity: Yes    Birth control/protection: None    Comment: husband with vasectomy  Other Topics Concern   Not on file  Social History Narrative   Regular exercise-yes   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    Social Drivers of Corporate investment banker Strain: Low Risk  (07/10/2022)   Overall Financial Resource Strain (CARDIA)    Difficulty of Paying Living Expenses: Not hard at all  Food Insecurity: No Food Insecurity (07/10/2022)   Hunger Vital Sign    Worried About Running Out of Food in the Last Year: Never true    Ran Out of Food in the Last Year: Never true  Transportation Needs: No Transportation Needs (07/10/2022)   PRAPARE - Administrator, Civil Service (Medical): No    Lack of Transportation  (Non-Medical): No  Physical Activity: Insufficiently Active (07/10/2022)   Exercise Vital Sign    Days of Exercise per Week: 4 days    Minutes of Exercise per Session: 30 min  Stress: Stress Concern Present (07/10/2022)   [employer] of Occupational Health - Occupational Stress Questionnaire    Feeling of Stress : To some extent  Social Connections: Moderately Isolated (07/10/2022)   Social Connection and Isolation Panel [NHANES]    Frequency of Communication with Friends and Family: More than three times  a week    Frequency of Social Gatherings with Friends and Family: More than three times a week    Attends Religious Services: Never    Database administrator or Organizations: No    Attends Engineer, structural: Not on file    Marital Status: Married  Catering manager Violence: Not on file    Outpatient Medications Prior to Visit  Medication Sig Dispense Refill   atorvastatin (LIPITOR) 20 MG tablet Take 1 tablet (20 mg total) by mouth daily. 90 tablet 1   brexpiprazole (REXULTI) 1 MG TABS tablet Take 1 tablet (1 mg total) by mouth daily. 90 tablet 1   busPIRone (BUSPAR) 15 MG tablet TAKE 1 TABLET TWICE DAILY 180 tablet 0   CLIMARA PRO 0.045-0.015 MG/DAY Place 1 patch onto the skin once a week.     EQUETRO 200 MG CP12 12 hr capsule TAKE 2 CAPSULES (400 MG TOTAL) BY MOUTH EVERY MORNING AND 3 CAPSULES (600 MG TOTAL) AT BEDTIME. 450 capsule 0   levothyroxine (SYNTHROID) 50 MCG tablet TAKE 1 TABLET BY MOUTH DAILY BEFORE BREAKFAST 90 tablet 1   Multiple Vitamin (MULTIVITAMIN) tablet Take 1 tablet by mouth daily.     Semaglutide-Weight Management 1 MG/0.5ML SOAJ Inject 1 mg into the skin.     valACYclovir (VALTREX) 1000 MG tablet TAKE 1/2 TABLET BY MOUTH DAILY 45 tablet 1   COMBIPATCH 0.05-0.14 MG/DAY Place 1 patch onto the skin 2 (two) times a week.     No facility-administered medications prior to visit.    No Known Allergies  ROS    See HPI Objective:    Physical  Exam Constitutional:      General: She is not in acute distress.    Appearance: Normal appearance. She is well-developed.  HENT:     Head: Normocephalic and atraumatic.     Right Ear: External ear normal.     Left Ear: External ear normal.  Eyes:     General: No scleral icterus. Neck:     Thyroid: No thyromegaly.  Cardiovascular:     Rate and Rhythm: Normal rate and regular rhythm.     Heart sounds: Normal heart sounds. No murmur heard. Pulmonary:     Effort: Pulmonary effort is normal. No respiratory distress.     Breath sounds: Normal breath sounds. No wheezing.  Musculoskeletal:     Cervical back: Neck supple.  Skin:    General: Skin is warm and dry.  Neurological:     Mental Status: She is alert and oriented to person, place, and time.  Psychiatric:        Mood and Affect: Mood normal.        Behavior: Behavior normal.        Thought Content: Thought content normal.        Judgment: Judgment normal.      BP 117/77 (BP Location: Right Arm, Patient Position: Sitting, Cuff Size: Large)   Pulse 61   Temp 97.7 F (36.5 C) (Oral)   Resp 16   Ht 5\' 7"  (1.702 m)   Wt 213 lb (96.6 kg)   LMP 11/29/2015   SpO2 100%   BMI 33.36 kg/m  Wt Readings from Last 3 Encounters:  01/25/23 213 lb (96.6 kg)  09/20/22 208 lb 1.3 oz (94.4 kg)  07/11/22 199 lb (90.3 kg)       Assessment & Plan:   Problem List Items Addressed This Visit       Unprioritized   Hypothyroidism -  Primary (Chronic)   Lab Results  Component Value Date   TSH 1.85 07/11/2022    Last thyroid function test in May was normal. -Check thyroid function test today. -continue synthroid 50 mcg.      Relevant Orders   TSH   Bipolar 1 disorder (HCC) (Chronic)   Reports stable mood on current regimen. Management per psychiatry.      GERD   Controlled with over-the-counter Nexium. -Continue current management.       Preventive Care Mammogram due, Pap smear status unclear. -Schedule mammogram with  OBGYN. -Check status of Pap smear with OBGYN.   Insurance Change Patient's insurance is changing, requiring a change in primary care providers. -Encouraged patient she is welcome to return in the future if her insurance situation changes.  I have discontinued Marikay Alar Yow's CombiPatch. I am also having her maintain her multivitamin, valACYclovir, atorvastatin, Climara Pro, Semaglutide-Weight Management, Equetro, brexpiprazole, levothyroxine, and busPIRone.  No orders of the defined types were placed in this encounter.

## 2023-01-25 NOTE — Assessment & Plan Note (Addendum)
Lab Results  Component Value Date   TSH 1.85 07/11/2022    Last thyroid function test in May was normal. -Check thyroid function test today. -continue synthroid 50 mcg.

## 2023-01-25 NOTE — Assessment & Plan Note (Signed)
Reports stable mood on current regimen. Management per psychiatry.

## 2023-01-25 NOTE — Assessment & Plan Note (Signed)
Controlled with over-the-counter Nexium. -Continue current management.

## 2023-01-25 NOTE — Assessment & Plan Note (Signed)
Lab Results  Component Value Date   CHOL 184 07/11/2022   HDL 68.30 07/11/2022   LDLCALC 96 07/11/2022   LDLDIRECT 191.0 04/24/2019   TRIG 96.0 07/11/2022   CHOLHDL 3 07/11/2022

## 2023-01-29 ENCOUNTER — Encounter: Payer: Self-pay | Admitting: Psychiatry

## 2023-01-29 ENCOUNTER — Telehealth (INDEPENDENT_AMBULATORY_CARE_PROVIDER_SITE_OTHER): Payer: 59 | Admitting: Psychiatry

## 2023-01-29 DIAGNOSIS — F3177 Bipolar disorder, in partial remission, most recent episode mixed: Secondary | ICD-10-CM

## 2023-01-29 DIAGNOSIS — F419 Anxiety disorder, unspecified: Secondary | ICD-10-CM | POA: Diagnosis not present

## 2023-01-29 MED ORDER — EQUETRO 200 MG PO CP12: ORAL_CAPSULE | ORAL | 1 refills | Status: AC

## 2023-01-29 MED ORDER — BUSPIRONE HCL 15 MG PO TABS: ORAL_TABLET | ORAL | 1 refills | Status: AC

## 2023-01-29 MED ORDER — BREXPIPRAZOLE 1 MG PO TABS: 1.0000 mg | ORAL_TABLET | Freq: Every day | ORAL | 1 refills | Status: AC

## 2023-01-29 NOTE — Progress Notes (Signed)
Anitta Senick [id number] Jun 15, 1965 57 y.o.  Virtual Visit via Video Note  I connected with pt @ on 01/29/23 at  9:00 AM EST by a video enabled telemedicine application and verified that I am speaking with the correct person using two identifiers.   I discussed the limitations of evaluation and management by telemedicine and the availability of in person appointments. The patient expressed understanding and agreed to proceed.  I discussed the assessment and treatment plan with the patient. The patient was provided an opportunity to ask questions and all were answered. The patient agreed with the plan and demonstrated an understanding of the instructions.   The patient was advised to call back or seek an in-person evaluation if the symptoms worsen or if the condition fails to improve as anticipated.  I provided 25 minutes of non-face-to-face time during this encounter.  The patient was located at work.  The provider was located at [facility].   Corie Chiquito, PMHNP   Subjective:   Patient ID:  Zaia Vala is a 57 y.o. (DOB [birth year]) female.  Chief Complaint:  Chief Complaint  Patient presents with   Follow-up    Bipolar disorder, Anxiety, sleep disturbance    HPI Krisi Rowden presents for follow-up of anxiety and mood disturbance. She reports that Buspar has "been helpful." She reports, "I don't get as stressed as quickly." She reports some continued worry and rumination "but it's better." She reports falling asleep without difficulty and has difficulty returning to sleep. Sometimes awakened by hot flashes and other nights anxiety prevents her from falling asleep. Sleep quantity varies. Mood has been "ok." She reports some days waking up with low mood and low energy and motivation- "once I get going, I'm usually ok." She reports that she always has some mania this time of year with the holidays. She reports that she has "a little bit" of irritability and this has  been less with Buspar. She reports that concentration has been "pretty good." Appetite has been affected by increase in Semaglutide. She reports some fleeting passive suicidal thoughts.   She reports that work has been stressful at times.   Past Psychiatric Medication Trials: Latuda- Had restless legs on higher doses.  Good response to 60 mg dose Equetro-effective for mood stabilization without visual changes Carbamazepine (generic)-had vision changes and drowsiness Abilify-effective.  Experienced compulsive eating and spending.  Weight gain. Vraylar Saphris Depakote-some weight gain Lithium-took briefly Serzone Trazodone Lamictal-caused insomnia Melatonin  Review of Systems:  Review of Systems  Gastrointestinal:  Positive for abdominal distention. Negative for constipation.       Heartburn  Musculoskeletal:  Negative for gait problem.  Neurological:  Negative for dizziness and light-headedness.  Psychiatric/Behavioral:         Please refer to HPI    Medications: I have reviewed the patient's current medications.  Current Outpatient Medications  Medication Sig Dispense Refill   Semaglutide-Weight Management 1 MG/0.5ML SOAJ Inject 1.7 mg into the skin.     atorvastatin (LIPITOR) 20 MG tablet Take 1 tablet (20 mg total) by mouth daily. 90 tablet 1   brexpiprazole (REXULTI) 1 MG TABS tablet Take 1 tablet (1 mg total) by mouth daily. 90 tablet 1   busPIRone (BUSPAR) 15 MG tablet TAKE 1.5 TABLETS TWICE DAILY 270 tablet 1   CLIMARA PRO 0.045-0.015 MG/DAY Place 1 patch onto the skin once a week.     EQUETRO 200 MG CP12 12 hr capsule TAKE 2 CAPSULES (400 MG TOTAL) BY MOUTH  EVERY MORNING AND 3 CAPSULES (600 MG TOTAL) AT BEDTIME. 450 capsule 1   levothyroxine (SYNTHROID) 50 MCG tablet TAKE 1 TABLET BY MOUTH DAILY BEFORE BREAKFAST 90 tablet 1   Multiple Vitamin (MULTIVITAMIN) tablet Take 1 tablet by mouth daily.     valACYclovir (VALTREX) 1000 MG tablet TAKE 1/2 TABLET BY MOUTH DAILY 45  tablet 1   No current facility-administered medications for this visit.    Medication Side Effects: None  Allergies: No Known Allergies  Past Medical History:  Diagnosis Date   Alcoholism (HCC)    quit 09/23/1999   Allergy    Anemia    ANXIETY 04/21/2009   Qualifier: Diagnosis of   By: Myrtie Hawk, Amy S     Replacing diagnoses that were inactivated after the 05/14/22 regulatory import     Bipolar 1 disorder (HCC)    Borderline personality disorder (HCC)    Family history of heart disease 07/11/2022   Genital herpes 05/29/2016   GERD 04/21/2009   Qualifier: Diagnosis of   By: Monica Becton PA-c, Amy S        Herpes simplex 09/12/2022   daily suppression with valtrex, requests 1000mg  tabs that she cuts in half     Hyperlipidemia 08/19/2017   Hypothyroidism 06/08/2010   Insomnia due to medical condition 04/25/2015   Menopausal hot flushes 04/25/2015   Menorrhagia 09/12/2022   periods 4 days, 2 are heavy changing a pad 2-3 hrs, pt started OCP temporarily and discontinued 04/2015. No period until 12/26/15, then had 3 day period. Pt will monitor and call with problems. 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 Has traumatic history herself. Hospitalized 04/2015 for depression/grief when close client died of overdose. As of 12-26-15 doing much better. Will be attending [facility] "Breakthrough" retreat program for self-care.     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   Aortic aneurysm Half-Brother    Aortic aneurysm Half-Sister    Alcohol abuse Half-Sister    Bipolar disorder Half-Sister    Obesity Half-Sister    Diabetes type II Half-Sister    Breast cancer Niece    Colon cancer Neg Hx    Rectal cancer Neg Hx    Stomach cancer Neg Hx     Social History   Socioeconomic History   Marital status: Married    Spouse name: Not on file   Number of children: Not on file   Years of education: Not on file  Highest education level: Master's degree (e.g., MA, MS, MEng, MEd, MSW, MBA)  Occupational History   Occupation: Horticulturist, commercial  Tobacco Use   Smoking status: Never   Smokeless tobacco: Never  Vaping Use   Vaping status: Never Used  Substance and Sexual Activity   Alcohol use: No   Drug use: No   Sexual activity: Yes    Birth control/protection: None    Comment: husband with vasectomy  Other Topics Concern   Not on file  Social History Narrative   Regular exercise-yes   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    Social Drivers of Corporate investment banker Strain: Low Risk  (07/10/2022)   Overall Financial Resource Strain (CARDIA)    Difficulty of Paying Living Expenses: Not hard at all  Food Insecurity: No Food Insecurity (07/10/2022)   Hunger Vital Sign    Worried About Running Out of Food in the Last Year: Never true    Ran Out of Food in the Last Year: Never true  Transportation Needs: No Transportation Needs (07/10/2022)   PRAPARE - Administrator, Civil Service (Medical): No    Lack of Transportation (Non-Medical): No  Physical Activity: Insufficiently Active  (07/10/2022)   Exercise Vital Sign    Days of Exercise per Week: 4 days    Minutes of Exercise per Session: 30 min  Stress: Stress Concern Present (07/10/2022)   [employer] of Occupational Health - Occupational Stress Questionnaire    Feeling of Stress : To some extent  Social Connections: Moderately Isolated (07/10/2022)   Social Connection and Isolation Panel [NHANES]    Frequency of Communication with Friends and Family: More than three times a week    Frequency of Social Gatherings with Friends and Family: More than three times a week    Attends Religious Services: Never    Database administrator or Organizations: No    Attends Engineer, structural: Not on file    Marital Status: Married  Catering manager Violence: Not on file    Past Medical History, Surgical history, Social history, and Family history were reviewed and updated as appropriate.   Please see review of systems for further details on the patient's review from today.   Objective:   Physical Exam:  LMP 11/29/2015   Physical Exam Neurological:     Mental Status: She is alert and oriented to person, place, and time.     Cranial Nerves: No dysarthria.  Psychiatric:        Attention and Perception: Attention and perception normal.        Mood and Affect: Mood is not depressed.        Speech: Speech normal.        Behavior: Behavior is cooperative.        Thought Content: Thought content normal. Thought content is not paranoid or delusional. Thought content does not include homicidal or suicidal ideation. Thought content does not include homicidal or suicidal plan.        Cognition and Memory: Cognition and memory normal.        Judgment: Judgment normal.     Comments: Insight intact Mood presents as less anxious compared to last exam     Lab Review:     Component Value Date/Time   NA 135 12/28/2022 0921   K 5.0 12/28/2022 0921   CL 102 12/28/2022 0921   CO2 29 12/28/2022 0921   GLUCOSE 97  12/28/2022 0921   BUN 18 12/28/2022 0921   CREATININE 0.65 12/28/2022 0921   CALCIUM 9.5 12/28/2022 0921   PROT 7.3 12/28/2022 0921   ALBUMIN 4.4 10/10/2021 1333   AST 14 12/28/2022 0921   ALT 15 12/28/2022 0921   ALKPHOS 86 10/10/2021 1333   BILITOT 0.3 12/28/2022 0921   GFRNONAA >90 07/11/2013 1511   GFRAA >90 07/11/2013 1511       Component Value Date/Time   WBC 4.6 12/28/2022 0921   RBC 4.08 12/28/2022 0921   HGB 13.6 12/28/2022 0921   HCT 41.2 12/28/2022 0921   PLT 262 12/28/2022 0921   MCV 101.0 (H) 12/28/2022 0921   MCH 33.3 (H) 12/28/2022 0921   MCHC 33.0 12/28/2022 0921   RDW 11.8 12/28/2022 0921   LYMPHSABS 1.7 10/10/2021 1333   MONOABS 0.6 10/10/2021 1333   EOSABS 193 12/28/2022 0921   BASOSABS 41 12/28/2022 0921    No results found for: "POCLITH", "LITHIUM"   Lab Results  Component Value Date   CBMZ 8.0 12/28/2022     .res Assessment: Plan:    28 minutes spent dedicated to the care of this patient on the date of this encounter to include pre-visit review of records, ordering of medication, post visit documentation, and face-to-face time with the patient discussing response to Buspar and option to increase Buspar to target residual anxiety symptoms. Pt agrees to increase in Buspar to 15 mg 1.5 tablets twice daily for anxiety.  Continue Rexulti 1 mg daily for mood symptoms.  Continue Equetro 400 mg in the morning and 600 mg at bedtime for mood stabilization.  Recommend continuing psychotherapy.  Patient advised to contact office with any questions, adverse effects, or acute worsening in signs and symptoms.    Lexani was seen today for follow-up.  Diagnoses and all orders for this visit:  Anxiety disorder, unspecified type -     busPIRone (BUSPAR) 15 MG tablet; TAKE 1.5 TABLETS TWICE DAILY  Bipolar disorder, in partial remission, most recent episode mixed (HCC) -     brexpiprazole (REXULTI) 1 MG TABS tablet; Take 1 tablet (1 mg total) by mouth  daily. -     EQUETRO 200 MG CP12 12 hr capsule; TAKE 2 CAPSULES (400 MG TOTAL) BY MOUTH EVERY MORNING AND 3 CAPSULES (600 MG TOTAL) AT BEDTIME.     Please see After Visit Summary for patient specific instructions.  No future appointments.  No orders of the defined types were placed in this encounter.     -------------------------------

## 2023-02-06 ENCOUNTER — Other Ambulatory Visit: Payer: Self-pay | Admitting: Family

## 2023-02-06 DIAGNOSIS — E785 Hyperlipidemia, unspecified: Secondary | ICD-10-CM

## 2023-03-10 ENCOUNTER — Other Ambulatory Visit: Payer: Self-pay | Admitting: Family

## 2023-03-12 ENCOUNTER — Other Ambulatory Visit (HOSPITAL_BASED_OUTPATIENT_CLINIC_OR_DEPARTMENT_OTHER): Payer: Self-pay
